# Patient Record
Sex: Female | Born: 1965 | Race: Black or African American | Hispanic: No | Marital: Single | Smoking: Current some day smoker
Health system: Southern US, Community
[De-identification: ages and names within clinical notes are randomized; demographics above are authoritative.]

## PROBLEM LIST (undated history)

## (undated) DIAGNOSIS — R5383 Other fatigue: Secondary | ICD-10-CM

## (undated) DIAGNOSIS — R519 Headache, unspecified: Secondary | ICD-10-CM

## (undated) DIAGNOSIS — F419 Anxiety disorder, unspecified: Secondary | ICD-10-CM

## (undated) DIAGNOSIS — R200 Anesthesia of skin: Secondary | ICD-10-CM

## (undated) DIAGNOSIS — R51 Headache: Secondary | ICD-10-CM

## (undated) DIAGNOSIS — R42 Dizziness and giddiness: Secondary | ICD-10-CM

## (undated) DIAGNOSIS — E785 Hyperlipidemia, unspecified: Secondary | ICD-10-CM

## (undated) DIAGNOSIS — Z720 Tobacco use: Secondary | ICD-10-CM

## (undated) DIAGNOSIS — C50919 Malignant neoplasm of unspecified site of unspecified female breast: Secondary | ICD-10-CM

## (undated) DIAGNOSIS — Z9289 Personal history of other medical treatment: Secondary | ICD-10-CM

## (undated) HISTORY — DX: Anesthesia of skin: R20.0

## (undated) HISTORY — DX: Anxiety disorder, unspecified: F41.9

## (undated) HISTORY — DX: Headache, unspecified: R51.9

## (undated) HISTORY — DX: Personal history of other medical treatment: Z92.89

## (undated) HISTORY — DX: Malignant neoplasm of unspecified site of unspecified female breast: C50.919

## (undated) HISTORY — PX: MASTECTOMY: SHX3

## (undated) HISTORY — DX: Hyperlipidemia, unspecified: E78.5

## (undated) HISTORY — DX: Tobacco use: Z72.0

## (undated) HISTORY — PX: CORNEAL TRANSPLANT: SHX108

## (undated) HISTORY — DX: Other fatigue: R53.83

## (undated) HISTORY — PX: EYE SURGERY: SHX253

## (undated) HISTORY — PX: BREAST SURGERY: SHX581

## (undated) HISTORY — DX: Headache: R51

## (undated) HISTORY — DX: Dizziness and giddiness: R42

---

## 1997-10-09 DIAGNOSIS — C50919 Malignant neoplasm of unspecified site of unspecified female breast: Secondary | ICD-10-CM

## 1997-10-09 HISTORY — DX: Malignant neoplasm of unspecified site of unspecified female breast: C50.919

## 1998-05-13 ENCOUNTER — Other Ambulatory Visit: Admission: RE | Admit: 1998-05-13 | Discharge: 1998-05-13 | Payer: Self-pay | Admitting: Obstetrics and Gynecology

## 1998-05-17 ENCOUNTER — Other Ambulatory Visit: Admission: RE | Admit: 1998-05-17 | Discharge: 1998-05-17 | Payer: Self-pay | Admitting: General Surgery

## 1998-05-31 ENCOUNTER — Ambulatory Visit (HOSPITAL_BASED_OUTPATIENT_CLINIC_OR_DEPARTMENT_OTHER): Admission: RE | Admit: 1998-05-31 | Discharge: 1998-05-31 | Payer: Self-pay | Admitting: General Surgery

## 1998-06-08 ENCOUNTER — Inpatient Hospital Stay (HOSPITAL_COMMUNITY): Admission: RE | Admit: 1998-06-08 | Discharge: 1998-06-09 | Payer: Self-pay | Admitting: General Surgery

## 1998-07-07 ENCOUNTER — Ambulatory Visit (HOSPITAL_COMMUNITY): Admission: RE | Admit: 1998-07-07 | Discharge: 1998-07-07 | Payer: Self-pay | Admitting: General Surgery

## 1998-11-30 ENCOUNTER — Other Ambulatory Visit: Admission: RE | Admit: 1998-11-30 | Discharge: 1998-11-30 | Payer: Self-pay | Admitting: Gynecology

## 1999-01-03 ENCOUNTER — Other Ambulatory Visit: Admission: RE | Admit: 1999-01-03 | Discharge: 1999-01-03 | Payer: Self-pay | Admitting: Obstetrics and Gynecology

## 1999-01-04 ENCOUNTER — Ambulatory Visit (HOSPITAL_COMMUNITY): Admission: RE | Admit: 1999-01-04 | Discharge: 1999-01-04 | Payer: Self-pay | Admitting: General Surgery

## 1999-01-13 ENCOUNTER — Ambulatory Visit (HOSPITAL_COMMUNITY): Admission: RE | Admit: 1999-01-13 | Discharge: 1999-01-13 | Payer: Self-pay | Admitting: Obstetrics and Gynecology

## 1999-09-09 ENCOUNTER — Other Ambulatory Visit: Admission: RE | Admit: 1999-09-09 | Discharge: 1999-09-09 | Payer: Self-pay | Admitting: Obstetrics and Gynecology

## 1999-12-15 ENCOUNTER — Encounter (INDEPENDENT_AMBULATORY_CARE_PROVIDER_SITE_OTHER): Payer: Self-pay | Admitting: Specialist

## 1999-12-15 ENCOUNTER — Other Ambulatory Visit: Admission: RE | Admit: 1999-12-15 | Discharge: 1999-12-15 | Payer: Self-pay | Admitting: Obstetrics and Gynecology

## 2000-04-18 ENCOUNTER — Other Ambulatory Visit: Admission: RE | Admit: 2000-04-18 | Discharge: 2000-04-18 | Payer: Self-pay | Admitting: Gynecology

## 2000-08-05 ENCOUNTER — Emergency Department (HOSPITAL_COMMUNITY): Admission: EM | Admit: 2000-08-05 | Discharge: 2000-08-05 | Payer: Self-pay | Admitting: Emergency Medicine

## 2000-09-11 ENCOUNTER — Encounter: Admission: RE | Admit: 2000-09-11 | Discharge: 2000-09-11 | Payer: Self-pay | Admitting: Oncology

## 2000-09-11 ENCOUNTER — Encounter: Payer: Self-pay | Admitting: Oncology

## 2001-03-13 ENCOUNTER — Other Ambulatory Visit: Admission: RE | Admit: 2001-03-13 | Discharge: 2001-03-13 | Payer: Self-pay | Admitting: Gynecology

## 2003-05-08 ENCOUNTER — Encounter: Payer: Self-pay | Admitting: Oncology

## 2003-05-08 ENCOUNTER — Ambulatory Visit (HOSPITAL_COMMUNITY): Admission: RE | Admit: 2003-05-08 | Discharge: 2003-05-08 | Payer: Self-pay | Admitting: Oncology

## 2003-05-13 ENCOUNTER — Encounter: Payer: Self-pay | Admitting: Oncology

## 2003-05-13 ENCOUNTER — Encounter: Admission: RE | Admit: 2003-05-13 | Discharge: 2003-05-13 | Payer: Self-pay | Admitting: Oncology

## 2003-05-19 ENCOUNTER — Encounter (INDEPENDENT_AMBULATORY_CARE_PROVIDER_SITE_OTHER): Payer: Self-pay | Admitting: General Surgery

## 2003-05-19 ENCOUNTER — Encounter (INDEPENDENT_AMBULATORY_CARE_PROVIDER_SITE_OTHER): Payer: Self-pay | Admitting: Specialist

## 2003-05-19 ENCOUNTER — Ambulatory Visit (HOSPITAL_BASED_OUTPATIENT_CLINIC_OR_DEPARTMENT_OTHER): Admission: RE | Admit: 2003-05-19 | Discharge: 2003-05-19 | Payer: Self-pay | Admitting: General Surgery

## 2003-06-04 ENCOUNTER — Ambulatory Visit (HOSPITAL_BASED_OUTPATIENT_CLINIC_OR_DEPARTMENT_OTHER): Admission: RE | Admit: 2003-06-04 | Discharge: 2003-06-05 | Payer: Self-pay | Admitting: General Surgery

## 2003-06-04 ENCOUNTER — Encounter (INDEPENDENT_AMBULATORY_CARE_PROVIDER_SITE_OTHER): Payer: Self-pay | Admitting: *Deleted

## 2003-06-04 ENCOUNTER — Encounter: Payer: Self-pay | Admitting: General Surgery

## 2003-06-18 ENCOUNTER — Ambulatory Visit: Admission: RE | Admit: 2003-06-18 | Discharge: 2003-06-18 | Payer: Self-pay | Admitting: Oncology

## 2003-06-18 ENCOUNTER — Encounter: Payer: Self-pay | Admitting: Cardiology

## 2003-10-31 ENCOUNTER — Emergency Department (HOSPITAL_COMMUNITY): Admission: EM | Admit: 2003-10-31 | Discharge: 2003-11-01 | Payer: Self-pay | Admitting: Emergency Medicine

## 2003-12-14 ENCOUNTER — Encounter: Admission: RE | Admit: 2003-12-14 | Discharge: 2003-12-14 | Payer: Self-pay | Admitting: General Surgery

## 2003-12-15 ENCOUNTER — Ambulatory Visit (HOSPITAL_BASED_OUTPATIENT_CLINIC_OR_DEPARTMENT_OTHER): Admission: RE | Admit: 2003-12-15 | Discharge: 2003-12-15 | Payer: Self-pay | Admitting: General Surgery

## 2003-12-15 ENCOUNTER — Ambulatory Visit (HOSPITAL_COMMUNITY): Admission: RE | Admit: 2003-12-15 | Discharge: 2003-12-15 | Payer: Self-pay | Admitting: General Surgery

## 2004-02-08 ENCOUNTER — Encounter: Admission: RE | Admit: 2004-02-08 | Discharge: 2004-03-23 | Payer: Self-pay | Admitting: General Surgery

## 2004-03-09 ENCOUNTER — Ambulatory Visit: Admission: RE | Admit: 2004-03-09 | Discharge: 2004-03-09 | Payer: Self-pay | Admitting: Cardiology

## 2004-03-09 ENCOUNTER — Encounter (INDEPENDENT_AMBULATORY_CARE_PROVIDER_SITE_OTHER): Payer: Self-pay | Admitting: Cardiology

## 2004-09-12 ENCOUNTER — Other Ambulatory Visit: Admission: RE | Admit: 2004-09-12 | Discharge: 2004-09-12 | Payer: Self-pay | Admitting: Obstetrics and Gynecology

## 2004-10-09 HISTORY — PX: ABDOMINAL HYSTERECTOMY: SHX81

## 2004-10-26 ENCOUNTER — Ambulatory Visit (HOSPITAL_COMMUNITY): Admission: RE | Admit: 2004-10-26 | Discharge: 2004-10-26 | Payer: Self-pay | Admitting: Obstetrics and Gynecology

## 2005-01-09 ENCOUNTER — Encounter: Admission: RE | Admit: 2005-01-09 | Discharge: 2005-04-09 | Payer: Self-pay | Admitting: General Surgery

## 2005-05-29 ENCOUNTER — Ambulatory Visit: Payer: Self-pay | Admitting: Oncology

## 2005-09-04 ENCOUNTER — Ambulatory Visit: Payer: Self-pay | Admitting: Oncology

## 2005-11-10 ENCOUNTER — Ambulatory Visit: Payer: Self-pay | Admitting: Oncology

## 2006-03-15 ENCOUNTER — Ambulatory Visit: Payer: Self-pay | Admitting: Oncology

## 2006-05-28 ENCOUNTER — Ambulatory Visit: Payer: Self-pay | Admitting: Hematology & Oncology

## 2006-07-19 ENCOUNTER — Ambulatory Visit: Payer: Self-pay | Admitting: Oncology

## 2006-09-05 ENCOUNTER — Ambulatory Visit: Payer: Self-pay | Admitting: Oncology

## 2006-10-15 ENCOUNTER — Emergency Department (HOSPITAL_COMMUNITY): Admission: EM | Admit: 2006-10-15 | Discharge: 2006-10-16 | Payer: Self-pay | Admitting: Emergency Medicine

## 2006-10-17 ENCOUNTER — Emergency Department (HOSPITAL_COMMUNITY): Admission: EM | Admit: 2006-10-17 | Discharge: 2006-10-17 | Payer: Self-pay | Admitting: Family Medicine

## 2006-11-15 ENCOUNTER — Ambulatory Visit: Payer: Self-pay | Admitting: Oncology

## 2006-11-20 LAB — CBC WITH DIFFERENTIAL/PLATELET
Eosinophils Absolute: 0.4 10*3/uL (ref 0.0–0.5)
HCT: 40 % (ref 34.8–46.6)
HGB: 14.4 g/dL (ref 11.6–15.9)
LYMPH%: 30.9 % (ref 14.0–48.0)
MONO#: 0.3 10*3/uL (ref 0.1–0.9)
NEUT#: 3.1 10*3/uL (ref 1.5–6.5)
NEUT%: 56.9 % (ref 39.6–76.8)
Platelets: 244 10*3/uL (ref 145–400)
WBC: 5.5 10*3/uL (ref 3.9–10.0)
lymph#: 1.7 10*3/uL (ref 0.9–3.3)

## 2007-02-22 ENCOUNTER — Ambulatory Visit (HOSPITAL_COMMUNITY): Admission: RE | Admit: 2007-02-22 | Discharge: 2007-02-23 | Payer: Self-pay | Admitting: Obstetrics and Gynecology

## 2007-02-22 ENCOUNTER — Encounter (INDEPENDENT_AMBULATORY_CARE_PROVIDER_SITE_OTHER): Payer: Self-pay | Admitting: Obstetrics and Gynecology

## 2007-11-08 ENCOUNTER — Ambulatory Visit: Payer: Self-pay | Admitting: Oncology

## 2007-11-12 LAB — CBC WITH DIFFERENTIAL/PLATELET
Basophils Absolute: 0.1 10*3/uL (ref 0.0–0.1)
Eosinophils Absolute: 0.6 10*3/uL — ABNORMAL HIGH (ref 0.0–0.5)
HCT: 41.5 % (ref 34.8–46.6)
LYMPH%: 35.5 % (ref 14.0–48.0)
MCV: 89.2 fL (ref 81.0–101.0)
MONO%: 5.9 % (ref 0.0–13.0)
NEUT#: 2.7 10*3/uL (ref 1.5–6.5)
NEUT%: 46.4 % (ref 39.6–76.8)
Platelets: 259 10*3/uL (ref 145–400)
RBC: 4.66 10*6/uL (ref 3.70–5.32)

## 2007-11-12 LAB — COMPREHENSIVE METABOLIC PANEL
Alkaline Phosphatase: 89 U/L (ref 39–117)
BUN: 10 mg/dL (ref 6–23)
Creatinine, Ser: 0.94 mg/dL (ref 0.40–1.20)
Glucose, Bld: 89 mg/dL (ref 70–99)
Sodium: 143 mEq/L (ref 135–145)
Total Bilirubin: 0.4 mg/dL (ref 0.3–1.2)

## 2008-01-13 ENCOUNTER — Encounter: Admission: RE | Admit: 2008-01-13 | Discharge: 2008-01-13 | Payer: Self-pay | Admitting: Occupational Medicine

## 2008-01-14 ENCOUNTER — Ambulatory Visit: Payer: Self-pay | Admitting: Oncology

## 2008-01-14 ENCOUNTER — Ambulatory Visit (HOSPITAL_COMMUNITY): Admission: RE | Admit: 2008-01-14 | Discharge: 2008-01-14 | Payer: Self-pay | Admitting: Oncology

## 2008-01-14 LAB — COMPREHENSIVE METABOLIC PANEL
Alkaline Phosphatase: 95 U/L (ref 39–117)
Glucose, Bld: 81 mg/dL (ref 70–99)
Sodium: 138 mEq/L (ref 135–145)
Total Bilirubin: 0.6 mg/dL (ref 0.3–1.2)
Total Protein: 7.1 g/dL (ref 6.0–8.3)

## 2008-10-27 ENCOUNTER — Encounter: Admission: RE | Admit: 2008-10-27 | Discharge: 2008-12-07 | Payer: Self-pay | Admitting: General Surgery

## 2008-11-06 ENCOUNTER — Ambulatory Visit: Payer: Self-pay | Admitting: Oncology

## 2008-11-10 LAB — COMPREHENSIVE METABOLIC PANEL
ALT: 18 U/L (ref 0–35)
AST: 15 U/L (ref 0–37)
Alkaline Phosphatase: 103 U/L (ref 39–117)
BUN: 11 mg/dL (ref 6–23)
Chloride: 106 mEq/L (ref 96–112)
Creatinine, Ser: 1.13 mg/dL (ref 0.40–1.20)
Total Bilirubin: 0.4 mg/dL (ref 0.3–1.2)

## 2008-11-10 LAB — CBC WITH DIFFERENTIAL/PLATELET
BASO%: 0.6 % (ref 0.0–2.0)
Basophils Absolute: 0 10*3/uL (ref 0.0–0.1)
EOS%: 8.2 % — ABNORMAL HIGH (ref 0.0–7.0)
HGB: 13.8 g/dL (ref 11.6–15.9)
LYMPH%: 33.5 % (ref 14.0–48.0)
MONO%: 8.8 % (ref 0.0–13.0)
NEUT#: 2.6 10*3/uL (ref 1.5–6.5)
NEUT%: 48.9 % (ref 39.6–76.8)
Platelets: 216 10*3/uL (ref 145–400)
RBC: 4.35 10*6/uL (ref 3.70–5.32)
WBC: 5.3 10*3/uL (ref 3.9–10.0)
lymph#: 1.8 10*3/uL (ref 0.9–3.3)

## 2008-11-16 ENCOUNTER — Ambulatory Visit: Payer: Self-pay | Admitting: Genetic Counselor

## 2009-11-24 ENCOUNTER — Ambulatory Visit: Payer: Self-pay | Admitting: Genetic Counselor

## 2010-04-20 LAB — CONVERTED CEMR LAB: Pap Smear: NORMAL

## 2010-06-16 ENCOUNTER — Emergency Department (HOSPITAL_COMMUNITY): Admission: EM | Admit: 2010-06-16 | Discharge: 2010-06-16 | Payer: Self-pay | Admitting: Family Medicine

## 2010-06-27 ENCOUNTER — Ambulatory Visit (HOSPITAL_COMMUNITY): Admission: RE | Admit: 2010-06-27 | Discharge: 2010-06-27 | Payer: Self-pay | Admitting: Orthopaedic Surgery

## 2010-08-16 ENCOUNTER — Ambulatory Visit: Payer: Self-pay | Admitting: Family Medicine

## 2010-10-11 ENCOUNTER — Ambulatory Visit
Admission: RE | Admit: 2010-10-11 | Discharge: 2010-10-11 | Payer: Self-pay | Source: Home / Self Care | Attending: Family Medicine | Admitting: Family Medicine

## 2010-10-11 DIAGNOSIS — Z853 Personal history of malignant neoplasm of breast: Secondary | ICD-10-CM | POA: Insufficient documentation

## 2010-10-11 DIAGNOSIS — F17200 Nicotine dependence, unspecified, uncomplicated: Secondary | ICD-10-CM | POA: Insufficient documentation

## 2010-10-12 ENCOUNTER — Telehealth: Payer: Self-pay | Admitting: Family Medicine

## 2010-10-12 LAB — CONVERTED CEMR LAB
AST: 15 units/L (ref 0–37)
Alkaline Phosphatase: 94 units/L (ref 39–117)
BUN: 11 mg/dL (ref 6–23)
Basophils Absolute: 0 10*3/uL (ref 0.0–0.1)
Bilirubin, Direct: 0.1 mg/dL (ref 0.0–0.3)
Calcium: 9.9 mg/dL (ref 8.4–10.5)
Cholesterol: 185 mg/dL (ref 0–200)
Creatinine, Ser: 0.9 mg/dL (ref 0.4–1.2)
Eosinophils Relative: 4.1 % (ref 0.0–5.0)
GFR calc non Af Amer: 88.49 mL/min (ref >60.00)
Glucose, Bld: 59 mg/dL — ABNORMAL LOW (ref 70–99)
HDL: 34.4 mg/dL — ABNORMAL LOW (ref >39.00)
LDL Cholesterol: 126 mg/dL — ABNORMAL HIGH (ref 0–99)
Lymphocytes Relative: 23.8 % (ref 12.0–46.0)
Lymphs Abs: 1.8 10*3/uL (ref 0.7–4.0)
Monocytes Relative: 7.1 % (ref 3.0–12.0)
Neutrophils Relative %: 64.8 % (ref 43.0–77.0)
Platelets: 255 10*3/uL (ref 150.0–400.0)
RDW: 13.3 % (ref 11.5–14.6)
TSH: 0.51 microintl units/mL (ref 0.35–5.50)
Total Bilirubin: 0.6 mg/dL (ref 0.3–1.2)
VLDL: 24.6 mg/dL (ref 0.0–40.0)
WBC: 7.5 10*3/uL (ref 4.5–10.5)

## 2010-10-31 ENCOUNTER — Other Ambulatory Visit: Payer: Self-pay | Admitting: Family Medicine

## 2010-10-31 ENCOUNTER — Encounter (INDEPENDENT_AMBULATORY_CARE_PROVIDER_SITE_OTHER): Payer: Self-pay | Admitting: *Deleted

## 2010-10-31 ENCOUNTER — Ambulatory Visit
Admission: RE | Admit: 2010-10-31 | Discharge: 2010-10-31 | Payer: Self-pay | Source: Home / Self Care | Attending: Family Medicine | Admitting: Family Medicine

## 2010-11-08 NOTE — Assessment & Plan Note (Signed)
Summary: FLU SHOT//PH  .  Other Orders: Admin 1st Vaccine (24401) Flu Vaccine 52yrs + [phone])   Orders Added: 1)  Admin 1st Vaccine [90471] 2)  Flu Vaccine 59yrs + [36644] Flu Vaccine Consent Questions     Do you have a history of severe allergic reactions to this vaccine? no    Any prior history of allergic reactions to egg and/or gelatin? no    Do you have a sensitivity to the preservative Thimersol? no    Do you have a past history of Guillan-Barre Syndrome? no    Do you currently have an acute febrile illness? no    Have you ever had a severe reaction to latex? no    Vaccine information given and explained to patient? yes    Are you currently pregnant? no    Lot Number:[id number]   Exp Date:04/08/2011   Site Given  Left Deltoid IM 2)  Flu Vaccine 53yrs + [03474]   .[email or web address]

## 2010-11-10 NOTE — Letter (Signed)
Summary: [facility]: Immunoassay Fecal Occult Blood (iFOB) Order Form  [facility] [place]  [address] [place], [place] [address]   Phone: [phone]  Fax: [phone]      [facility]: Immunoassay Fecal Occult Blood (iFOB) Order Form   October 31, 2010 MRN: [id number]   VYLA PINT [birth year]   Physicican Name:______Dr Laury Axon, DO___________________  Diagnosis Code:_____V76.51_____________________      Jeremy Johann CMA

## 2010-11-10 NOTE — Assessment & Plan Note (Signed)
Summary: new to est//lch   Vital Signs:  Patient profile:   45 year old female Menstrual status:  hysterectomy Height:      66.5 inches Weight:      123.4 pounds BMI:     19.69 Temp:     98.0 degrees F oral Pulse rate:   76 / minute Pulse rhythm:   regular BP sitting:   92 / 60  (right arm) Cuff size:   regular  Vitals Entered By: Almeta Monas CMA Duncan Dull) (October 11, 2010 10:10 AM) CC: New Est Care--- wants to discuss quitting smoking and wants cholesteroll checked     Menstrual Status hysterectomy Last PAP Result normal   History of Present Illness: Pt here to establish.  She wants to quit smoking but is not ready for chantix.   She would also like her cholesterol checked.     Preventive Screening-Counseling & Management  Alcohol-Tobacco     Alcohol drinks/day: 0     Smoking Status: current     Smoking Cessation Counseling: yes     Smoke Cessation Stage: ready     Packs/Day: 0.5     Year Started: 1988  Caffeine-Diet-Exercise     Does Patient Exercise: no      Drug Use:  no.    Problems Prior to Update: 1)  Preventive Health Care  (ICD-V70.0) 2)  Family History of Aneurysm Aortic  (ICD-V17.4) 3)  Breast Cancer, Hx of  (ICD-V10.3)  Medications Prior to Update: 1)  None  Current Medications (verified): 1)  None  Allergies (verified): 1)  ! 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 Family History of Aneurysm Aortic Family History of Stroke F 1st degree relative <60 Family History Hypertension  Social History: Reviewed history and no changes required. 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 Lungs are clear to auscultation, no crackles or wheezes. Heart:  normal rate and no murmur.   Psych:  Oriented X3, normally interactive, good eye contact, not anxious appearing, and not depressed appearing.     Impression & Recommendations:  Problem # 1:  TOBACCO USE (ICD-305.1)  samples of lozenges and patches given consider therapy for hypnosis Encouraged smoking cessation and discussed different methods for smoking cessation.   Orders: Psychology Referral (Psychology) Tobacco use cessation intermediate 3-10 minutes (57846)  Other Orders: Venipuncture ([address]) TLB-Lipid Panel (80061-LIPID) TLB-BMP (Basic Metabolic Panel-BMET) (80048-METABOL) TLB-CBC Platelet - w/Differential (85025-CBCD) TLB-Hepatic/Liver Function Pnl (80076-HEPATIC) TLB-TSH (Thyroid Stimulating Hormone) (84443-TSH) Specimen Handling (28413)  Patient Instructions: 1)  Rto CPE     Orders Added:  1)  Venipuncture [36415] 2)  TLB-Lipid Panel [80061-LIPID] 3)  TLB-BMP (Basic Metabolic Panel-BMET) [80048-METABOL] 4)  TLB-CBC Platelet - w/Differential [85025-CBCD] 5)  TLB-Hepatic/Liver Function Pnl [80076-HEPATIC] 6)  TLB-TSH (Thyroid  Stimulating Hormone) [84443-TSH] 7)  Specimen Handling [99000] 8)  Psychology Referral [Psychology] 9)  New Patient Level II [99202] 10)  Tobacco use cessation intermediate 3-10 minutes [99406]   Immunization History:  Tetanus/Td Immunization History:    Tetanus/Td:  historical (10/09/2009)   Immunization History:  Tetanus/Td Immunization History:    Tetanus/Td:  Historical (10/09/2009)   PAP Result Date:  04/20/2010 PAP Result:  normal PAP Next Due:  1 yr Mammogram Next Due:  Not Indicated

## 2010-11-10 NOTE — Progress Notes (Signed)
Summary: needs RX for smokng Cessation  Phone Note Call from Patient   Caller: Patient Call For: Loreen Freud DO Details for Reason: Smoking Cessation Summary of Call: call from patient and she stated she needs and Rx sent to the pharmacy for her smoking cessation. Stated she was told it would be free, all she needed was the RX and would like it sent to the [facility]. c/b # [id number].Marland KitchenMarland KitchenMarland KitchenMarland Kitchenplease advise  Initial call taken by: Almeta Monas CMA Duncan Dull),  October 12, 2010 12:04 PM  Follow-up for Phone Call        nicoderm CQ  21 mg patch once daily x6weeks   nicoderm CQ 14 mg patch once daily for 2 weeks then 7 mg patch once daily for 2 weeks   Follow-up by: Loreen Freud DO,  October 12, 2010 12:20 PM  Additional Follow-up for Phone Call Additional follow up Details #1::        rx's have been sent to the pharmacy. Pt made aware and I clarified the RX and advised the patient she will start with the 21mg  Nicoderm and work her way down. Note to pharmacist to hold the other Rx's until patient is ready to pick them up..... Almeta Monas CMA Duncan Dull)  October 12, 2010 1:47 PM' Additional Follow-up by: Almeta Monas CMA Duncan Dull),  October 12, 2010 1:47 PM    New/Updated Medications: NICODERM CQ 21 MG/24HR PT24 (NICOTINE) apply one patch once daily x6 weeks NICODERM CQ 14 MG/24HR PT24 (NICOTINE) apply 1 patch once a day x2weeks NICODERM CQ 7 MG/24HR PT24 (NICOTINE) apply 1 patch once daily x 2weeks Prescriptions: NICODERM CQ 7 MG/24HR PT24 (NICOTINE) apply 1 patch once daily x 2weeks  #14 x 0   Entered by:   Almeta Monas CMA (AAMA)   Authorized by:   Loreen Freud DO   Signed by:   Almeta Monas CMA (AAMA) on 10/12/2010   Method used:   Faxed to ...       [facility]* (retail)       [address]       [address] Shipping/mailing       [place], [place]  [address]       Ph: [phone]       Fax: [phone]   RxID:   [id number] NICODERM CQ 14  MG/24HR PT24 (NICOTINE) apply 1 patch once a day x2weeks  #14 x 0   Entered by:   Almeta Monas CMA (AAMA)   Authorized by:   Loreen Freud DO   Signed by:   Almeta Monas CMA (AAMA) on 10/12/2010   Method used:   Faxed to ...       [facility]* (retail)       [address]       [address] Shipping/mailing       [place], [place]  [address]       Ph: [phone]       Fax: [phone]   RxID:   [id number] NICODERM CQ 21 MG/24HR PT24 (NICOTINE) apply one patch once daily x6 weeks  #42 x 0   Entered by:   Almeta Monas CMA (AAMA)   Authorized by:   Loreen Freud DO   Signed by:   Almeta Monas CMA (AAMA) on 10/12/2010   Method used:   Faxed to ...       [facility]* (retail)  [address]       [address] Shipping/mailing       [place], [place]  [address]       Ph: [phone]       Fax: [phone]   RxID:   [id number]

## 2010-11-22 ENCOUNTER — Ambulatory Visit (INDEPENDENT_AMBULATORY_CARE_PROVIDER_SITE_OTHER): Payer: Commercial Managed Care - PPO | Admitting: Family Medicine

## 2010-11-22 ENCOUNTER — Encounter: Payer: Self-pay | Admitting: Family Medicine

## 2010-11-22 DIAGNOSIS — R109 Unspecified abdominal pain: Secondary | ICD-10-CM

## 2010-11-22 DIAGNOSIS — G43909 Migraine, unspecified, not intractable, without status migrainosus: Secondary | ICD-10-CM

## 2010-11-22 DIAGNOSIS — N390 Urinary tract infection, site not specified: Secondary | ICD-10-CM

## 2010-11-22 LAB — CONVERTED CEMR LAB
Blood in Urine, dipstick: NEGATIVE
Ketones, urine, test strip: NEGATIVE
Nitrite: NEGATIVE
Protein, U semiquant: NEGATIVE
pH: 6.5

## 2010-11-23 ENCOUNTER — Encounter: Payer: Self-pay | Admitting: Family Medicine

## 2010-11-30 NOTE — Assessment & Plan Note (Signed)
Summary: back pain/cbs   Vital Signs:  Patient profile:   45 year old female Menstrual status:  hysterectomy Weight:      125.0 pounds Temp:     98.3 degrees F oral BP sitting:   112 / 72  (right arm) Cuff size:   regular  Vitals Entered By: Almeta Monas CMA Duncan Dull) (November 22, 2010 3:39 PM) CC: x few days c/o pain to the left side of the lower back with some redness--worst when water hits it    History of Present Illness: Pt here c/o tenderness L side.  It hurts with light touch.  No urinary symptoms.    Current Medications (verified): 1)  Nicoderm Cq 21 Mg/24hr Pt24 (Nicotine) .... Apply One Patch Once Daily X6 Weeks 2)  Nicoderm Cq 14 Mg/24hr Pt24 (Nicotine) .... Apply 1 Patch Once A Day X2weeks 3)  Nicoderm Cq 7 Mg/24hr Pt24 (Nicotine) .... Apply 1 Patch Once Daily X 2weeks 4)  Macrobid 100 Mg Caps (Nitrofurantoin Monohyd Macro) .Marland Kitchen.. 1 By Mouth Two Times A Day 5)  Valtrex 1 Gm Tabs (Valacyclovir Hcl) .Marland Kitchen.. 1 By Mouth Three Times A Day 6)  Vicodin 5-500 Mg Tabs (Hydrocodone-Acetaminophen) .Marland Kitchen.. 1-2 By Mouth Every 6 Hours As Needed  Allergies (verified): 1)  ! 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 Family  History of Aneurysm Aortic Family History of Stroke F 1st degree relative <60 Family History Hypertension  Social History: Reviewed history from 10/11/2010 and no changes required. 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   Neurologic:  alert & oriented X3, gait normal, and DTRs symmetrical and normal.   Skin:  no rash Psych:  Oriented X3 and normally interactive.     Impression & Recommendations:  Problem # 1:  UTI (ICD-599.0)  Her updated medication list for this problem includes:    Macrobid 100 Mg Caps (Nitrofurantoin monohyd macro) .Marland Kitchen... 1 by mouth two times a day  Orders: Specimen Handling (47829) T-Culture, Urine (56213-08657) UA Dipstick w/o Micro (manual) (81002)  Encouraged to push clear liquids, get enough rest, and take acetaminophen as needed. To be seen in 10 days if no improvement, sooner if worse.  Problem # 2:  FLANK PAIN, LEFT (ICD-789.09)  ? shingles---rx valtrex Her updated medication list for this problem includes:    Vicodin 5-500 Mg Tabs (Hydrocodone-acetaminophen) .Marland Kitchen... 1-2 by mouth every 6 hours as needed  Discussed use of medications, application of heat or cold, and exercises.   Orders: UA Dipstick w/o Micro (manual) (84696)  Complete Medication List: 1)  Nicoderm Cq 21 Mg/24hr Pt24 (Nicotine) .... Apply one patch once daily x6 weeks 2)  Nicoderm Cq 14  Mg/24hr Pt24 (Nicotine) .... Apply 1 patch once a day x2weeks 3)  Nicoderm Cq 7 Mg/24hr Pt24 (Nicotine) .... Apply 1 patch once daily x 2weeks 4)  Macrobid 100 Mg Caps (Nitrofurantoin monohyd macro) .Marland Kitchen.. 1 by mouth two times a day 5)  Valtrex 1 Gm Tabs (Valacyclovir hcl) .Marland Kitchen.. 1 by mouth three times a day 6)  Vicodin 5-500  Mg Tabs (Hydrocodone-acetaminophen) .Marland Kitchen.. 1-2 by mouth every 6 hours as needed Prescriptions: VICODIN 5-500 MG TABS (HYDROCODONE-ACETAMINOPHEN) 1-2 by mouth every 6 hours as needed  #30 x 0   Entered and Authorized by:   Loreen Freud DO   Signed by:   Loreen Freud DO on 11/22/2010   Method used:   Print then Give to Patient   RxID:   [id number] VALTREX 1 GM TABS (VALACYCLOVIR HCL) 1 by mouth three times a day  #30 x 0   Entered and Authorized by:   Loreen Freud DO   Signed by:   Loreen Freud DO on 11/22/2010   Method used:   Electronically to        [facility]* (retail)       [address]       [address] Shipping/mailing       [place], [place]  [address]       Ph: [phone]       Fax: [phone]   RxID:   [id number] MACROBID 100 MG CAPS (NITROFURANTOIN MONOHYD MACRO) 1 by mouth two times a day  #14 x 0   Entered and Authorized by:   Loreen Freud DO   Signed by:   Loreen Freud DO on 11/22/2010   Method used:   Print then Give to Patient   RxID:   [id number]    Orders Added: 1)  Specimen Handling [99000] 2)  T-Culture, Urine [16010-93235] 3)  Est. Patient Level III [57322] 4)  UA Dipstick w/o Micro (manual) [81002]    Laboratory Results   Urine Tests   Date/Time Reported: November 22, 2010 4:10 PM   Routine Urinalysis   Color: yellow Appearance: Clear Glucose: negative   (Normal Range: Negative) Bilirubin: negative   (Normal Range: Negative) Ketone: negative   (Normal Range: Negative) Spec. Gravity: <1.005   (Normal Range: 1.003-1.035) Blood: negative   (Normal Range: Negative) pH: 6.5   (Normal Range: 5.0-8.0) Protein: negative   (Normal Range: Negative) Urobilinogen: 0.2   (Normal Range: 0-1) Nitrite: negative   (Normal Range: Negative) Leukocyte Esterace: large   (Normal Range: Negative)

## 2011-02-03 ENCOUNTER — Encounter: Payer: Self-pay | Admitting: Family Medicine

## 2011-02-07 ENCOUNTER — Encounter: Payer: Self-pay | Admitting: Family Medicine

## 2011-02-07 ENCOUNTER — Ambulatory Visit (INDEPENDENT_AMBULATORY_CARE_PROVIDER_SITE_OTHER): Payer: Commercial Managed Care - PPO | Admitting: Family Medicine

## 2011-02-07 DIAGNOSIS — G43909 Migraine, unspecified, not intractable, without status migrainosus: Secondary | ICD-10-CM

## 2011-02-07 MED ORDER — CYCLOBENZAPRINE HCL 10 MG PO TABS: 10.0000 mg | ORAL_TABLET | Freq: Three times a day (TID) | ORAL | Status: AC | PRN

## 2011-02-07 MED ORDER — CYCLOBENZAPRINE HCL 10 MG PO TABS: 10.0000 mg | ORAL_TABLET | Freq: Three times a day (TID) | ORAL | Status: DC | PRN

## 2011-02-07 NOTE — Progress Notes (Signed)
  Subjective:    [name] is a 45 y.o. female who presents for evaluation of headache. Symptoms began about 9 days ago. Generally, the headaches last about 2 hours and occur every day. The headaches do not seem to be related to any time of the day. The headaches are usually moderate and dull and are located in back of the head.  The patient rates her most severe headaches a 6 on a scale from 1 to 10. Recently, the headaches have been stable. Work attendance or other daily activities are not affected by the headaches. Precipitating factors include: none which have been determined. The headaches are usually not preceded by an aura. Associated neurologic symptoms: none. The patient denies decreased physical activity, depression, dizziness, loss of balance, muscle weakness, numbness of extremities, speech difficulties, vision problems, vomiting in the early morning and worsening school/work performance. Home treatment has included ibuprofen, hydrocodone, darkening the room, resting and sleeping with some improvement. Other history includes: nothing pertinent. Family history includes stroke and aneurysm.  The following portions of the patient's history were reviewed and updated as appropriate: allergies, current medications, past family history, past medical history, past social history, past surgical history and problem list.  Review of Systems Pertinent items are noted in HPI.    Objective:    BP 112/76  Pulse 96  Wt 124 lb 12.8 oz (56.609 kg) General appearance: alert, cooperative, appears stated age and mild distress Head: Normocephalic, without obvious abnormality, atraumatic Eyes: conjunctivae/corneas clear. PERRL, EOM's intact. Fundi benign. Ears: normal TM's and external ear canals both ears Nose: Nares normal. Septum midline. Mucosa normal. No drainage or sinus tenderness. Neck: no adenopathy, no carotid bruit, no JVD, supple, symmetrical, trachea midline and thyroid not enlarged,  symmetric, no tenderness/mass/nodules Lungs: clear to auscultation bilaterally Heart: regular rate and rhythm, S1, S2 normal, no murmur, click, rub or gallop Extremities: extremities normal, atraumatic, no cyanosis or edema Pulses: 2+ and symmetric Lymph nodes: Cervical, supraclavicular, and axillary nodes normal. Neurologic: Grossly normal    Assessment:    Classic migraine    Plan:    Lie in darkened room and apply cold packs as needed for pain. Side effect profile discussed in detail. Asked to keep headache diary. Patient reassured that neurodiagnostic workup not indicated from benign H&P. Neurodiagnostic workup: ESR to r/o temporal arteritis and MRI to r/o aneurysm. Follow up in 1 month.

## 2011-02-07 NOTE — Patient Instructions (Signed)
Headache, General, Without Cause   The specific cause of your headache may not have been found today. There are many causes and types of headache. A few common ones are:   Tension headache.   Migraine.  Infections (examples: dental and sinus infections).   Bone and/or joint problems in the neck or jaw.  Depression.   Eye problems.    These headaches are not life threatening.   Headaches can sometimes be diagnosed by a patient history and a physical exam. Sometimes, lab and imaging studies (such as x-ray and/or CT scan) are used to rule out more serious problems. In some cases, a spinal tap (lumbar puncture) may be requested. There are many times when your exam and tests may be normal on the first visit even when there is a serious problem causing your headaches. Because of that, it is very important to follow up with your doctor or local clinic for further evaluation.   HOME CARE INSTRUCTIONS   Keep follow-up appointments with your caregiver, or any specialist referral.   Only take over-the-counter or prescription medicines for pain, discomfort, or fever as directed by your caregiver.   Biofeedback, massage, or other relaxation techniques may be helpful.   Ice packs or heat applied to the head and neck can be used. Do this three to four times per day, or as needed.   Call your doctor if you have any questions or concerns.   If you smoke, you should quit.   FINDING OUT THE RESULTS OF TESTS   If a radiology test was performed, a radiologist will review your results.   You will be contacted by the emergency department or your physician if any test results require a change in your treatment plan.   Not all test results may be available during your visit. If your test results are not back during the visit, make an appointment with your caregiver to find out the results. Do not assume everything is normal if you have not heard from your caregiver or the medical facility. It is important for you to follow up on all of your  test results.   SEEK MEDICAL CARE IF:   You develop problems with medications prescribed.   You do not respond to or obtain relief from medications.   You have a change from the usual headache.   You develop nausea or vomiting.   SEEK IMMEDIATE MEDICAL CARE IF:   If your headache becomes severe.   You have an unexplained oral temperature above 100.4, or as your caregiver suggests.   You have a stiff neck.   You have loss of vision.   You have muscular weakness.   You have loss of muscular control.   You develop severe symptoms different from your first symptoms.   You start losing your balance or have trouble walking.   You feel faint or pass out.   MAKE SURE YOU:   Understand these instructions.   Will watch your condition.   Will get help right away if you are not doing well or get worse.   Document Released: 09/25/2005 Document Re-Released: 09/07/2008   [employer]® Patient Information ©2011 [employer].

## 2011-02-09 NOTE — Progress Notes (Signed)
  Subjective:    Patient ID: [name], female    DOB: 1966-08-29, 45 y.o.   MRN: [id number]  HPI    Review of Systems     Objective:   Physical Exam        Assessment & Plan:

## 2011-02-14 ENCOUNTER — Ambulatory Visit (HOSPITAL_COMMUNITY)
Admission: RE | Admit: 2011-02-14 | Discharge: 2011-02-14 | Disposition: A | Discharge status: Home / Self Care | Payer: Commercial Managed Care - PPO | Source: Ambulatory Visit | Attending: Family Medicine | Admitting: Family Medicine

## 2011-02-14 ENCOUNTER — Ambulatory Visit (HOSPITAL_COMMUNITY): Admission: RE | Admit: 2011-02-14 | Payer: Commercial Managed Care - PPO | Source: Ambulatory Visit

## 2011-02-14 ENCOUNTER — Other Ambulatory Visit: Payer: Self-pay | Admitting: Family Medicine

## 2011-02-14 DIAGNOSIS — R51 Headache: Secondary | ICD-10-CM | POA: Insufficient documentation

## 2011-02-14 DIAGNOSIS — C50919 Malignant neoplasm of unspecified site of unspecified female breast: Secondary | ICD-10-CM | POA: Insufficient documentation

## 2011-02-14 MED ORDER — GADOBENATE DIMEGLUMINE 529 MG/ML IV SOLN
10.0000 mL | Freq: Once | INTRAVENOUS | Status: AC
  Administered 2011-02-14: 10 mL via INTRAVENOUS

## 2011-02-15 ENCOUNTER — Encounter: Payer: Self-pay | Admitting: *Deleted

## 2011-02-21 NOTE — Op Note (Signed)
NAME:  [name], [name] NO.:  [id number]   MEDICAL RECORD NO.:  [id number]          PATIENT TYPE:  AMB   LOCATION:  DAY                          FACILITY:  [facility]   PHYSICIAN:  Crist Fat. Rivard, M.D. DATE OF BIRTH:  [birth year]   DATE OF PROCEDURE:  02/22/2007  DATE OF DISCHARGE:                               OPERATIVE REPORT   PREOPERATIVE DIAGNOSIS:  Dysfunctional uterine bleeding with recurrent  breast cancer.   POSTOPERATIVE DIAGNOSES:  1. Dysfunctional uterine bleeding with recurrent breast cancer.  2. Pelvic adhesions and uterine fibroids.   ANESTHESIA:  General, Dr. Leta Jungling.   PROCEDURE:  Robot-assisted hysterectomy with bilateral salpingo-  oophorectomy and lysis of adhesions.   SURGEON:  Crist Fat. Rivard, M.D.   ASSISTANTMarquis Lunch. Zenia Resides BLOOD LOSS:  Minimal.   PROCEDURE:  After being informed of the planned procedure with possible  complications including bleeding, infection, injury to bowel, bladder or  ureters, need for laparotomy, as well as the procedure length, hospital  stay, and expected recovery, informed consent is obtained.  The patient  is taken to OR #10 and given general anesthesia with endotracheal  intubation without complication.  She is placed in the lithotomy  position, prepped and draped in a sterile fashion. Sequential  compression devices,knee-high, are in place. Bothh arms are padded and  tucked. Her chest is secured to the table. She is placed on a sticky  pad.   Her pelvic exam reveals a retroverted uterus with a posterior fibroid,  mobile, about 8 weeks size.  Both adnexa are felt and normal.  A  weighted speculum is inserted.  The anterior lip of the cervix is  grasped with a tenaculum forceps, and the uterus is sounded at 9.5 cm.  The cervix is easily dilated with Hegar dilator until No. 25 which  allows easy entry of a 8-cm RUMI intrauterine manipulator.  This is  placed as well as with a 3-cm  Koh-ring and a vaginal occluder.  The RUMI  balloon is inflated with 5 mL of saline.  The Koh-ring is then sutured  to the cervix with 0 Vicryl and a tenaculum is still kept in place on  the cervix for retraction during surgery.  A Foley catheter is then  inserted in a sterile fashion.   We proceed with a supraumbilical 10-mm incision about 5 cm above the  umbilicus after infiltrating with 5 mL of Marcaine 0.25, and we bring  this incision down to the fascia.  The fascia is identified, grasped  with Kocher forceps, incised, and peritoneum is entered bluntly.  A  pursestring suture of 0 Vicryl is placed on the fascia, and a 10-mm  Hassan trocar is easily inserted in the abdominal cavity.  This trocar  is held in place with the previously placed pursestring suture.  We are  now able to insufflate a pneumoperitoneum with CO2 at a maximum pressure  of 15 mmHg.  Camera is inserted to evaluate the abdominal pelvic cavity.  We see adhesions covering the posterior aspect of the uterus.  Overall  appears to be of normal size.  Both adnexa are in a pelvic adhesion  process and unable to visualize at this time.  The appendix is  visualized and normal.  No other disease is identified.  We do see a  small adhesion between the cecum and the right pelvic wall which will  need to be sectioned for insertion of our patient assistant trocar.  Placement of trocars is then decided.  All sites are infiltrated with 3  mL of Marcaine 0.25, and we place under direct visualization two 8-mm  robotic trocars on the left and one 8-mm robotic trocar on the right as  well as the patient side 10-mm trocar after we are able to sharply free  the adhesions previously mentioned.  We can now dock the robot and  proceed with her hysterectomy.   Using a tenaculum forceps in arm #3, a PK Gyrus forceps in arm #2, and a  monopolar scissors in arm #1, we proceed with systematic dissection.  We  start with the right aspect of the  pelvis, and we are able to identify  the ureter easily and isolate the infundibulopelvic ligament on the  right and cauterize it with PK Gyrus energy.  This ligament is then  sectioned which frees the adnexa completely.  With traction on the  adnexa, we are now able to free posterior cul-de-sac and right pelvic  wall grade 1 and 2 adhesions.  We can now cauterize the round ligament  on the right with PK energy and section it which gives [place] easy entry to  the anterior and posterior sheath of the broad ligament.  The anterior  sheath is then incised with scissors which allows [place] to bluntly retract  bladder by developing a bladder flap.  At this time of the surgery,  adhesions with the lower uterine segment are noted due to previous  cesarean section, and we proceed systematically with blunt and sharp  dissection until we are able to see the vaginal fornix with pressure on  the Koh-ring being applied from down below.  The posterior sheath of the  broad ligament is then sharply dissected down, and we then proceed with  skeletonization of the uterine artery on that side.  In the dissection  process, the ureter is completely dissected with the retroperitoneum,  and we have visualization of the ureter on its whole length.  We then go  towards the left, and again we have to sharply dissect pelvic adhesions  so we can free the adnexa from the pelvic wall and the posterior cul-de-  sac as well.  The ureter is identified easily, and with it under direct  visualization, we can grasp and cauterize the infundibulopelvic ligament  on the left and section this ligament to free the adnexa entirely.  The  round ligament is then cauterized and sectioned, and the anterior sheath  of the broad ligament is incised.  The posterior sheath of the broad  ligament is then sharply dissected down, and again the retroperitoneal space is partially dissected.  We proceed with systematic dissection of  the bladder away  from the vaginal fornix as well as systematic  skeletonization of the uterine artery bundle.   We can now using PK energy cauterize the uterine artery in its ascending  branch at the vaginal corner on both sides and section these pedicles.  With pressure applied on the Koh-ring , we open the anterior vaginal  fornix with monopolar energy and continue in a circumferential  manner  until the whole uterus is removed entirely with its two adnexa.  At this  time we have a much better view of the posterior fibroid which appears  to be about 5 cm in the lower uterine segment.  In order to proceed with  the posterior cul-de-sac opening, this fibroid has to be pulled up  slightly.  Moving of the uterus for the final part of the procedure is  done using the tenaculum forceps.  The uterus is then delivered entirely  through the vagina with its two adnexa.  The vaginal occluder is  inflated and reinserted in the vagina.  Hemostasis is adequate.  Instruments are changed for two needle holders, and we proceed with  closure of the vaginal vault with figure-of-eight stitches of 0 Vicryl.  Hemostasis is adequate at the vaginal vault.  We then proceed with  profuse irrigation with warm saline of the pelvis, and we complete  hemostasis on the dissection area of the bladder using PK energy.  Both  ureters are well visualized and normal with normal peristaltism and away  from our sites of cauterization.  Hemostasis is deemed adequate.  A  sheet of Interceed is cut in three, and two parts are used to cover the  vaginal vault and the right pelvic wall dissection.  Instruments are  then removed.  The robot is undocked.  We then remove all trocars under  direct visualization after evacuation of the pneumoperitoneum.   The fascia of the supraumbilical incision is closed with the previously  placed pursestring suture of 0 Vicryl.  The patient right side 10-mm  trocar fascia is then identified, grasped with Kocher  forceps, closed  with a figure-of-eight stitch of 0 Vicryl.  All skin incisions are  closed with subcuticular suture of 3-0 Monocryl and Steri-Strips.   The vaginal occluder is removed.   Instrument and sponge count is complete x2.  Estimated blood loss is  minimal.  Procedure is very well tolerated by the patient who is taken  to recovery room in a well and stable condition.      Crist Fat Rivard, M.D.  Electronically Signed     SAR/MEDQ  D:  02/22/2007  T:  02/22/2007  Job:  [id number]

## 2011-02-21 NOTE — H&P (Signed)
NAME:  [name], [name] NO.:  [id number]   MEDICAL RECORD NO.:  [id number]          PATIENT TYPE:  EMS   LOCATION:  URG                          FACILITY:  [facility]   PHYSICIAN:  Crist Fat. Rivard, M.D. DATE OF BIRTH:  1966/06/18   DATE OF ADMISSION:  10/17/2006  DATE OF DISCHARGE:  10/17/2006                              HISTORY & PHYSICAL   HISTORY OF PRESENT ILLNESS:  [name] is a 45 year old African-  American female, para 1-0-1-1, who presents for a robot-assisted  hysterectomy with bilateral salpingo-oophorectomy, because of  dysfunctional uterine bleeding, and a history of recurrent breast  cancer.  For the past 4 years, since completing chemotherapy, patient  gives a history of intermittent menstrual flow, gives a history of an  intermittent menstrual flow lasting from 1 day to 3 months.  She may  require hourly pad changes with her bleeding (which soils her clothing),  or perhaps only changes one pad daily.  The timing of this bleeding is  also very unpredictable and often accompanied by clots.  She has been  prescribed Depo-Provera 150 mg every 12 weeks for 2 years; however, she  has not had any relief of her symptoms.  She denies any dyspareunia,  changes in bowel habits, urinary tract symptoms, vaginitis symptoms or  fever.  A pelvic ultrasound in March of 2008 showed a uterus measuring 8  cm x 6.8 cm x 5.8 cm, with a posterior fibroid measuring 3.87 x 3.18 x  3.70 cm.  There was a simple left ovarian cyst measuring 2.3 cm, and two  hyperechoic masses in the endometrium, measuring 0.59 cm and 0.60 cm  respectively.  The patient's right ovary appeared normal on the study.  An endometrial biopsy done at the same time returned benign endometrium  with exogenous hormone effect.  There were no hyperplasia identified.  Given the disruptive nature of her bleeding, limited treatment options  and history of recurrent breast cancer, patient has decided to  proceed  with definitive therapy of her symptoms in the form of hysterectomy with  bilateral salpingo-oophorectomy.   PAST MEDICAL HISTORY:  OB history:  Gravida 2, para 1-0-1-1.  Patient  had a cesarean section in 1986.  GYN history:  Menarche 45 years old.  Last menstrual period Feb 11, 2007.  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  Her last normal Pap smear was February 2008.   MEDICAL HISTORY:  Breast cancer on two occasions (1999 and 2004),  anemia.   SURGICAL HISTORY:  1999, right breast mastectomy; 2005 left breast  mastectomy.  Denies any problems with anesthesia or history of blood  transfusions.   FAMILY HISTORY:  Hypertension, diabetes mellitus.   SOCIAL HISTORY:  Patient works as [employer] as a [employer].  She is currently separated.   HABITS:  She smokes 1/4 pack of cigarettes per day.  She did not consume  alcohol nor elicit drugs.  Currently not taking any medication.   ALLERGIES:  SHE IS ALLERGIC TO PENICILLIN  AND CODEINE, BOTH OF WHICH  CAUSES HER TO ITCH.   REVIEW OF SYSTEMS:  The patient wears glasses.  She does have occasional  vaginal dryness but denies any chest pain, shortness of breath,  headache, vision changes or pelvic pain, and except as is mentioned in  history of present illness, patient's review of systems is negative.   PHYSICAL EXAMINATION:  VITAL SIGNS:  Blood pressure 110/80, weight is  123, height is 5 feet 6 inches tall.  GENERAL:  Neck is supple without masses.  There is no thyromegaly or  cervical adenopathy.  HEART:  Regular rate and rhythm.  There is no murmur.  LUNGS:  Clear.  There are no wheezes, rales or rhonchi.  BACK:  No CVA tenderness.  ABDOMEN:  Without tenderness, guarding, rebound or organomegaly.  There  are no palpable masses.  EXTREMITIES:  No clubbing, cyanosis or edema.  PELVIC:  EGBUS  is within normal limits.  Vagina is rugose.  Cervix is  nontender without lesions.  Uterus is retroverted, appears normal size,  shape and consistency without tenderness.  Adnexa:  No tenderness or  masses.  Rectovaginal confirms.   IMPRESSION:  1. Dysfunctional uterine bleeding.  2. Status post recurrent bilateral breast cancer.   DISPOSITION:  A discussion was held with patient regarding the  indication for her procedure, along with it's risks which include but  are not limited to reaction to anesthesia, damage to adjacent organs,  infection, bleeding and the possibility that an abdominal incision may  be required to complete her surgery.  Additionally, patient was further  advised, and she verbalized understanding regarding the length of her  robotic hysterectomy procedure.  Her expected recovery could take 2 to 3  weeks, and that she will experience transient facial edema  postoperatively.  Patient was given a MiraLax bowel prep to be used 24  hours prior to her scheduled surgery.  Patient has consented to proceed  with a robot-assisted hysterectomy with bilateral salpingo-oophorectomy  on Feb 22, 2007 at 7:30 a.m. at [facility].      [name].      Crist Fat Rivard, M.D.  Electronically Signed    EJP/MEDQ  D:  02/19/2007  T:  02/19/2007  Job:  [id number]

## 2011-02-24 NOTE — Op Note (Signed)
NAME:  [name], [name]                           ACCOUNT NO.:  [id number]   MEDICAL RECORD NO.:  [id number]                   PATIENT TYPE:  AMB   LOCATION:  DSC                                  FACILITY:  [facility]   PHYSICIAN:  Adolph Pollack, M.D.            DATE OF BIRTH:  1966-04-25   DATE OF PROCEDURE:  05/19/2003  DATE OF DISCHARGE:                                 OPERATIVE REPORT   PREOPERATIVE DIAGNOSIS:  Left breast masses.   POSTOPERATIVE DIAGNOSIS:  Left breast masses.   OPERATION PERFORMED:  Excision of left breast masses.   SURGEON:  Adolph Pollack, M.D.   ANESTHESIA:  General.   INDICATIONS FOR PROCEDURE:  [name] is a 45 year old female with a history  of invasive right breast cancer.  She noted some painful areas in the upper  outer quadrant of the left breast at the junction of the axillary area.  These were solid masses by ultrasound.  She now presents for excision of  these.  The procedure and the risks including but not limited to bleeding,  infection, accidental nerve injury were explained to her preoperatively.   DESCRIPTION OF PROCEDURE:  She was seen in the holding area and the left  chest was marked.  She was then brought to the operating room and placed  supine on the operating table and the masses were marked with a marking pen.  General anesthetic was administered. The left breast and axillary area were  sterilely prepped and draped.  In the upper outer quadrant, a curvilinear  incision was made directly over the masses after instilling dilute Marcaine  in the subcutaneous and subdermal and dermal tissues.  I carried this down  to the subcutaneous fat.  Subsequently, I raised some skin flaps in all  directions.  I was able to palpate the masses and they were adherent to the  chest wall.  I then excised these masses sharply and also excised some  muscle with it.  There was also normal breast tissue around the area.  I  sent this specimen fresh  to pathology.   Next, I inspected the wound.  There was a little bit of bleeding from muscle  edges that I controlled with the cautery.  I then injected more local  anesthetic into the wound bed and irrigated the wound also with it.  Once  hemostasis was adequate, I closed the wound by loosely approximating the  subcutaneous tissue with interrupted 2-0 Vicryl sutures and closed the skin  with 4-0 Monocryl subcuticular stitch.  Steri-Strips and sterile dressings  were applied.   The patient tolerated the procedure well without any apparent complications  and was taken to the recovery room in satisfactory condition.  She will be  given postoperative instructions and a prescription for Vicodin for pain and  follow up with me in about a week in the office.  Adolph Pollack, M.D.    [name]  D:  05/19/2003  T:  05/19/2003  Job:  [id number]   cc:   Genene Churn. Cyndie Chime, M.D.  [address] [facility]  [place]  [place] [address]  Fax: [phone]   Dr. Cheral Marker

## 2011-02-24 NOTE — Op Note (Signed)
NAME:  [name], [name]                           ACCOUNT NO.:  [id number]   MEDICAL RECORD NO.:  [id number]                   PATIENT TYPE:  AMB   LOCATION:  DSC                                  FACILITY:  [facility]   PHYSICIAN:  Adolph Pollack, M.D.            DATE OF BIRTH:  [birth year]   DATE OF PROCEDURE:  06/04/2003  DATE OF DISCHARGE:                                 OPERATIVE REPORT   PREOPERATIVE DIAGNOSIS:  Left breast cancer.   POSTOPERATIVE DIAGNOSIS:  Left breast cancer.   OPERATION PERFORMED:  1. Left modified radical mastectomy.  2. Insertion of single lumen Port-A-Cath to right internal jugular vein with     C-arm guidance.   SURGEON:  Adolph Pollack, M.D.   ANESTHESIA:  General.   INDICATIONS FOR PROCEDURE:  [name] is a 45 year old female with a  previous history of right breast cancer.  She had a right modified radical  mastectomy in the past.  She had an upper outer quadrant small lesion noted  and biopsy demonstrated invasive cancer.  It is very close to the axilla.  She now presents for the above procedures.   DESCRIPTION OF PROCEDURE:  She had an IV placed in her foot and was brought  to the operating room, placed supine on the operating table and a general  anesthetic was administered.  The left chest and axillary area were  sterilely prepped and draped.  An elliptical incision was made in an oblique  direction to include the old biopsy site incising the skin and dermis.  Subsequently, I used breast hooks and raised skin flaps superiorly to the  clavicle, medially to the sternum, inferiorly to the anterior rectus sheath  and laterally to the latissimus muscle. I then used the cautery to dissect  the breast free from the underlying pectoralis muscle and fascia.  When I  came to the point where the previous biopsy site was, I did excise muscle as  the margins had been reported to be positive.  I then retracted the breast  laterally and using sharp  dissection incised the clavipectoral fascia and  entered the axillary area.  The axillary vein was identified.  Using blunt  dissection, I swept the axillary fatty tissue and lymph nodes inferiorly.  I  clipped small vessels and divided them.  I then identified the long thoracic  nerve and the thoracodorsal nerves and tested both these.  They were  functional.  Using some blunt dissection and clipping small vessels, I  removed all of the lymphatics and fatty tissue in between these two nerves.  I did have to divide two intercostal brachial cutaneous nerves.  Once this  was done and the nerves were out of the plain of dissection, I then used the  cautery to excise the rest of the axillary contents and the breast and  handed the specimen off the field.  I once  tested the nerves and they were  both functional.   Next, I irrigated the wound with saline solution.  Bleeding from the chest  wall was controlled with cautery.  The axillary area was hemostatic.  I then  placed two stab incisions in the inferior flap and placed one 19 Blake drain  into to the axilla and the other one inferior to the superior flap against  the pectoralis muscle.  These were then sewn to the skin with 4-0 nylon  suture.  The skin flaps were then closed by loosely approximating the  subcutaneous tissues with interrupted 3-0 Vicryl sutures and closing the  skin with staples.  Adaptic was placed over the wound followed by a sterile  dressing and the drains were hooked up to closed suction.   Next, the drapes were removed.  Gowns and gloves were changed and we  rescrubbed.  The right chest wall and neck were sterilely prepped and draped  in preparation for the Port-A-Cath.  I cannulated first the carotid artery  and held pressure on this for 10 minutes in the right neck.  I then was able  to cannulate the right jugular vein.  A wire was passed into the right heart  under fluoroscopic guidance.  Following this, I made an  incision in the  right upper chest wall and created a pocket for the Port-A-Cath.  A tunnel  was made bluntly from the superior to the inferior incision.  Then the  catheter was passed through the tunnel.  A dilator introducer complex was  then placed into the superior vena cava under fluoroscopic guidance.  Dilator and wire were removed and the catheter was passed through the peel-  away sheath into the right heart.  The peel-away sheath was removed.  The  catheter was then pulled back until the tip was in the midsuperior vena  cava.  The catheter was cut and attached to the port.  The port was then  anchored to the chest wall with interrupted 2-0 Vicryl sutures.  The port  withdrew blood easily and flushed easily.  Final fluoroscopic view  demonstrated the catheter tip in the midsuperior vena cava.   Next, I closed the subcutaneous tissue over the port with the running 3-0  Vicryl suture.  The skin incisions were closed with 4-0 Monocryl  subcuticular.  I placed a Huber needle within the port, withdrew blood and  flushed it and then sterilely hooked up the [place] needle to an IV.  Sterile  dressing was applied.  She tolerated both procedures fairly well without any  apparent complications.  She was taken to the recovery room where a portable  chest x-ray is pending.  The plan will be to keep her for overnight  observation.                                                Adolph Pollack, M.D.    [name]  D:  06/04/2003  T:  06/05/2003  Job:  [id number]   cc:   [name]. [name], M.D.  [address] [facility]  [place]  [place] [address]  Fax: [phone]

## 2011-02-24 NOTE — Op Note (Signed)
NAME:  [name], [name]                   ACCOUNT NO.:  [id number]   MEDICAL RECORD NO.:  [id number]                   PATIENT TYPE:  AMB   LOCATION:  DSC                                  FACILITY:  [facility]   PHYSICIAN:  Adolph Pollack, M.D.            DATE OF BIRTH:  [birth year]   DATE OF PROCEDURE:  12/15/2003  DATE OF DISCHARGE:                                 OPERATIVE REPORT   PREOPERATIVE DIAGNOSIS:  Retained Port-A-Cath.   POSTOPERATIVE DIAGNOSIS:  Retained Port-A-Cath.   OPERATION PERFORMED:  Port-A-Cath removal.   SURGEON:  Adolph Pollack, M.D.   ANESTHESIA:  General LMA.   INDICATIONS FOR PROCEDURE:  [name] is a 45 year old female who recently  completed chemotherapy for left breast cancer.  She now presents for Port-A-  Cath removal.  The procedure and the risks were discussed with her  preoperatively including bleeding and infection.   DESCRIPTION OF PROCEDURE:  She was seen in the holding area and the area  marked with my initials.  The port was in the right upper chest wall.  She  was then brought to the operating room, placed supine on the operating room  and per her request a general anesthetic was administered.  The right upper  chest wall was sterilely prepped and draped.  Local anesthetic was  infiltrated in the previous incision just superior to the Port-A-Cath and  the previous incision was reincised down through the subdermis until the  catheter was identified.  The catheter was freed up of fibrous tissue and  removed.  Direct pressure was held in the right supraclavicular region for  10 minutes.  While pressure was being held using sharp dissection, the port  was excised.  The catheter was intact and connected to the ports.  Hemostasis was obtained with the electrocautery.  0.5% Marcaine was  infiltrated in the Port-A-Cath bed for local anesthetic effect.  Following  this, the skin was closed with a running 4-0 Monocryl subcuticular stitch.  Steri-Strips and sterile dressing were applied.  The patient tolerated the  procedure well without any apparent complications and was taken to the  recovery room in satisfactory condition.  Postoperative instructions will be  given to her.                                               Adolph Pollack, M.D.    Kari Baars  D:  12/15/2003  T:  12/15/2003  Job:  [id number]

## 2012-01-24 IMAGING — CR DG CERVICAL SPINE 2 OR 3 VIEWS
2 series · 2 of 2 positions shown · non-contrast
Comparison: None 12/14/2003 chest x-ray

CLINICAL DATA: Pain in neck, going into the arm.  Pain and
discomfort is on the right side.  No known injury.

CERVICAL SPINE - 2-3 VIEW

[view not recorded (1 of 2)]
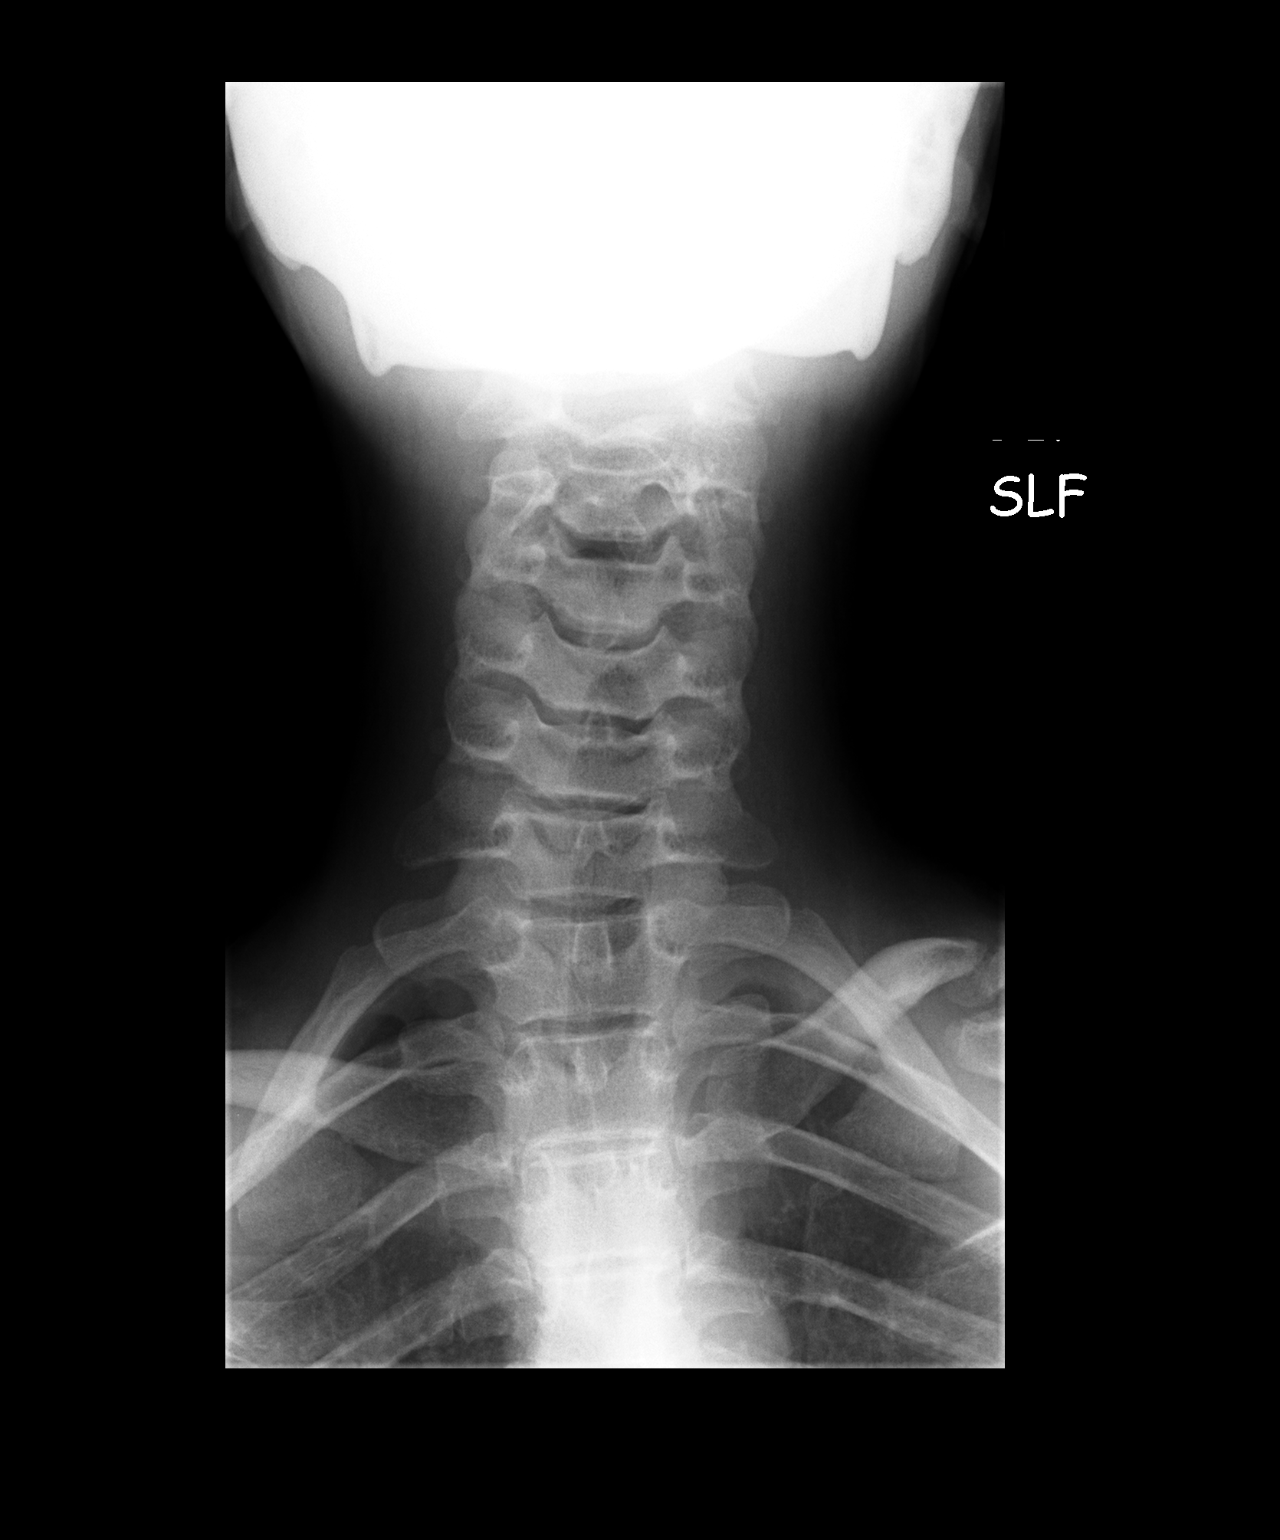

[view not recorded (2 of 2)]
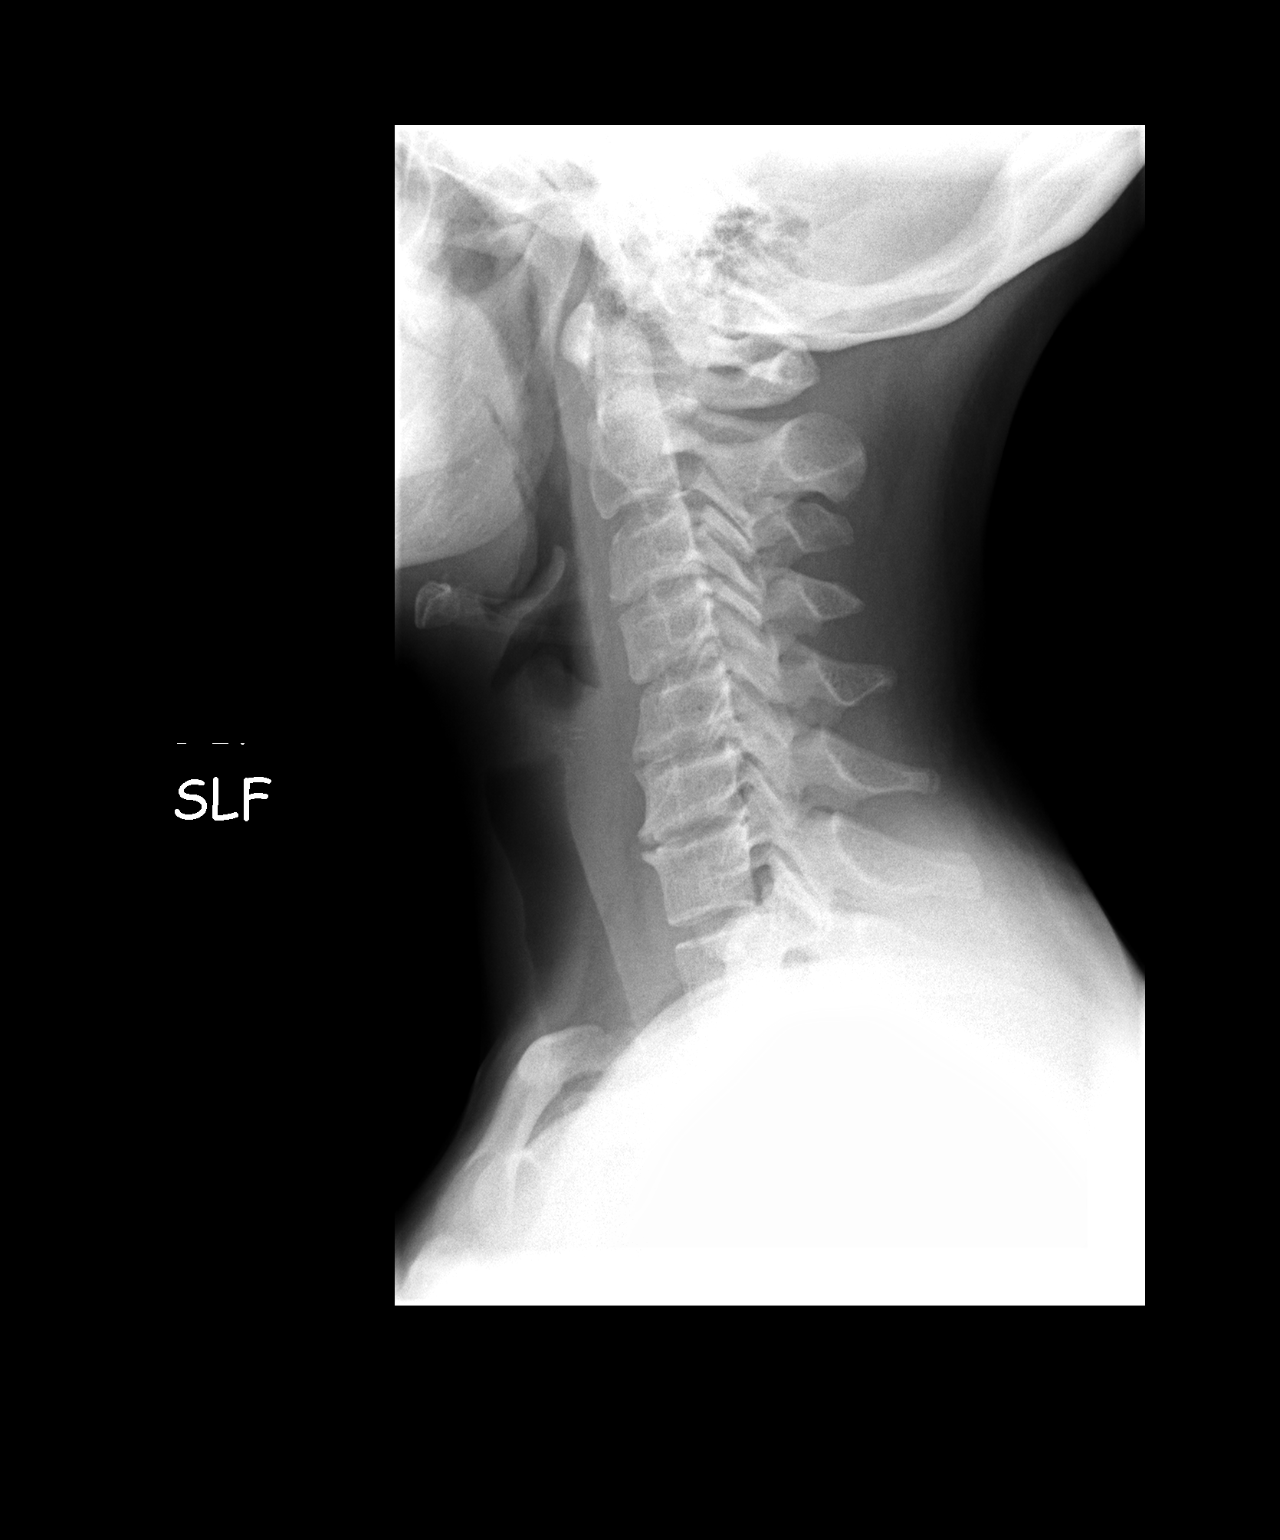

[2 of 2 positions shown; findings below may reference images not displayed]

FINDINGS: There is normal alignment of the cervical spine. There is
loss of cervical lordosis.  This may be secondary to splinting,
soft tissue injury, or positioning.  .  No evidence for acute
fracture or dislocation.  Degenerative changes are seen at C6-7.
Prevertebral soft tissues have a normal appearance.

Note is made of old left clavicle fracture.  Lung apices are
unremarkable.
IMPRESSION: 1.  Loss of cervical lordosis.
2.  Degenerative changes.
3.  Old left clavicle fracture.

## 2012-05-29 ENCOUNTER — Encounter: Payer: Commercial Managed Care - PPO | Admitting: Family Medicine

## 2012-07-03 ENCOUNTER — Encounter: Payer: Self-pay | Admitting: Family Medicine

## 2012-07-03 ENCOUNTER — Ambulatory Visit (INDEPENDENT_AMBULATORY_CARE_PROVIDER_SITE_OTHER): Payer: Commercial Managed Care - PPO | Admitting: Family Medicine

## 2012-07-03 VITALS — BP 114/72 | HR 85 | Temp 98.3°F | Ht 67.0 in | Wt 129.6 lb

## 2012-07-03 DIAGNOSIS — Z23 Encounter for immunization: Secondary | ICD-10-CM

## 2012-07-03 DIAGNOSIS — N390 Urinary tract infection, site not specified: Secondary | ICD-10-CM

## 2012-07-03 DIAGNOSIS — Z780 Asymptomatic menopausal state: Secondary | ICD-10-CM

## 2012-07-03 DIAGNOSIS — Z0000 Encounter for general adult medical examination without abnormal findings: Secondary | ICD-10-CM

## 2012-07-03 LAB — CBC WITH DIFFERENTIAL/PLATELET
Basophils Relative: 0.3 % (ref 0.0–3.0)
Eosinophils Absolute: 0.4 10*3/uL (ref 0.0–0.7)
MCHC: 32.9 g/dL (ref 30.0–36.0)
MCV: 91.9 fl (ref 78.0–100.0)
Monocytes Absolute: 0.4 10*3/uL (ref 0.1–1.0)
Neutrophils Relative %: 52.9 % (ref 43.0–77.0)
Platelets: 243 10*3/uL (ref 150.0–400.0)
RBC: 4.66 Mil/uL (ref 3.87–5.11)
RDW: 13.4 % (ref 11.5–14.6)

## 2012-07-03 LAB — POCT URINALYSIS DIPSTICK
Ketones, UA: NEGATIVE
Protein, UA: NEGATIVE
Spec Grav, UA: 1.02
pH, UA: 6.5

## 2012-07-03 LAB — LIPID PANEL
HDL: 35.3 mg/dL — ABNORMAL LOW (ref >39.00)
Triglycerides: 123 mg/dL (ref 0.0–149.0)

## 2012-07-03 LAB — BASIC METABOLIC PANEL
BUN: 12 mg/dL (ref 6–23)
CO2: 28 mEq/L (ref 19–32)
Chloride: 103 mEq/L (ref 96–112)
Creatinine, Ser: 1 mg/dL (ref 0.4–1.2)
Glucose, Bld: 71 mg/dL (ref 70–99)

## 2012-07-03 LAB — HEPATIC FUNCTION PANEL
Bilirubin, Direct: 0 mg/dL (ref 0.0–0.3)
Total Protein: 8 g/dL (ref 6.0–8.3)

## 2012-07-03 NOTE — Progress Notes (Signed)
Subjective:     [name] is a 46 y.o. female and is here for a comprehensive physical exam. The patient reports no problems.  History   Social History  . Marital Status: Single    Spouse Name: N/A    Number of Children: N/A  . Years of Education: N/A   Occupational History  . Not on file.   Social History Main Topics  . Smoking status: Current Every Day Smoker -- 0.5 packs/day for 27 years    Types: Cigarettes  . Smokeless tobacco: Never Used  . Alcohol Use: Yes  . Drug Use: No  . Sexually Active: Not Currently   Other Topics Concern  . Not on file   Social History Narrative  . No narrative on file   Health Maintenance  Topic Date Due  . Pap Smear  06/25/1984  . Influenza Vaccine  06/09/2013  . Tetanus/tdap  10/10/2019    The following portions of the patient's history were reviewed and updated as appropriate:  She  has a past medical history of Breast cancer (1999). She  does not have any pertinent problems on file. She  has past surgical history that includes Abdominal hysterectomy (2006) and Mastectomy. Her family history includes Aortic aneurysm in an unspecified family member; Hypertension in her sister and unspecified family member; and Stroke in her sister. She  reports that she has been smoking Cigarettes.  She has a 13.5 pack-year smoking history. She has never used smokeless tobacco. She reports that she drinks alcohol. She reports that she does not use illicit drugs. She currently has no medications in their medication list. No current outpatient prescriptions on file prior to visit.   She is allergic to penicillins..  Review of Systems Review of Systems  Constitutional: Negative for activity change, appetite change and fatigue.  HENT: Negative for hearing loss, congestion, tinnitus and ear discharge.  dentist q31m Eyes: Negative for visual disturbance (see optho q1y -- vision corrected to 20/20 with glasses).  Respiratory: Negative for cough, chest  tightness and shortness of breath.   Cardiovascular: Negative for chest pain, palpitations and leg swelling.  Gastrointestinal: Negative for abdominal pain, diarrhea, constipation and abdominal distention.  Genitourinary: Negative for urgency, frequency, decreased urine volume and difficulty urinating.  Musculoskeletal: Negative for back pain, arthralgias and gait problem.  Skin: Negative for color change, pallor and rash.  Neurological: Negative for dizziness, light-headedness, numbness and headaches.  Hematological: Negative for adenopathy. Does not bruise/bleed easily.  Psychiatric/Behavioral: Negative for suicidal ideas, confusion, sleep disturbance, self-injury, dysphoric mood, decreased concentration and agitation.       Objective:   BP 114/72  Pulse 85  Temp 98.3 F (36.8 C) (Oral)  Ht 5\' 7"  (1.702 m)  Wt 129 lb 9.6 oz (58.786 kg)  BMI 20.30 kg/m2  SpO2 99%  General Appearance:    Alert, cooperative, no distress, appears stated age  Head:    Normocephalic, without obvious abnormality, atraumatic  Eyes:    PERRL, conjunctiva/corneas clear, EOM's intact, fundi    benign, both eyes  Ears:    Normal TM's and external ear canals, both ears  Nose:   Nares normal, septum midline, mucosa normal, no drainage    or sinus tenderness  Throat:   Lips, mucosa, and tongue normal; teeth and gums normal  Neck:   Supple, symmetrical, trachea midline, no adenopathy;    thyroid:  no enlargement/tenderness/nodules; no carotid   bruit or JVD  Back:     Symmetric, no curvature,  ROM normal, no CVA tenderness  Lungs:     Clear to auscultation bilaterally, respirations unlabored  Chest Wall:    No tenderness or deformity   Heart:    Regular rate and rhythm, S1 and S2 normal, no murmur, rub   or gallop  Breast Exam:   b/l mastectomy  Abdomen:     Soft, non-tender, bowel sounds active all four quadrants,    no masses, no organomegaly  Genitalia:    S/p TAH  Rectal:  deferred  Extremities:    Extremities normal, atraumatic, no cyanosis or edema  Pulses:   2+ and symmetric all extremities  Skin:   Skin color, texture, turgor normal, no rashes or lesions  Lymph nodes:   Cervical, supraclavicular, and axillary nodes normal  Neurologic:   CNII-XII intact, normal strength, sensation and reflexes    throughout   Assessment:    Healthy female exam.      Plan:  ghm -- check colon, bmd Check fasting labs   See After Visit Summary for Counseling Recommendations

## 2012-07-03 NOTE — Patient Instructions (Addendum)
Preventive Care for Adults, Female A healthy lifestyle and preventive care can promote health and wellness. Preventive health guidelines for women include the following key practices.  A routine yearly physical is a good way to check with your caregiver about your health and preventive screening. It is a chance to share any concerns and updates on your health, and to receive a thorough exam.   Visit your dentist for a routine exam and preventive care every 6 months. Brush your teeth twice a day and floss once a day. Good oral hygiene prevents tooth decay and gum disease.   The frequency of eye exams is based on your age, health, family medical history, use of contact lenses, and other factors. Follow your caregiver's recommendations for frequency of eye exams.   Eat a healthy diet. Foods like vegetables, fruits, whole grains, low-fat dairy products, and lean protein foods contain the nutrients you need without too many calories. Decrease your intake of foods high in solid fats, added sugars, and salt. Eat the right amount of calories for you.Get information about a proper diet from your caregiver, if necessary.   Regular physical exercise is one of the most important things you can do for your health. Most adults should get at least 150 minutes of moderate-intensity exercise (any activity that increases your heart rate and causes you to sweat) each week. In addition, most adults need muscle-strengthening exercises on 2 or more days a week.   Maintain a healthy weight. The body mass index (BMI) is a screening tool to identify possible weight problems. It provides an estimate of body fat based on height and weight. Your caregiver can help determine your BMI, and can help you achieve or maintain a healthy weight.For adults 20 years and older:   A BMI below 18.5 is considered underweight.   A BMI of 18.5 to 24.9 is normal.   A BMI of 25 to 29.9 is considered overweight.   A BMI of 30 and above is  considered obese.   Maintain normal blood lipids and cholesterol levels by exercising and minimizing your intake of saturated fat. Eat a balanced diet with plenty of fruit and vegetables. Blood tests for lipids and cholesterol should begin at age 20 and be repeated every 5 years. If your lipid or cholesterol levels are high, you are over 50, or you are at high risk for heart disease, you may need your cholesterol levels checked more frequently.Ongoing high lipid and cholesterol levels should be treated with medicines if diet and exercise are not effective.   If you smoke, find out from your caregiver how to quit. If you do not use tobacco, do not start.   If you are pregnant, do not drink alcohol. If you are breastfeeding, be very cautious about drinking alcohol. If you are not pregnant and choose to drink alcohol, do not exceed 1 drink per day. One drink is considered to be 12 ounces (355 mL) of beer, 5 ounces (148 mL) of wine, or 1.5 ounces (44 mL) of liquor.   Avoid use of street drugs. Do not share needles with anyone. Ask for help if you need support or instructions about stopping the use of drugs.   High blood pressure causes heart disease and increases the risk of stroke. Your blood pressure should be checked at least every 1 to 2 years. Ongoing high blood pressure should be treated with medicines if weight loss and exercise are not effective.   If you are 55 to 46   years old, ask your caregiver if you should take aspirin to prevent strokes.   Diabetes screening involves taking a blood sample to check your fasting blood sugar level. This should be done once every 3 years, after age 45, if you are within normal weight and without risk factors for diabetes. Testing should be considered at a younger age or be carried out more frequently if you are overweight and have at least 1 risk factor for diabetes.   Breast cancer screening is essential preventive care for women. You should practice "breast  self-awareness." This means understanding the normal appearance and feel of your breasts and may include breast self-examination. Any changes detected, no matter how small, should be reported to a caregiver. Women in their 20s and 30s should have a clinical breast exam (CBE) by a caregiver as part of a regular health exam every 1 to 3 years. After age 40, women should have a CBE every year. Starting at age 40, women should consider having a mammography (breast X-ray test) every year. Women who have a family history of breast cancer should talk to their caregiver about genetic screening. Women at a high risk of breast cancer should talk to their caregivers about having magnetic resonance imaging (MRI) and a mammography every year.   The Pap test is a screening test for cervical cancer. A Pap test can show cell changes on the cervix that might become cervical cancer if left untreated. A Pap test is a procedure in which cells are obtained and examined from the lower end of the uterus (cervix).   Women should have a Pap test starting at age 21.   Between ages 21 and 29, Pap tests should be repeated every 2 years.   Beginning at age 30, you should have a Pap test every 3 years as long as the past 3 Pap tests have been normal.   Some women have medical problems that increase the chance of getting cervical cancer. Talk to your caregiver about these problems. It is especially important to talk to your caregiver if a new problem develops soon after your last Pap test. In these cases, your caregiver may recommend more frequent screening and Pap tests.   The above recommendations are the same for women who have or have not gotten the vaccine for human papillomavirus (HPV).   If you had a hysterectomy for a problem that was not cancer or a condition that could lead to cancer, then you no longer need Pap tests. Even if you no longer need a Pap test, a regular exam is a good idea to make sure no other problems are  starting.   If you are between ages 65 and 70, and you have had normal Pap tests going back 10 years, you no longer need Pap tests. Even if you no longer need a Pap test, a regular exam is a good idea to make sure no other problems are starting.   If you have had past treatment for cervical cancer or a condition that could lead to cancer, you need Pap tests and screening for cancer for at least 20 years after your treatment.   If Pap tests have been discontinued, risk factors (such as a new sexual partner) need to be reassessed to determine if screening should be resumed.   The HPV test is an additional test that may be used for cervical cancer screening. The HPV test looks for the virus that can cause the cell changes on the cervix.   The cells collected during the Pap test can be tested for HPV. The HPV test could be used to screen women aged 30 years and older, and should be used in women of any age who have unclear Pap test results. After the age of 30, women should have HPV testing at the same frequency as a Pap test.   Colorectal cancer can be detected and often prevented. Most routine colorectal cancer screening begins at the age of 50 and continues through age 75. However, your caregiver may recommend screening at an earlier age if you have risk factors for colon cancer. On a yearly basis, your caregiver may provide home test kits to check for hidden blood in the stool. Use of a small camera at the end of a tube, to directly examine the colon (sigmoidoscopy or colonoscopy), can detect the earliest forms of colorectal cancer. Talk to your caregiver about this at age 50, when routine screening begins. Direct examination of the colon should be repeated every 5 to 10 years through age 75, unless early forms of pre-cancerous polyps or small growths are found.   Hepatitis C blood testing is recommended for all people born from 1945 through 1965 and any individual with known risks for hepatitis C.    Practice safe sex. Use condoms and avoid high-risk sexual practices to reduce the spread of sexually transmitted infections (STIs). STIs include gonorrhea, chlamydia, syphilis, trichomonas, herpes, HPV, and human immunodeficiency virus (HIV). Herpes, HIV, and HPV are viral illnesses that have no cure. They can result in disability, cancer, and death. Sexually active women aged 25 and younger should be checked for chlamydia. Older women with new or multiple partners should also be tested for chlamydia. Testing for other STIs is recommended if you are sexually active and at increased risk.   Osteoporosis is a disease in which the bones lose minerals and strength with aging. This can result in serious bone fractures. The risk of osteoporosis can be identified using a bone density scan. Women ages 65 and over and women at risk for fractures or osteoporosis should discuss screening with their caregivers. Ask your caregiver whether you should take a calcium supplement or vitamin D to reduce the rate of osteoporosis.   Menopause can be associated with physical symptoms and risks. Hormone replacement therapy is available to decrease symptoms and risks. You should talk to your caregiver about whether hormone replacement therapy is right for you.   Use sunscreen with sun protection factor (SPF) of 30 or more. Apply sunscreen liberally and repeatedly throughout the day. You should seek shade when your shadow is shorter than you. Protect yourself by wearing long sleeves, pants, a wide-brimmed hat, and sunglasses year round, whenever you are outdoors.   Once a month, do a whole body skin exam, using a mirror to look at the skin on your back. Notify your caregiver of new moles, moles that have irregular borders, moles that are larger than a pencil eraser, or moles that have changed in shape or color.   Stay current with required immunizations.   Influenza. You need a dose every fall (or winter). The composition of  the flu vaccine changes each year, so being vaccinated once is not enough.   Pneumococcal polysaccharide. You need 1 to 2 doses if you smoke cigarettes or if you have certain chronic medical conditions. You need 1 dose at age 65 (or older) if you have never been vaccinated.   Tetanus, diphtheria, pertussis (Tdap, Td). Get 1 dose of   Tdap vaccine if you are younger than age 65, are over 65 and have contact with an infant, are a healthcare worker, are pregnant, or simply want to be protected from whooping cough. After that, you need a Td booster dose every 10 years. Consult your caregiver if you have not had at least 3 tetanus and diphtheria-containing shots sometime in your life or have a deep or dirty wound.   HPV. You need this vaccine if you are a woman age 26 or younger. The vaccine is given in 3 doses over 6 months.   Measles, mumps, rubella (MMR). You need at least 1 dose of MMR if you were born in 1957 or later. You may also need a second dose.   Meningococcal. If you are age 19 to 21 and a first-year college student living in a residence hall, or have one of several medical conditions, you need to get vaccinated against meningococcal disease. You may also need additional booster doses.   Zoster (shingles). If you are age 60 or older, you should get this vaccine.   Varicella (chickenpox). If you have never had chickenpox or you were vaccinated but received only 1 dose, talk to your caregiver to find out if you need this vaccine.   Hepatitis A. You need this vaccine if you have a specific risk factor for hepatitis A virus infection or you simply wish to be protected from this disease. The vaccine is usually given as 2 doses, 6 to 18 months apart.   Hepatitis B. You need this vaccine if you have a specific risk factor for hepatitis B virus infection or you simply wish to be protected from this disease. The vaccine is given in 3 doses, usually over 6 months.  Preventive Services /  Frequency Ages 19 to 39  Blood pressure check.** / Every 1 to 2 years.   Lipid and cholesterol check.** / Every 5 years beginning at age 20.   Clinical breast exam.** / Every 3 years for women in their 20s and 30s.   Pap test.** / Every 2 years from ages 21 through 29. Every 3 years starting at age 30 through age 65 or 70 with a history of 3 consecutive normal Pap tests.   HPV screening.** / Every 3 years from ages 30 through ages 65 to 70 with a history of 3 consecutive normal Pap tests.   Hepatitis C blood test.** / For any individual with known risks for hepatitis C.   Skin self-exam. / Monthly.   Influenza immunization.** / Every year.   Pneumococcal polysaccharide immunization.** / 1 to 2 doses if you smoke cigarettes or if you have certain chronic medical conditions.   Tetanus, diphtheria, pertussis (Tdap, Td) immunization. / A one-time dose of Tdap vaccine. After that, you need a Td booster dose every 10 years.   HPV immunization. / 3 doses over 6 months, if you are 26 and younger.   Measles, mumps, rubella (MMR) immunization. / You need at least 1 dose of MMR if you were born in 1957 or later. You may also need a second dose.   Meningococcal immunization. / 1 dose if you are age 19 to 21 and a first-year college student living in a residence hall, or have one of several medical conditions, you need to get vaccinated against meningococcal disease. You may also need additional booster doses.   Varicella immunization.** / Consult your caregiver.   Hepatitis A immunization.** / Consult your caregiver. 2 doses, 6 to 18 months   apart.   Hepatitis B immunization.** / Consult your caregiver. 3 doses usually over 6 months.  Ages 40 to 64  Blood pressure check.** / Every 1 to 2 years.   Lipid and cholesterol check.** / Every 5 years beginning at age 20.   Clinical breast exam.** / Every year after age 40.   Mammogram.** / Every year beginning at age 40 and continuing for as  long as you are in good health. Consult with your caregiver.   Pap test.** / Every 3 years starting at age 30 through age 65 or 70 with a history of 3 consecutive normal Pap tests.   HPV screening.** / Every 3 years from ages 30 through ages 65 to 70 with a history of 3 consecutive normal Pap tests.   Fecal occult blood test (FOBT) of stool. / Every year beginning at age 50 and continuing until age 75. You may not need to do this test if you get a colonoscopy every 10 years.   Flexible sigmoidoscopy or colonoscopy.** / Every 5 years for a flexible sigmoidoscopy or every 10 years for a colonoscopy beginning at age 50 and continuing until age 75.   Hepatitis C blood test.** / For all people born from 1945 through 1965 and any individual with known risks for hepatitis C.   Skin self-exam. / Monthly.   Influenza immunization.** / Every year.   Pneumococcal polysaccharide immunization.** / 1 to 2 doses if you smoke cigarettes or if you have certain chronic medical conditions.   Tetanus, diphtheria, pertussis (Tdap, Td) immunization.** / A one-time dose of Tdap vaccine. After that, you need a Td booster dose every 10 years.   Measles, mumps, rubella (MMR) immunization. / You need at least 1 dose of MMR if you were born in 1957 or later. You may also need a second dose.   Varicella immunization.** / Consult your caregiver.   Meningococcal immunization.** / Consult your caregiver.   Hepatitis A immunization.** / Consult your caregiver. 2 doses, 6 to 18 months apart.   Hepatitis B immunization.** / Consult your caregiver. 3 doses, usually over 6 months.  Ages 65 and over  Blood pressure check.** / Every 1 to 2 years.   Lipid and cholesterol check.** / Every 5 years beginning at age 20.   Clinical breast exam.** / Every year after age 40.   Mammogram.** / Every year beginning at age 40 and continuing for as long as you are in good health. Consult with your caregiver.   Pap test.** /  Every 3 years starting at age 30 through age 65 or 70 with a 3 consecutive normal Pap tests. Testing can be stopped between 65 and 70 with 3 consecutive normal Pap tests and no abnormal Pap or HPV tests in the past 10 years.   HPV screening.** / Every 3 years from ages 30 through ages 65 or 70 with a history of 3 consecutive normal Pap tests. Testing can be stopped between 65 and 70 with 3 consecutive normal Pap tests and no abnormal Pap or HPV tests in the past 10 years.   Fecal occult blood test (FOBT) of stool. / Every year beginning at age 50 and continuing until age 75. You may not need to do this test if you get a colonoscopy every 10 years.   Flexible sigmoidoscopy or colonoscopy.** / Every 5 years for a flexible sigmoidoscopy or every 10 years for a colonoscopy beginning at age 50 and continuing until age 75.   Hepatitis   C blood test.** / For all people born from 1945 through 1965 and any individual with known risks for hepatitis C.   Osteoporosis screening.** / A one-time screening for women ages 65 and over and women at risk for fractures or osteoporosis.   Skin self-exam. / Monthly.   Influenza immunization.** / Every year.   Pneumococcal polysaccharide immunization.** / 1 dose at age 65 (or older) if you have never been vaccinated.   Tetanus, diphtheria, pertussis (Tdap, Td) immunization. / A one-time dose of Tdap vaccine if you are over 65 and have contact with an infant, are a healthcare worker, or simply want to be protected from whooping cough. After that, you need a Td booster dose every 10 years.   Varicella immunization.** / Consult your caregiver.   Meningococcal immunization.** / Consult your caregiver.   Hepatitis A immunization.** / Consult your caregiver. 2 doses, 6 to 18 months apart.   Hepatitis B immunization.** / Check with your caregiver. 3 doses, usually over 6 months.  ** Family history and personal history of risk and conditions may change your caregiver's  recommendations. Document Released: 11/21/2001 Document Revised: 09/14/2011 Document Reviewed: 02/20/2011 [employer] Patient Information 2012 [employer]. 

## 2012-07-04 ENCOUNTER — Encounter: Payer: Self-pay | Admitting: Internal Medicine

## 2012-07-04 ENCOUNTER — Ambulatory Visit
Admission: RE | Admit: 2012-07-04 | Discharge: 2012-07-04 | Disposition: A | Discharge status: Home / Self Care | Payer: 59 | Source: Ambulatory Visit | Attending: Family Medicine | Admitting: Family Medicine

## 2012-07-04 DIAGNOSIS — Z780 Asymptomatic menopausal state: Secondary | ICD-10-CM

## 2012-07-05 LAB — URINE CULTURE

## 2012-07-29 ENCOUNTER — Encounter: Payer: 59 | Admitting: Internal Medicine

## 2012-10-05 ENCOUNTER — Emergency Department (HOSPITAL_COMMUNITY)
Admission: EM | Admit: 2012-10-05 | Discharge: 2012-10-05 | Disposition: A | Discharge status: Home / Self Care | Payer: 59 | Attending: Emergency Medicine | Admitting: Emergency Medicine

## 2012-10-05 ENCOUNTER — Emergency Department (HOSPITAL_COMMUNITY): Payer: 59

## 2012-10-05 ENCOUNTER — Encounter (HOSPITAL_COMMUNITY): Payer: Self-pay | Admitting: *Deleted

## 2012-10-05 ENCOUNTER — Other Ambulatory Visit (HOSPITAL_COMMUNITY): Payer: 59

## 2012-10-05 DIAGNOSIS — F17200 Nicotine dependence, unspecified, uncomplicated: Secondary | ICD-10-CM | POA: Insufficient documentation

## 2012-10-05 DIAGNOSIS — R202 Paresthesia of skin: Secondary | ICD-10-CM

## 2012-10-05 DIAGNOSIS — R209 Unspecified disturbances of skin sensation: Secondary | ICD-10-CM | POA: Insufficient documentation

## 2012-10-05 DIAGNOSIS — Z853 Personal history of malignant neoplasm of breast: Secondary | ICD-10-CM | POA: Insufficient documentation

## 2012-10-05 LAB — COMPREHENSIVE METABOLIC PANEL
ALT: 15 U/L (ref 0–35)
CO2: 27 mEq/L (ref 19–32)
Calcium: 10.3 mg/dL (ref 8.4–10.5)
Creatinine, Ser: 1 mg/dL (ref 0.50–1.10)
GFR calc Af Amer: 77 mL/min — ABNORMAL LOW (ref >90)
GFR calc non Af Amer: 66 mL/min — ABNORMAL LOW (ref >90)
Glucose, Bld: 91 mg/dL (ref 70–99)
Sodium: 138 mEq/L (ref 135–145)

## 2012-10-05 LAB — CBC WITH DIFFERENTIAL/PLATELET
HCT: 39.7 % (ref 36.0–46.0)
Hemoglobin: 12.9 g/dL (ref 12.0–15.0)
Lymphocytes Relative: 30 % (ref 12–46)
Lymphs Abs: 1.8 10*3/uL (ref 0.7–4.0)
Monocytes Absolute: 0.4 10*3/uL (ref 0.1–1.0)
Monocytes Relative: 7 % (ref 3–12)
Neutro Abs: 3.5 10*3/uL (ref 1.7–7.7)
WBC: 6 10*3/uL (ref 4.0–10.5)

## 2012-10-05 LAB — POCT I-STAT, CHEM 8
BUN: 10 mg/dL (ref 6–23)
Calcium, Ion: 1.27 mmol/L — ABNORMAL HIGH (ref 1.12–1.23)
Chloride: 108 mEq/L (ref 96–112)
Glucose, Bld: 95 mg/dL (ref 70–99)

## 2012-10-05 NOTE — ED Notes (Signed)
MD at bedside. 

## 2012-10-05 NOTE — ED Provider Notes (Signed)
History     CSN: [id number]  Arrival date & time 10/05/12  1313   First MD Initiated Contact with Patient 10/05/12 1738      Chief Complaint  Patient presents with  . Numbness    (Consider location/radiation/quality/duration/timing/severity/associated sxs/prior treatment) HPI Pt with sister who had a stroke recently states that she was leaning her hand against the right side of her face and then noticed pens and needle sensation to R lower face. No droop, speech difficulty. Sensation resolved shortly after. 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 Pt admits to anxiety. States she was waiting in triage with R arm resting on the arm of her chair and had paraesthesias to R 4th and 5th digits which resolved when moving arm off armrest. She is currently asymptomatic. Denies HA, neck pain, visual changes.  Past Medical History  Diagnosis Date  . Breast cancer 1999    2004    Past Surgical History  Procedure Date  . Abdominal hysterectomy 2006    TAH,BSO secondary bleeding and breast cancer--  . Mastectomy     bilateral---1999/2004    Family History  Problem Relation Age of Onset  . Aortic aneurysm    . Stroke Sister   . Hypertension Sister   . Hypertension      History  Substance Use Topics  . Smoking status: Current Every Day Smoker -- 0.5 packs/day for 27 years    Types: Cigarettes  . Smokeless tobacco: Never Used  . Alcohol Use: Yes    OB History    Grav Para Term Preterm Abortions TAB SAB Ect Mult Living                  Review of Systems  Constitutional: Negative for fever and chills.  HENT: Negative for facial swelling, neck pain and neck stiffness.   Eyes: Negative for visual disturbance.  Respiratory: Negative for shortness of breath.   Cardiovascular: Negative for chest pain.  Gastrointestinal: Negative for nausea, vomiting and abdominal pain.  Skin: Negative for rash and wound.    Neurological: Positive for numbness. Negative for dizziness, syncope, weakness, light-headedness and headaches.  All other systems reviewed and are negative.    Allergies  Penicillins  Home Medications  No current outpatient prescriptions on file.  BP 104/80  Pulse 81  Temp 98.8 F (37.1 C) (Oral)  Resp 18  Ht 5\' 6"  (1.676 m)  Wt 120 lb (54.432 kg)  BMI 19.37 kg/m2  SpO2 100%  Physical Exam  Nursing note and vitals reviewed. Constitutional: She is oriented to person, place, and time. She appears well-developed and well-nourished. No distress.  HENT:  Head: Normocephalic and atraumatic.  Mouth/Throat: Oropharynx is clear and moist.  Eyes: EOM are normal. Pupils are equal, round, and reactive to light.  Neck: Normal range of motion. Neck supple.       No meningismus, No midline TTP  Cardiovascular: Normal rate and regular rhythm.   Pulmonary/Chest: Effort normal and breath sounds normal. No respiratory distress. She has no wheezes. She has no rales. She exhibits no tenderness.  Abdominal: Soft. Bowel sounds are normal. She exhibits no mass. There is no tenderness. There is no rebound and no guarding.  Musculoskeletal: Normal range of motion. She exhibits no edema and no tenderness.  Neurological: She is alert and oriented to person, place, and time.       CN II-XII intact, 5/5 motor in  all ext, sensation intact, DTR equal bl.   Skin: Skin is warm and dry. No rash noted. No erythema.  Psychiatric: She has a normal mood and affect. Her behavior is normal.    ED Course  Procedures (including critical care time)  Labs Reviewed  POCT I-STAT, CHEM 8 - Abnormal; Notable for the following:    Calcium, Ion 1.27 (*)     All other components within normal limits  COMPREHENSIVE METABOLIC PANEL - Abnormal; Notable for the following:    Total Bilirubin 0.2 (*)     GFR calc non Af Amer 66 (*)     GFR calc Af Amer 77 (*)     All other components within normal limits  CBC WITH  DIFFERENTIAL   Ct Head Wo Contrast  10/05/2012  *RADIOLOGY REPORT*  Clinical Data: Face numbness.  Family history of strokes and aneurysms.  CT HEAD WITHOUT CONTRAST  Technique:  Contiguous axial images were obtained from the base of the skull through the vertex without contrast.  Comparison: Brain MRI 02/14/2011  Findings: There is no intra or extra-axial fluid collection or mass lesion.  The basilar cisterns and ventricles have a normal appearance.  There is no CT evidence for acute infarction or hemorrhage.  Bone windows show no acute findings.  IMPRESSION: Negative exam.   Original Report Authenticated By: Norva Pavlov, M.D.      1. Paresthesia       MDM  Doubt CNS cause of symptoms. Likely peripheral nerve compression and anxiety. Neg CT head. Will check electrolytes and reassure  Work up is neg. Return for concerns        Loren Racer, MD 10/05/12 2225

## 2012-10-05 NOTE — ED Notes (Signed)
Patient states she felt like her face was numb yesterday.  Patient states her face continues to feel funny on the right side.  Patient denies any numbness in her arms/legs.  Speech is clear.  No facial droop noted.  Patient denies headache

## 2012-10-05 NOTE — ED Notes (Signed)
Pt stated symptoms resolved and she is no longer having numbness and tingling in her face.

## 2012-10-05 NOTE — ED Provider Notes (Signed)
MSE was initiated and I personally evaluated the patient and placed orders (if any) at  1:36 PM on October 05, 2012.  The patient appears stable so that the remainder of the MSE may be completed by another provider. Right-sided facial numbness. It is the lower part of her face and states it feels it is the inside of her mouth. There does feel to be slightly thicker cheek on the right compared to left. She does have a strong family history of strokes aneurysms. She'll need further evaluation.  Juliet Rude. Rubin Payor, MD 10/05/12 [phone]

## 2013-04-19 ENCOUNTER — Emergency Department (HOSPITAL_COMMUNITY)
Admission: EM | Admit: 2013-04-19 | Discharge: 2013-04-19 | Disposition: A | Discharge status: Home / Self Care | Payer: 59 | Attending: Emergency Medicine | Admitting: Emergency Medicine

## 2013-04-19 ENCOUNTER — Encounter (HOSPITAL_COMMUNITY): Payer: Self-pay | Admitting: *Deleted

## 2013-04-19 DIAGNOSIS — IMO0001 Reserved for inherently not codable concepts without codable children: Secondary | ICD-10-CM | POA: Insufficient documentation

## 2013-04-19 DIAGNOSIS — F17200 Nicotine dependence, unspecified, uncomplicated: Secondary | ICD-10-CM | POA: Insufficient documentation

## 2013-04-19 DIAGNOSIS — Z880 Allergy status to penicillin: Secondary | ICD-10-CM | POA: Insufficient documentation

## 2013-04-19 DIAGNOSIS — Z853 Personal history of malignant neoplasm of breast: Secondary | ICD-10-CM | POA: Insufficient documentation

## 2013-04-19 DIAGNOSIS — I890 Lymphedema, not elsewhere classified: Secondary | ICD-10-CM | POA: Insufficient documentation

## 2013-04-19 MED ORDER — HYDROCODONE-ACETAMINOPHEN 5-325 MG PO TABS: 1.0000 | ORAL_TABLET | Freq: Four times a day (QID) | ORAL | Status: DC | PRN

## 2013-04-19 NOTE — ED Provider Notes (Signed)
History    CSN: [id number] Arrival date & time 04/19/13  1051  First MD Initiated Contact with Patient 04/19/13 1118     Chief Complaint  Patient presents with  . hx breast cancer, lymphadema, sent from pcp    (Consider location/radiation/quality/duration/timing/severity/associated sxs/prior Treatment) HPI Comments: Pt with remote hx of breast CA, cancer free x 9 years with hx of LUE lymphedema comes in with cc of upper extremity swelling and pain. She has had lymphedema for several years, over the past few days her swelling has worsened, and pain increased. She called her Cancer team, who asked that she just come to the ED. There is no n/v/f/c. No skin rash. She has had similar episodes of flareups in the past, current swelling is slightly worse than in the past. No hx of DVT, PE, and no risk factors for the same at this time.   The history is provided by the patient.   Past Medical History  Diagnosis Date  . Breast cancer 1999    2004   Past Surgical History  Procedure Laterality Date  . Abdominal hysterectomy  2006    TAH,BSO secondary bleeding and breast cancer--  . Mastectomy      bilateral---1999/2004   Family History  Problem Relation Age of Onset  . Aortic aneurysm    . Stroke Sister   . Hypertension Sister   . Hypertension     History  Substance Use Topics  . Smoking status: Current Every Day Smoker -- 0.50 packs/day for 27 years    Types: Cigarettes  . Smokeless tobacco: Never Used  . Alcohol Use: Yes   OB History   Grav Para Term Preterm Abortions TAB SAB Ect Mult Living                 Review of Systems  Constitutional: Negative for activity change.  HENT: Negative for neck pain.   Respiratory: Negative for shortness of breath.   Cardiovascular: Negative for chest pain.  Gastrointestinal: Negative for nausea, vomiting and abdominal pain.  Genitourinary: Negative for dysuria.  Musculoskeletal: Positive for myalgias.  Neurological: Negative for  headaches.    Allergies  Penicillins  Home Medications   Current Outpatient Rx  Name  Route  Sig  Dispense  Refill  . acetaminophen (TYLENOL) 500 MG tablet   Oral   Take 500 mg by mouth every 6 (six) hours as needed for pain.          BP 112/75  Pulse 95  Temp(Src) 97.6 F (36.4 C) (Oral)  Resp 16  SpO2 100% Physical Exam  Constitutional: She is oriented to person, place, and time. She appears well-developed.  HENT:  Head: Normocephalic and atraumatic.  Eyes: Conjunctivae and EOM are normal. Pupils are equal, round, and reactive to light.  Neck: Normal range of motion. Neck supple.  Cardiovascular: Normal rate, regular rhythm, normal heart sounds and intact distal pulses.   No murmur heard. Pulmonary/Chest: Effort normal. No respiratory distress. She has no wheezes.  Abdominal: Soft. Bowel sounds are normal. She exhibits no distension. There is no tenderness. There is no rebound and no guarding.  Musculoskeletal:  LUE is swollen compared to the other side. No signs of infection. ROM is intact. There is mild axillary lymphadenopathy   Neurological: She is alert and oriented to person, place, and time.  Skin: Skin is warm and dry.    ED Course  Procedures (including critical care time) Labs Reviewed - No data to display No results  found. No diagnosis found.  MDM  Pt comes in with cc of LUE swelling. Remote hx of cancer, no DVT, PE hx - and no existing risk factors. Pt has had lymphedema since the surgery several years back. No clinical concerns for DVT, in light of the hx patient is providing.  There is mild axillary lymphadenopathy.  Since she is a Cancer patient, i have requested to take simple measures for lymphedema, and asked her to see her pcp in 1 week if not better - for possible duplex US and possible consult to oncology.   Derwood Kaplan, MD 04/19/13 1221

## 2013-04-19 NOTE — ED Notes (Addendum)
Pt reports hx of breast cancer in 2004, double mastectomy.cancer free 9 years. 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 Pt reports her arm is typical always a little swollen, but has been noticeable more swollen and heavy, makes pts shoulder hurt x1 week. Pt has worn sleeve all week. Pain 5/10. Pt reports left arm drainage as well. Has not had this much swelling and draining in months.   Dr Cyndie Chime pts oncologist.  Pt called oncologist, call on md told pt to come to ed to get ultrasound.

## 2013-06-19 ENCOUNTER — Emergency Department (HOSPITAL_COMMUNITY)
Admission: EM | Admit: 2013-06-19 | Discharge: 2013-06-19 | Disposition: A | Discharge status: Home / Self Care | Payer: 59 | Attending: Emergency Medicine | Admitting: Emergency Medicine

## 2013-06-19 ENCOUNTER — Encounter (HOSPITAL_COMMUNITY): Payer: Self-pay | Admitting: Emergency Medicine

## 2013-06-19 DIAGNOSIS — Z853 Personal history of malignant neoplasm of breast: Secondary | ICD-10-CM | POA: Insufficient documentation

## 2013-06-19 DIAGNOSIS — F17200 Nicotine dependence, unspecified, uncomplicated: Secondary | ICD-10-CM | POA: Insufficient documentation

## 2013-06-19 DIAGNOSIS — R209 Unspecified disturbances of skin sensation: Secondary | ICD-10-CM | POA: Insufficient documentation

## 2013-06-19 DIAGNOSIS — R5383 Other fatigue: Secondary | ICD-10-CM

## 2013-06-19 DIAGNOSIS — R5381 Other malaise: Secondary | ICD-10-CM | POA: Insufficient documentation

## 2013-06-19 DIAGNOSIS — Z3202 Encounter for pregnancy test, result negative: Secondary | ICD-10-CM | POA: Insufficient documentation

## 2013-06-19 DIAGNOSIS — Z880 Allergy status to penicillin: Secondary | ICD-10-CM | POA: Insufficient documentation

## 2013-06-19 DIAGNOSIS — E860 Dehydration: Secondary | ICD-10-CM | POA: Insufficient documentation

## 2013-06-19 DIAGNOSIS — N179 Acute kidney failure, unspecified: Secondary | ICD-10-CM | POA: Insufficient documentation

## 2013-06-19 LAB — POCT I-STAT, CHEM 8
Calcium, Ion: 1.28 mmol/L — ABNORMAL HIGH (ref 1.12–1.23)
Chloride: 106 mEq/L (ref 96–112)
HCT: 42 % (ref 36.0–46.0)
Hemoglobin: 14.3 g/dL (ref 12.0–15.0)
TCO2: 27 mmol/L (ref 0–100)

## 2013-06-19 LAB — POCT PREGNANCY, URINE: Preg Test, Ur: NEGATIVE

## 2013-06-19 LAB — URINALYSIS, DIPSTICK ONLY
Glucose, UA: NEGATIVE mg/dL
Leukocytes, UA: NEGATIVE
Nitrite: NEGATIVE
Protein, ur: NEGATIVE mg/dL

## 2013-06-19 NOTE — ED Notes (Signed)
Patient with multiple complaints of fatigue, tingling sensation in right temple, and intermittent pain in left calf area.  Patient has a primary care MD and is due for her yearly but decided to come in for these recent symptoms.

## 2013-06-19 NOTE — ED Provider Notes (Signed)
TIME SEEN: 9:06 AM  CHIEF COMPLAINT: Fatigue  HPI: Patient is a 47 y.o. female with a history of breast cancer in remission for several years who presents emergency department with complaints of fatigue for the past week. She also states that she has several seconds of a tingling sensation in her temple and several seconds of intermittent pain in her left lateral calf. She denies any head pain or head injury. No persistent numbness, tingling or focal weakness. No chest pain or shortness of breath. No fever. No vomiting or diarrhea. No bloody stools or melena. Patient does report that she is taking care of her sister who has had a stroke and has not been getting much sleep recently and has been more stress than usual. Patient reports that because her sister and other family members have had strokes and aneurysms she was concerned about her symptoms and came to the emergency department to be evaluated.  ROS: See HPI Constitutional: no fever  Eyes: no drainage  ENT: no runny nose   Cardiovascular:  no chest pain  Resp: no SOB  GI: no vomiting GU: no dysuria Integumentary: no rash  Allergy: no hives  Musculoskeletal: no leg swelling  Neurological: no slurred speech ROS otherwise negative  PAST MEDICAL HISTORY/PAST SURGICAL HISTORY:  Past Medical History  Diagnosis Date  . Breast cancer 1999    2004    MEDICATIONS:  Prior to Admission medications   Medication Sig Start Date End Date Taking? Authorizing Provider  acetaminophen (TYLENOL) 500 MG tablet Take 500 mg by mouth every 6 (six) hours as needed for pain.    Historical Provider, MD  HYDROcodone-acetaminophen (NORCO/VICODIN) 5-325 MG per tablet Take 1 tablet by mouth every 6 (six) hours as needed for pain. 04/19/13   Derwood Kaplan, MD    ALLERGIES:  Allergies  Allergen Reactions  . Penicillins     unknown    SOCIAL HISTORY:  History  Substance Use Topics  . Smoking status: Current Every Day Smoker -- 0.50 packs/day for 27  years    Types: Cigarettes  . Smokeless tobacco: Never Used  . Alcohol Use: Yes    FAMILY HISTORY: Family History  Problem Relation Age of Onset  . Aortic aneurysm    . Stroke Sister   . Hypertension Sister   . Hypertension      EXAM: There were no vitals taken for this visit. CONSTITUTIONAL: Alert and oriented and responds appropriately to questions. Well-appearing; well-nourished, nontoxic, smiling and appropriate HEAD: Normocephalic EYES: Conjunctivae clear, PERRL ENT: normal nose; no rhinorrhea; moist mucous membranes; pharynx without lesions noted NECK: Supple, no meningismus, no LAD  CARD: RRR; S1 and S2 appreciated; no murmurs, no clicks, no rubs, no gallops RESP: Normal chest excursion without splinting or tachypnea; breath sounds clear and equal bilaterally; no wheezes, no rhonchi, no rales,  ABD/GI: Normal bowel sounds; non-distended; soft, non-tender, no rebound, no guarding BACK:  The back appears normal and is non-tender to palpation, there is no CVA tenderness EXT: Normal ROM in all joints; non-tender to palpation; no edema; normal capillary refill; no cyanosis    SKIN: Normal color for age and race; warm NEURO: Moves all extremities equally, normal gait, sensation to light touch intact diffusely, cranial nerves II through XII intact, strength 5/5 in all 4 extremities PSYCH: The patient's mood and manner are appropriate. Grooming and personal hygiene are appropriate.  MEDICAL DECISION MAKING: Patient likely with fatigue likely due to increased stress, decreased sleep. I am not concerned for any  cardiac etiology she has no risk factors for ACS and is not complaining of chest pain or difficulty breathing. Also not concerned for pulmonary embolus. No tachycardia or hypoxia. Patient has no pain or swelling in her left lower extremity currently. She reports that she has pain at the area where she crosses her legs. No signs or symptoms to suggest DVT at this time. No signs or  symptoms of cellulitis. I am not concerned for any intracranial pathology including aneurysm, head bleed or stroke. Patient has had a normal MRI of her brain and she is in 12 that showed no aneurysms. Have reassured patient and she reports feeling much better or ready. We'll check basic labs and urinalysis for possible electrolyte abnormality, anemia. Anticipate discharge home with PCP followup.  ED PROGRESS: Patient's labs do show mild elevation of her creatinine at 1.20. Possible mild dehydration. Electrolytes within normal limits. Hemoglobin normal. No signs or symptoms of infection. We'll instruct patient to increase her oral fluids and followup with her primary care physician. Given customary and usual return precautions.    Layla Maw Jackeline Gutknecht, DO 06/19/13 1048

## 2013-07-17 ENCOUNTER — Telehealth: Payer: Self-pay

## 2013-07-17 NOTE — Telephone Encounter (Signed)
Medication and allergies: done (not currently taking any Rx medications)  Pharmacy updated, uses n/a for 90 day supply Pharmacy updated, uses [facility] for local pharmacy  HM UTD: no Immunizations due: flu  A/P:  Last:  PAP:     Due Perform at visit     MMG: UTD 07/03/2013  Dexa: n/a  CCS: n/a DM: n/a HTN: n/a Lipids: n/a  To Discuss with Provider: Patient is having vaginal discharge Otherwise feels great

## 2013-07-18 ENCOUNTER — Encounter: Payer: Self-pay | Admitting: Family Medicine

## 2013-07-18 ENCOUNTER — Ambulatory Visit (INDEPENDENT_AMBULATORY_CARE_PROVIDER_SITE_OTHER): Payer: 59 | Admitting: Family Medicine

## 2013-07-18 ENCOUNTER — Other Ambulatory Visit (HOSPITAL_COMMUNITY)
Admission: RE | Admit: 2013-07-18 | Discharge: 2013-07-18 | Disposition: A | Discharge status: Home / Self Care | Payer: 59 | Source: Ambulatory Visit | Attending: Family Medicine | Admitting: Family Medicine

## 2013-07-18 VITALS — BP 128/76 | HR 94 | Temp 98.1°F | Ht 66.0 in | Wt 132.4 lb

## 2013-07-18 DIAGNOSIS — Z1211 Encounter for screening for malignant neoplasm of colon: Secondary | ICD-10-CM

## 2013-07-18 DIAGNOSIS — Z124 Encounter for screening for malignant neoplasm of cervix: Secondary | ICD-10-CM

## 2013-07-18 DIAGNOSIS — Z23 Encounter for immunization: Secondary | ICD-10-CM

## 2013-07-18 DIAGNOSIS — Z0000 Encounter for general adult medical examination without abnormal findings: Secondary | ICD-10-CM

## 2013-07-18 DIAGNOSIS — F17200 Nicotine dependence, unspecified, uncomplicated: Secondary | ICD-10-CM

## 2013-07-18 DIAGNOSIS — R82998 Other abnormal findings in urine: Secondary | ICD-10-CM

## 2013-07-18 DIAGNOSIS — Z01419 Encounter for gynecological examination (general) (routine) without abnormal findings: Secondary | ICD-10-CM | POA: Insufficient documentation

## 2013-07-18 LAB — HEMOCCULT GUIAC POC 1CARD (OFFICE)

## 2013-07-18 LAB — POCT URINALYSIS DIPSTICK
Bilirubin, UA: NEGATIVE
Blood, UA: NEGATIVE
Glucose, UA: NEGATIVE
Nitrite, UA: NEGATIVE
Urobilinogen, UA: 0.2
pH, UA: 5

## 2013-07-18 NOTE — Progress Notes (Signed)
Subjective:     [name] is a 47 y.o. female and is here for a comprehensive physical exam. The patient reports problems - stress living with sister.  History   Social History  . Marital Status: Single    Spouse Name: N/A    Number of Children: N/A  . Years of Education: N/A   Occupational History  . Not on file.   Social History Main Topics  . Smoking status: Current Every Day Smoker -- 0.50 packs/day for 27 years    Types: Cigarettes  . Smokeless tobacco: Never Used  . Alcohol Use: Yes  . Drug Use: No  . Sexual Activity: Not Currently   Other Topics Concern  . Not on file   Social History Narrative  . No narrative on file   Health Maintenance  Topic Date Due  . Pap Smear  04/20/2013  . Influenza Vaccine  05/09/2013  . Tetanus/tdap  10/10/2019    The following portions of the patient's history were reviewed and updated as appropriate:  She  has a past medical history of Breast cancer (1999). She  does not have any pertinent problems on file. She  has past surgical history that includes Abdominal hysterectomy (2006) and Mastectomy. Her family history includes Aortic aneurysm in an other family member; Hypertension in her sister and another family member; Stroke in her sister. She  reports that she has been smoking Cigarettes.  She has a 13.5 pack-year smoking history. She has never used smokeless tobacco. She reports that she drinks alcohol. She reports that she does not use illicit drugs. She has a current medication list which includes the following prescription(s): multivitamin with minerals. Current Outpatient Prescriptions on File Prior to Visit  Medication Sig Dispense Refill  . Multiple Vitamin (MULTIVITAMIN WITH MINERALS) TABS tablet Take 1 tablet by mouth 2 (two) times daily.       No current facility-administered medications on file prior to visit.   She is allergic to penicillins..  Review of Systems Review of Systems  Constitutional: Negative  for activity change, appetite change and fatigue.  HENT: Negative for hearing loss, congestion, tinnitus and ear discharge.  dentist q61m Eyes: Negative for visual disturbance (see optho q1y -- vision corrected to 20/20 with glasses).  Respiratory: Negative for cough, chest tightness and shortness of breath.   Cardiovascular: Negative for chest pain, palpitations and leg swelling.  Gastrointestinal: Negative for abdominal pain, diarrhea, constipation and abdominal distention.  Genitourinary: Negative for urgency, frequency, decreased urine volume and difficulty urinating.  Musculoskeletal: Negative for back pain, arthralgias and gait problem.  Skin: Negative for color change, pallor and rash.  Neurological: Negative for dizziness, light-headedness, numbness and headaches.  Hematological: Negative for adenopathy. Does not bruise/bleed easily.  Psychiatric/Behavioral: Negative for suicidal ideas, confusion, sleep disturbance, self-injury, dysphoric mood, decreased concentration and agitation.       Objective:    BP 128/76  Pulse 94  Temp(Src) 98.1 F (36.7 C) (Oral)  Ht 5\' 6"  (1.676 m)  Wt 132 lb 6.4 oz (60.056 kg)  BMI 21.38 kg/m2  SpO2 97% General appearance: alert, cooperative, appears stated age and no distress Head: Normocephalic, without obvious abnormality, atraumatic Eyes: conjunctivae/corneas clear. PERRL, EOM's intact. Fundi benign. Ears: normal TM's and external ear canals both ears Nose: Nares normal. Septum midline. Mucosa normal. No drainage or sinus tenderness. Throat: lips, mucosa, and tongue normal; teeth and gums normal Neck: no adenopathy, no carotid bruit, no JVD, supple, symmetrical, trachea midline and thyroid not enlarged,  symmetric, no tenderness/mass/nodules Back: symmetric, no curvature. ROM normal. No CVA tenderness. Lungs: clear to auscultation bilaterally Breasts: b//l mastectomy scar Heart: regular rate and rhythm, S1, S2 normal, no murmur, click, rub  or gallop Abdomen: soft, non-tender; bowel sounds normal; no masses,  no organomegaly Pelvic: not indicated; status post hysterectomy, negative ROS , vaginal smear and bme done.  No adenexal masses Extremities: extremities normal, atraumatic, no cyanosis or edema Pulses: 2+ and symmetric Skin: Skin color, texture, turgor normal. No rashes or lesions Lymph nodes: Cervical, supraclavicular, and axillary nodes normal. Neurologic: Alert and oriented X 3, normal strength and tone. Normal symmetric reflexes. Normal coordination and gait Psych-- no depresssion, no anxiety      Assessment:    Healthy female exam.     Plan:     ghm utd Check labs See After Visit Summary for Counseling Recommendations

## 2013-07-18 NOTE — Assessment & Plan Note (Signed)
Nicotine gum and inhaler given for pt to try

## 2013-07-18 NOTE — Patient Instructions (Signed)
Preventive Care for Adults, Female A healthy lifestyle and preventive care can promote health and wellness. Preventive health guidelines for women include the following key practices.  A routine yearly physical is a good way to check with your caregiver about your health and preventive screening. It is a chance to share any concerns and updates on your health, and to receive a thorough exam.  Visit your dentist for a routine exam and preventive care every 6 months. Brush your teeth twice a day and floss once a day. Good oral hygiene prevents tooth decay and gum disease.  The frequency of eye exams is based on your age, health, family medical history, use of contact lenses, and other factors. Follow your caregiver's recommendations for frequency of eye exams.  Eat a healthy diet. Foods like vegetables, fruits, whole grains, low-fat dairy products, and lean protein foods contain the nutrients you need without too many calories. Decrease your intake of foods high in solid fats, added sugars, and salt. Eat the right amount of calories for you.Get information about a proper diet from your caregiver, if necessary.  Regular physical exercise is one of the most important things you can do for your health. Most adults should get at least 150 minutes of moderate-intensity exercise (any activity that increases your heart rate and causes you to sweat) each week. In addition, most adults need muscle-strengthening exercises on 2 or more days a week.  Maintain a healthy weight. The body mass index (BMI) is a screening tool to identify possible weight problems. It provides an estimate of body fat based on height and weight. Your caregiver can help determine your BMI, and can help you achieve or maintain a healthy weight.For adults 20 years and older:  A BMI below 18.5 is considered underweight.  A BMI of 18.5 to 24.9 is normal.  A BMI of 25 to 29.9 is considered overweight.  A BMI of 30 and above is  considered obese.  Maintain normal blood lipids and cholesterol levels by exercising and minimizing your intake of saturated fat. Eat a balanced diet with plenty of fruit and vegetables. Blood tests for lipids and cholesterol should begin at age 20 and be repeated every 5 years. If your lipid or cholesterol levels are high, you are over 50, or you are at high risk for heart disease, you may need your cholesterol levels checked more frequently.Ongoing high lipid and cholesterol levels should be treated with medicines if diet and exercise are not effective.  If you smoke, find out from your caregiver how to quit. If you do not use tobacco, do not start.  If you are pregnant, do not drink alcohol. If you are breastfeeding, be very cautious about drinking alcohol. If you are not pregnant and choose to drink alcohol, do not exceed 1 drink per day. One drink is considered to be 12 ounces (355 mL) of beer, 5 ounces (148 mL) of wine, or 1.5 ounces (44 mL) of liquor.  Avoid use of street drugs. Do not share needles with anyone. Ask for help if you need support or instructions about stopping the use of drugs.  High blood pressure causes heart disease and increases the risk of stroke. Your blood pressure should be checked at least every 1 to 2 years. Ongoing high blood pressure should be treated with medicines if weight loss and exercise are not effective.  If you are 55 to 47 years old, ask your caregiver if you should take aspirin to prevent strokes.  Diabetes   screening involves taking a blood sample to check your fasting blood sugar level. This should be done once every 3 years, after age 45, if you are within normal weight and without risk factors for diabetes. Testing should be considered at a younger age or be carried out more frequently if you are overweight and have at least 1 risk factor for diabetes.  Breast cancer screening is essential preventive care for women. You should practice "breast  self-awareness." This means understanding the normal appearance and feel of your breasts and may include breast self-examination. Any changes detected, no matter how small, should be reported to a caregiver. Women in their 20s and 30s should have a clinical breast exam (CBE) by a caregiver as part of a regular health exam every 1 to 3 years. After age 40, women should have a CBE every year. Starting at age 40, women should consider having a mammography (breast X-ray test) every year. Women who have a family history of breast cancer should talk to their caregiver about genetic screening. Women at a high risk of breast cancer should talk to their caregivers about having magnetic resonance imaging (MRI) and a mammography every year.  The Pap test is a screening test for cervical cancer. A Pap test can show cell changes on the cervix that might become cervical cancer if left untreated. A Pap test is a procedure in which cells are obtained and examined from the lower end of the uterus (cervix).  Women should have a Pap test starting at age 21.  Between ages 21 and 29, Pap tests should be repeated every 2 years.  Beginning at age 30, you should have a Pap test every 3 years as long as the past 3 Pap tests have been normal.  Some women have medical problems that increase the chance of getting cervical cancer. Talk to your caregiver about these problems. It is especially important to talk to your caregiver if a new problem develops soon after your last Pap test. In these cases, your caregiver may recommend more frequent screening and Pap tests.  The above recommendations are the same for women who have or have not gotten the vaccine for human papillomavirus (HPV).  If you had a hysterectomy for a problem that was not cancer or a condition that could lead to cancer, then you no longer need Pap tests. Even if you no longer need a Pap test, a regular exam is a good idea to make sure no other problems are  starting.  If you are between ages 65 and 70, and you have had normal Pap tests going back 10 years, you no longer need Pap tests. Even if you no longer need a Pap test, a regular exam is a good idea to make sure no other problems are starting.  If you have had past treatment for cervical cancer or a condition that could lead to cancer, you need Pap tests and screening for cancer for at least 20 years after your treatment.  If Pap tests have been discontinued, risk factors (such as a new sexual partner) need to be reassessed to determine if screening should be resumed.  The HPV test is an additional test that may be used for cervical cancer screening. The HPV test looks for the virus that can cause the cell changes on the cervix. The cells collected during the Pap test can be tested for HPV. The HPV test could be used to screen women aged 30 years and older, and should   be used in women of any age who have unclear Pap test results. After the age of 30, women should have HPV testing at the same frequency as a Pap test.  Colorectal cancer can be detected and often prevented. Most routine colorectal cancer screening begins at the age of 50 and continues through age 75. However, your caregiver may recommend screening at an earlier age if you have risk factors for colon cancer. On a yearly basis, your caregiver may provide home test kits to check for hidden blood in the stool. Use of a small camera at the end of a tube, to directly examine the colon (sigmoidoscopy or colonoscopy), can detect the earliest forms of colorectal cancer. Talk to your caregiver about this at age 50, when routine screening begins. Direct examination of the colon should be repeated every 5 to 10 years through age 75, unless early forms of pre-cancerous polyps or small growths are found.  Hepatitis C blood testing is recommended for all people born from 1945 through 1965 and any individual with known risks for hepatitis C.  Practice  safe sex. Use condoms and avoid high-risk sexual practices to reduce the spread of sexually transmitted infections (STIs). STIs include gonorrhea, chlamydia, syphilis, trichomonas, herpes, HPV, and human immunodeficiency virus (HIV). Herpes, HIV, and HPV are viral illnesses that have no cure. They can result in disability, cancer, and death. Sexually active women aged 25 and younger should be checked for chlamydia. Older women with new or multiple partners should also be tested for chlamydia. Testing for other STIs is recommended if you are sexually active and at increased risk.  Osteoporosis is a disease in which the bones lose minerals and strength with aging. This can result in serious bone fractures. The risk of osteoporosis can be identified using a bone density scan. Women ages 65 and over and women at risk for fractures or osteoporosis should discuss screening with their caregivers. Ask your caregiver whether you should take a calcium supplement or vitamin D to reduce the rate of osteoporosis.  Menopause can be associated with physical symptoms and risks. Hormone replacement therapy is available to decrease symptoms and risks. You should talk to your caregiver about whether hormone replacement therapy is right for you.  Use sunscreen with sun protection factor (SPF) of 30 or more. Apply sunscreen liberally and repeatedly throughout the day. You should seek shade when your shadow is shorter than you. Protect yourself by wearing long sleeves, pants, a wide-brimmed hat, and sunglasses year round, whenever you are outdoors.  Once a month, do a whole body skin exam, using a mirror to look at the skin on your back. Notify your caregiver of new moles, moles that have irregular borders, moles that are larger than a pencil eraser, or moles that have changed in shape or color.  Stay current with required immunizations.  Influenza. You need a dose every fall (or winter). The composition of the flu vaccine  changes each year, so being vaccinated once is not enough.  Pneumococcal polysaccharide. You need 1 to 2 doses if you smoke cigarettes or if you have certain chronic medical conditions. You need 1 dose at age 65 (or older) if you have never been vaccinated.  Tetanus, diphtheria, pertussis (Tdap, Td). Get 1 dose of Tdap vaccine if you are younger than age 65, are over 65 and have contact with an infant, are a healthcare worker, are pregnant, or simply want to be protected from whooping cough. After that, you need a Td   booster dose every 10 years. Consult your caregiver if you have not had at least 3 tetanus and diphtheria-containing shots sometime in your life or have a deep or dirty wound.  HPV. You need this vaccine if you are a woman age 26 or younger. The vaccine is given in 3 doses over 6 months.  Measles, mumps, rubella (MMR). You need at least 1 dose of MMR if you were born in 1957 or later. You may also need a second dose.  Meningococcal. If you are age 19 to 21 and a first-year college student living in a residence hall, or have one of several medical conditions, you need to get vaccinated against meningococcal disease. You may also need additional booster doses.  Zoster (shingles). If you are age 60 or older, you should get this vaccine.  Varicella (chickenpox). If you have never had chickenpox or you were vaccinated but received only 1 dose, talk to your caregiver to find out if you need this vaccine.  Hepatitis A. You need this vaccine if you have a specific risk factor for hepatitis A virus infection or you simply wish to be protected from this disease. The vaccine is usually given as 2 doses, 6 to 18 months apart.  Hepatitis B. You need this vaccine if you have a specific risk factor for hepatitis B virus infection or you simply wish to be protected from this disease. The vaccine is given in 3 doses, usually over 6 months. Preventive Services / Frequency Ages 19 to 39  Blood  pressure check.** / Every 1 to 2 years.  Lipid and cholesterol check.** / Every 5 years beginning at age 20.  Clinical breast exam.** / Every 3 years for women in their 20s and 30s.  Pap test.** / Every 2 years from ages 21 through 29. Every 3 years starting at age 30 through age 65 or 70 with a history of 3 consecutive normal Pap tests.  HPV screening.** / Every 3 years from ages 30 through ages 65 to 70 with a history of 3 consecutive normal Pap tests.  Hepatitis C blood test.** / For any individual with known risks for hepatitis C.  Skin self-exam. / Monthly.  Influenza immunization.** / Every year.  Pneumococcal polysaccharide immunization.** / 1 to 2 doses if you smoke cigarettes or if you have certain chronic medical conditions.  Tetanus, diphtheria, pertussis (Tdap, Td) immunization. / A one-time dose of Tdap vaccine. After that, you need a Td booster dose every 10 years.  HPV immunization. / 3 doses over 6 months, if you are 26 and younger.  Measles, mumps, rubella (MMR) immunization. / You need at least 1 dose of MMR if you were born in 1957 or later. You may also need a second dose.  Meningococcal immunization. / 1 dose if you are age 19 to 21 and a first-year college student living in a residence hall, or have one of several medical conditions, you need to get vaccinated against meningococcal disease. You may also need additional booster doses.  Varicella immunization.** / Consult your caregiver.  Hepatitis A immunization.** / Consult your caregiver. 2 doses, 6 to 18 months apart.  Hepatitis B immunization.** / Consult your caregiver. 3 doses usually over 6 months. Ages 40 to 64  Blood pressure check.** / Every 1 to 2 years.  Lipid and cholesterol check.** / Every 5 years beginning at age 20.  Clinical breast exam.** / Every year after age 40.  Mammogram.** / Every year beginning at age 40   and continuing for as long as you are in good health. Consult with your  caregiver.  Pap test.** / Every 3 years starting at age 30 through age 65 or 70 with a history of 3 consecutive normal Pap tests.  HPV screening.** / Every 3 years from ages 30 through ages 65 to 70 with a history of 3 consecutive normal Pap tests.  Fecal occult blood test (FOBT) of stool. / Every year beginning at age 50 and continuing until age 75. You may not need to do this test if you get a colonoscopy every 10 years.  Flexible sigmoidoscopy or colonoscopy.** / Every 5 years for a flexible sigmoidoscopy or every 10 years for a colonoscopy beginning at age 50 and continuing until age 75.  Hepatitis C blood test.** / For all people born from 1945 through 1965 and any individual with known risks for hepatitis C.  Skin self-exam. / Monthly.  Influenza immunization.** / Every year.  Pneumococcal polysaccharide immunization.** / 1 to 2 doses if you smoke cigarettes or if you have certain chronic medical conditions.  Tetanus, diphtheria, pertussis (Tdap, Td) immunization.** / A one-time dose of Tdap vaccine. After that, you need a Td booster dose every 10 years.  Measles, mumps, rubella (MMR) immunization. / You need at least 1 dose of MMR if you were born in 1957 or later. You may also need a second dose.  Varicella immunization.** / Consult your caregiver.  Meningococcal immunization.** / Consult your caregiver.  Hepatitis A immunization.** / Consult your caregiver. 2 doses, 6 to 18 months apart.  Hepatitis B immunization.** / Consult your caregiver. 3 doses, usually over 6 months. Ages 65 and over  Blood pressure check.** / Every 1 to 2 years.  Lipid and cholesterol check.** / Every 5 years beginning at age 20.  Clinical breast exam.** / Every year after age 40.  Mammogram.** / Every year beginning at age 40 and continuing for as long as you are in good health. Consult with your caregiver.  Pap test.** / Every 3 years starting at age 30 through age 65 or 70 with a 3  consecutive normal Pap tests. Testing can be stopped between 65 and 70 with 3 consecutive normal Pap tests and no abnormal Pap or HPV tests in the past 10 years.  HPV screening.** / Every 3 years from ages 30 through ages 65 or 70 with a history of 3 consecutive normal Pap tests. Testing can be stopped between 65 and 70 with 3 consecutive normal Pap tests and no abnormal Pap or HPV tests in the past 10 years.  Fecal occult blood test (FOBT) of stool. / Every year beginning at age 50 and continuing until age 75. You may not need to do this test if you get a colonoscopy every 10 years.  Flexible sigmoidoscopy or colonoscopy.** / Every 5 years for a flexible sigmoidoscopy or every 10 years for a colonoscopy beginning at age 50 and continuing until age 75.  Hepatitis C blood test.** / For all people born from 1945 through 1965 and any individual with known risks for hepatitis C.  Osteoporosis screening.** / A one-time screening for women ages 65 and over and women at risk for fractures or osteoporosis.  Skin self-exam. / Monthly.  Influenza immunization.** / Every year.  Pneumococcal polysaccharide immunization.** / 1 dose at age 65 (or older) if you have never been vaccinated.  Tetanus, diphtheria, pertussis (Tdap, Td) immunization. / A one-time dose of Tdap vaccine if you are over   65 and have contact with an infant, are a healthcare worker, or simply want to be protected from whooping cough. After that, you need a Td booster dose every 10 years.  Varicella immunization.** / Consult your caregiver.  Meningococcal immunization.** / Consult your caregiver.  Hepatitis A immunization.** / Consult your caregiver. 2 doses, 6 to 18 months apart.  Hepatitis B immunization.** / Check with your caregiver. 3 doses, usually over 6 months. ** Family history and personal history of risk and conditions may change your caregiver's recommendations. Document Released: 11/21/2001 Document Revised: 12/18/2011  Document Reviewed: 02/20/2011 [employer] Patient Information 2014 [employer].  

## 2013-07-19 LAB — MICROALBUMIN / CREATININE URINE RATIO
Creatinine, Urine: 68.1 mg/dL
Microalb, Ur: 0.5 mg/dL (ref 0.00–1.89)

## 2013-07-20 LAB — URINE CULTURE: Organism ID, Bacteria: NO GROWTH

## 2013-07-21 ENCOUNTER — Encounter: Payer: Self-pay | Admitting: Family Medicine

## 2013-07-21 ENCOUNTER — Other Ambulatory Visit: Payer: 59

## 2013-07-21 LAB — CBC WITH DIFFERENTIAL/PLATELET
Basophils Relative: 0.4 % (ref 0.0–3.0)
Hemoglobin: 14.1 g/dL (ref 12.0–15.0)
Lymphocytes Relative: 24 % (ref 12.0–46.0)
Monocytes Relative: 5.2 % (ref 3.0–12.0)
Neutro Abs: 3.8 10*3/uL (ref 1.4–7.7)
Neutrophils Relative %: 63.7 % (ref 43.0–77.0)
RBC: 4.61 Mil/uL (ref 3.87–5.11)
WBC: 6 10*3/uL (ref 4.5–10.5)

## 2013-07-21 LAB — BASIC METABOLIC PANEL
Calcium: 9.6 mg/dL (ref 8.4–10.5)
GFR: 78.21 mL/min (ref >60.00)
Sodium: 141 mEq/L (ref 135–145)

## 2013-07-21 LAB — LIPID PANEL
HDL: 36 mg/dL — ABNORMAL LOW (ref >39.00)
LDL Cholesterol: 141 mg/dL — ABNORMAL HIGH (ref 0–99)
Total CHOL/HDL Ratio: 6

## 2013-07-21 LAB — HEPATIC FUNCTION PANEL
ALT: 10 U/L (ref 0–35)
Alkaline Phosphatase: 86 U/L (ref 39–117)
Bilirubin, Direct: 0 mg/dL (ref 0.0–0.3)
Total Bilirubin: 0.6 mg/dL (ref 0.3–1.2)
Total Protein: 7.6 g/dL (ref 6.0–8.3)

## 2013-07-24 MED ORDER — METRONIDAZOLE 500 MG PO TABS: 500.0000 mg | ORAL_TABLET | Freq: Two times a day (BID) | ORAL | Status: DC

## 2013-07-29 ENCOUNTER — Telehealth: Payer: Self-pay | Admitting: Family Medicine

## 2013-07-29 NOTE — Telephone Encounter (Signed)
Msg left to call the office     KP 

## 2013-07-29 NOTE — Telephone Encounter (Signed)
Patient states that she has a question regarding one of her medications and she says that she needs to talk to [name] about something. Patient would not go into details.

## 2013-07-30 NOTE — Telephone Encounter (Signed)
Patient called back to tell you to call her on her work phone number to reach her [phone]

## 2013-07-31 NOTE — Telephone Encounter (Signed)
Spoke with patient and she wanted to know if her urine was dark, I mae her aware it was due to the medications. She voiced understanding and a TOC apt has been scheduled     KP

## 2013-08-05 ENCOUNTER — Ambulatory Visit (INDEPENDENT_AMBULATORY_CARE_PROVIDER_SITE_OTHER): Payer: 59 | Admitting: Family Medicine

## 2013-08-05 ENCOUNTER — Encounter: Payer: Self-pay | Admitting: Family Medicine

## 2013-08-05 VITALS — BP 116/60 | HR 109 | Temp 98.4°F | Wt 132.8 lb

## 2013-08-05 DIAGNOSIS — N760 Acute vaginitis: Secondary | ICD-10-CM

## 2013-08-05 DIAGNOSIS — Z202 Contact with and (suspected) exposure to infections with a predominantly sexual mode of transmission: Secondary | ICD-10-CM

## 2013-08-05 NOTE — Patient Instructions (Signed)
Bacterial Vaginosis Bacterial vaginosis (BV) is a vaginal infection where the normal balance of bacteria in the vagina is disrupted. The normal balance is then replaced by an overgrowth of certain bacteria. There are several different kinds of bacteria that can cause BV. BV is the most common vaginal infection in women of childbearing age. CAUSES   The cause of BV is not fully understood. BV develops when there is an increase or imbalance of harmful bacteria.  Some activities or behaviors can upset the normal balance of bacteria in the vagina and put women at increased risk including:  Having a new sex partner or multiple sex partners.  Douching.  Using an intrauterine device (IUD) for contraception.  It is not clear what role sexual activity plays in the development of BV. However, women that have never had sexual intercourse are rarely infected with BV. Women do not get BV from toilet seats, bedding, swimming pools or from touching objects around them.  SYMPTOMS   Grey vaginal discharge.  A fish-like odor with discharge, especially after sexual intercourse.  Itching or burning of the vagina and vulva.  Burning or pain with urination.  Some women have no signs or symptoms at all. DIAGNOSIS  Your caregiver must examine the vagina for signs of BV. Your caregiver will perform lab tests and look at the sample of vaginal fluid through a microscope. They will look for bacteria and abnormal cells (clue cells), a pH test higher than 4.5, and a positive amine test all associated with BV.  RISKS AND COMPLICATIONS   Pelvic inflammatory disease (PID).  Infections following gynecology surgery.  Developing HIV.  Developing herpes virus. TREATMENT  Sometimes BV will clear up without treatment. However, all women with symptoms of BV should be treated to avoid complications, especially if gynecology surgery is planned. Female partners generally do not need to be treated. However, BV may spread  between female sex partners so treatment is helpful in preventing a recurrence of BV.   BV may be treated with antibiotics. The antibiotics come in either pill or vaginal cream forms. Either can be used with nonpregnant or pregnant women, but the recommended dosages differ. These antibiotics are not harmful to the baby.  BV can recur after treatment. If this happens, a second round of antibiotics will often be prescribed.  Treatment is important for pregnant women. If not treated, BV can cause a premature delivery, especially for a pregnant woman who had a premature birth in the past. All pregnant women who have symptoms of BV should be checked and treated.  For chronic reoccurrence of BV, treatment with a type of prescribed gel vaginally twice a week is helpful. HOME CARE INSTRUCTIONS   Finish all medication as directed by your caregiver.  Do not have sex until treatment is completed.  Tell your sexual partner that you have a vaginal infection. They should see their caregiver and be treated if they have problems, such as a mild rash or itching.  Practice safe sex. Use condoms. Only have 1 sex partner. PREVENTION  Basic prevention steps can help reduce the risk of upsetting the natural balance of bacteria in the vagina and developing BV:  Do not have sexual intercourse (be abstinent).  Do not douche.  Use all of the medicine prescribed for treatment of BV, even if the signs and symptoms go away.  Tell your sex partner if you have BV. That way, they can be treated, if needed, to prevent reoccurrence. SEEK MEDICAL CARE IF:     Your symptoms are not improving after 3 days of treatment.  You have increased discharge, pain, or fever. MAKE SURE YOU:   Understand these instructions.  Will watch your condition.  Will get help right away if you are not doing well or get worse. FOR MORE INFORMATION  Division of STD Prevention (DSTDP), Centers for Disease Control and Prevention:  www.cdc.gov/std [employer] Association (ASHA): www.ashastd.org  Document Released: 09/25/2005 Document Revised: 12/18/2011 Document Reviewed: 03/18/2009 [employer] Patient Information 2014 [employer].  

## 2013-08-05 NOTE — Assessment & Plan Note (Signed)
Retested today

## 2013-08-05 NOTE — Progress Notes (Signed)
  Subjective:    Patient ID: [name], female    DOB: 1965-10-31, 47 y.o.   MRN: [id number]  HPI Pt here recheck for trichomonas.  Pt has no symptoms--no complaints   Review of Systems    as above Objective:   Physical Exam BP 116/60  Pulse 109  Temp(Src) 98.4 F (36.9 C) (Oral)  Wt 132 lb 12.8 oz (60.238 kg)  BMI 21.44 kg/m2  SpO2 98% General appearance: alert, cooperative, appears stated age and no distress Abdomen: soft, non-tender; bowel sounds normal; no masses,  no organomegaly Pelvic: cervix normal in appearance, external genitalia normal, no cervical motion tenderness, positive findings: vaginal discharge:  scant, white and odorless and thin Extremities: extremities normal, atraumatic, no cyanosis or edema        Assessment & Plan:

## 2013-08-06 LAB — WET PREP BY MOLECULAR PROBE: Trichomonas vaginosis: NEGATIVE

## 2013-10-07 ENCOUNTER — Telehealth: Payer: Self-pay | Admitting: *Deleted

## 2013-10-07 MED ORDER — TRUFORM ARM SLEEVE S 15-20MMHG MISC: Status: DC

## 2013-10-07 MED ORDER — NONFORMULARY OR COMPOUNDED ITEM: Status: DC

## 2013-10-07 NOTE — Telephone Encounter (Signed)
Rx faxed.  [facility] to [facility] [phone] [address] [facility] [address] (Sleeve) [phone]

## 2013-10-07 NOTE — Telephone Encounter (Signed)
Patient called and stated that she no longer has to see her oncology doctor and would like to know if she could have a prescription for her compression sleeves and prosthetic bras. Please advise. SW

## 2013-10-07 NOTE — Telephone Encounter (Signed)
Rx's printed and given to [facility] to fax since she has the fax numbers.       KP

## 2013-10-07 NOTE — Telephone Encounter (Signed)
Ok to give rx for them

## 2013-10-20 ENCOUNTER — Other Ambulatory Visit: Payer: 59

## 2013-12-01 ENCOUNTER — Observation Stay (HOSPITAL_COMMUNITY)
Admission: EM | Admit: 2013-12-01 | Discharge: 2013-12-02 | Disposition: A | Discharge status: Home / Self Care | Payer: 59 | Attending: Internal Medicine | Admitting: Internal Medicine

## 2013-12-01 ENCOUNTER — Inpatient Hospital Stay (HOSPITAL_COMMUNITY): Payer: 59

## 2013-12-01 ENCOUNTER — Encounter (HOSPITAL_COMMUNITY): Payer: Self-pay | Admitting: Emergency Medicine

## 2013-12-01 ENCOUNTER — Emergency Department (HOSPITAL_COMMUNITY): Payer: 59

## 2013-12-01 DIAGNOSIS — Z9010 Acquired absence of unspecified breast and nipple: Secondary | ICD-10-CM | POA: Insufficient documentation

## 2013-12-01 DIAGNOSIS — F411 Generalized anxiety disorder: Secondary | ICD-10-CM

## 2013-12-01 DIAGNOSIS — Z853 Personal history of malignant neoplasm of breast: Secondary | ICD-10-CM

## 2013-12-01 DIAGNOSIS — G459 Transient cerebral ischemic attack, unspecified: Secondary | ICD-10-CM

## 2013-12-01 DIAGNOSIS — R209 Unspecified disturbances of skin sensation: Secondary | ICD-10-CM

## 2013-12-01 DIAGNOSIS — Z202 Contact with and (suspected) exposure to infections with a predominantly sexual mode of transmission: Secondary | ICD-10-CM

## 2013-12-01 DIAGNOSIS — N390 Urinary tract infection, site not specified: Secondary | ICD-10-CM

## 2013-12-01 DIAGNOSIS — Z9221 Personal history of antineoplastic chemotherapy: Secondary | ICD-10-CM | POA: Insufficient documentation

## 2013-12-01 DIAGNOSIS — R29810 Facial weakness: Principal | ICD-10-CM

## 2013-12-01 DIAGNOSIS — R109 Unspecified abdominal pain: Secondary | ICD-10-CM

## 2013-12-01 DIAGNOSIS — F17200 Nicotine dependence, unspecified, uncomplicated: Secondary | ICD-10-CM | POA: Diagnosis present

## 2013-12-01 DIAGNOSIS — E785 Hyperlipidemia, unspecified: Secondary | ICD-10-CM | POA: Insufficient documentation

## 2013-12-01 LAB — CBC WITH DIFFERENTIAL/PLATELET
Basophils Absolute: 0 K/uL (ref 0.0–0.1)
Basophils Relative: 0 % (ref 0–1)
Eosinophils Absolute: 0.4 K/uL (ref 0.0–0.7)
Eosinophils Relative: 5 % (ref 0–5)
HCT: 41.9 % (ref 36.0–46.0)
Hemoglobin: 14.4 g/dL (ref 12.0–15.0)
Lymphocytes Relative: 32 % (ref 12–46)
Lymphs Abs: 2.5 K/uL (ref 0.7–4.0)
MCH: 31 pg (ref 26.0–34.0)
MCHC: 34.4 g/dL (ref 30.0–36.0)
MCV: 90.3 fL (ref 78.0–100.0)
Monocytes Absolute: 0.6 K/uL (ref 0.1–1.0)
Monocytes Relative: 8 % (ref 3–12)
Neutro Abs: 4.3 K/uL (ref 1.7–7.7)
Neutrophils Relative %: 55 % (ref 43–77)
Platelets: 273 K/uL (ref 150–400)
RBC: 4.64 MIL/uL (ref 3.87–5.11)
RDW: 12.9 % (ref 11.5–15.5)
WBC: 7.8 K/uL (ref 4.0–10.5)

## 2013-12-01 LAB — I-STAT CHEM 8, ED
BUN: 10 mg/dL (ref 6–23)
Calcium, Ion: 1.15 mmol/L (ref 1.12–1.23)
Chloride: 103 meq/L (ref 96–112)
Creatinine, Ser: 1 mg/dL (ref 0.50–1.10)
Glucose, Bld: 110 mg/dL — ABNORMAL HIGH (ref 70–99)
HCT: 45 % (ref 36.0–46.0)
Hemoglobin: 15.3 g/dL — ABNORMAL HIGH (ref 12.0–15.0)
Potassium: 3.7 meq/L (ref 3.7–5.3)
Sodium: 141 meq/L (ref 137–147)
TCO2: 25 mmol/L (ref 0–100)

## 2013-12-01 LAB — CBG MONITORING, ED: Glucose-Capillary: 103 mg/dL — ABNORMAL HIGH (ref 70–99)

## 2013-12-01 MED ORDER — ACETAMINOPHEN 325 MG PO TABS: 650.0000 mg | ORAL_TABLET | ORAL | Status: DC | PRN

## 2013-12-01 MED ORDER — ENOXAPARIN SODIUM 40 MG/0.4ML ~~LOC~~ SOLN
40.0000 mg | SUBCUTANEOUS | Status: DC
  Filled 2013-12-01 (×2): qty 0.4

## 2013-12-01 MED ORDER — ONDANSETRON HCL 4 MG/2ML IJ SOLN: 4.0000 mg | Freq: Four times a day (QID) | INTRAMUSCULAR | Status: DC | PRN

## 2013-12-01 MED ORDER — ASPIRIN 325 MG PO TABS
325.0000 mg | ORAL_TABLET | Freq: Every day | ORAL | Status: DC
  Administered 2013-12-02: 325 mg via ORAL
  Filled 2013-12-01: qty 1

## 2013-12-01 MED ORDER — ASPIRIN 300 MG RE SUPP
300.0000 mg | Freq: Every day | RECTAL | Status: DC
  Filled 2013-12-01: qty 1

## 2013-12-01 MED ORDER — ACETAMINOPHEN 650 MG RE SUPP: 650.0000 mg | RECTAL | Status: DC | PRN

## 2013-12-01 NOTE — ED Notes (Signed)
CT informed that pt is ready.

## 2013-12-01 NOTE — ED Notes (Signed)
Code stroke not called at this time by EDP or Neuro PA. Awaiting neurology MD.

## 2013-12-01 NOTE — ED Notes (Signed)
CT called regarding delay states that they do not have a room at this time.

## 2013-12-01 NOTE — ED Notes (Signed)
Pt reports while at work today co-worker noticed that her right side of her face was drooping. Pt c/o tingling to right side back of head and heaviness to right side of face. Speech clear, equal grips.

## 2013-12-01 NOTE — ED Notes (Signed)
Neuro PA states that he wants Neurologist to see pt prior to CT

## 2013-12-01 NOTE — ED Notes (Signed)
Patient transported to X-ray 

## 2013-12-01 NOTE — Consult Note (Signed)
Referring Physician: Roderic Palau    Chief Complaint: Right  Facial droop  HPI:                                                                                                                                         [name] is an 48 y.o. female who has no past medical history other than breast cancer and smoking 1/2 pack per day. She states she had posterior HA yesterday but this cleared by his AM.  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  Her coworker noted she had a right facial droop and patient also noted something was not right. She was brought to [facility].  She continued to have a right facial droop and some decreased sensation in her right lower face.  No other symptoms.  Patient is very nervous at this time --her sister three years ago was brought to hospital with HA and found to have a stroke.   Date last known well: Date: 12/01/2013 Time last known well: Time: 10:00 tPA Given: No: minimal symptoms, somewhatunclear time of onset.   Past Medical History  Diagnosis Date  . Breast cancer 1999    2004    Past Surgical History  Procedure Laterality Date  . Abdominal hysterectomy  2006    TAH,BSO secondary bleeding and breast cancer--  . Mastectomy      bilateral---1999/2004    Family History  Problem Relation Age of Onset  . Aortic aneurysm    . Stroke Sister   . Hypertension Sister   . Hypertension     Social History:  reports that she has been smoking Cigarettes.  She has a 13.5 pack-year smoking history. She has never used smokeless tobacco. She reports that she drinks alcohol. She reports that she does not use illicit drugs.  Allergies:  Allergies  Allergen Reactions  . Penicillins Other (See Comments)    Childhood reaction    Medications:                                                                                                                           No current facility-administered medications for this  encounter.   Current Outpatient Prescriptions  Medication Sig Dispense Refill  . Elastic Bandages & Supports (TRUFORM ARM SLEEVE S 15-20MMHG) MISC Use 1 device on  both arms as needed  2 each  5  . NONFORMULARY OR COMPOUNDED ITEM Double Mastectomy Prosthetic Bra  1 each  5     ROS:                                                                                                                                       History obtained from the patient  General ROS: negative for - chills, fatigue, fever, night sweats, weight gain or weight loss Psychological ROS: negative for - behavioral disorder, hallucinations, memory difficulties, mood swings or suicidal ideation Ophthalmic ROS: negative for - blurry vision, double vision, eye pain or loss of vision ENT ROS: negative for - epistaxis, nasal discharge, oral lesions, sore throat, tinnitus or vertigo Allergy and Immunology ROS: negative for - hives or itchy/watery eyes Hematological and Lymphatic ROS: negative for - bleeding problems, bruising or swollen lymph nodes Endocrine ROS: negative for - galactorrhea, hair pattern changes, polydipsia/polyuria or temperature intolerance Respiratory ROS: negative for - cough, hemoptysis, shortness of breath or wheezing Cardiovascular ROS: negative for - chest pain, dyspnea on exertion, edema or irregular heartbeat Gastrointestinal ROS: negative for - abdominal pain, diarrhea, hematemesis, nausea/vomiting or stool incontinence Genito-Urinary ROS: negative for - dysuria, hematuria, incontinence or urinary frequency/urgency Musculoskeletal ROS: negative for - joint swelling or muscular weakness Neurological ROS: as noted in HPI Dermatological ROS: negative for rash and skin lesion changes  Neurologic Examination:                                                                                                      Blood pressure 130/91, pulse 127, resp. rate 18, height 5\' 6"  (1.676 m), weight 58.06 kg (128 lb),  SpO2 96.00%.  Mental Status: Alert, oriented, thought content appropriate.  Speech fluent without evidence of aphasia.  Able to follow 3 step commands without difficulty. Cranial Nerves: II: Discs flat bilaterally; Visual fields grossly normal, pupils equal, round, reactive to light and accommodation III,IV, VI: ptosis not present, extra-ocular motions intact bilaterally V,VII: smile asymmetric on the right, facial light touch sensation decreased on right lower face VIII: hearing normal bilaterally IX,X: gag reflex present XI: bilateral shoulder shrug XII: midline tongue extension without atrophy or fasciculations  Motor: Right : Upper extremity   5/5    Left:     Upper extremity   5/5  Lower extremity   5/5     Lower extremity   5/5 Tone and bulk:normal tone throughout; no atrophy noted Sensory: Pinprick  and light touch intact throughout, bilaterally Deep Tendon Reflexes:  Right: Upper Extremity   Left: Upper extremity   biceps (C-5 to C-6) 2/4   biceps (C-5 to C-6) 2/4 tricep (C7) 2/4    triceps (C7) 2/4 Brachioradialis (C6) 2/4  Brachioradialis (C6) 2/4  Lower Extremity Lower Extremity  quadriceps (L-2 to L-4) 2/4   quadriceps (L-2 to L-4) 2/4 Achilles (S1) 2/4   Achilles (S1) 2/4  Plantars: Right: downgoing   Left: downgoing Cerebellar: normal finger-to-nose,  normal heel-to-shin test Gait: normal CV: pulses palpable throughout    Lab Results: Basic Metabolic Panel: No results found for this basename: NA, K, CL, CO2, GLUCOSE, BUN, CREATININE, CALCIUM, MG, PHOS,  in the last 168 hours  Liver Function Tests: No results found for this basename: AST, ALT, ALKPHOS, BILITOT, PROT, ALBUMIN,  in the last 168 hours No results found for this basename: LIPASE, AMYLASE,  in the last 168 hours No results found for this basename: AMMONIA,  in the last 168 hours  CBC: No results found for this basename: WBC, NEUTROABS, HGB, HCT, MCV, PLT,  in the last 168 hours  Cardiac  Enzymes: No results found for this basename: CKTOTAL, CKMB, CKMBINDEX, TROPONINI,  in the last 168 hours  Lipid Panel: No results found for this basename: CHOL, TRIG, HDL, CHOLHDL, VLDL, LDLCALC,  in the last 168 hours  CBG: No results found for this basename: GLUCAP,  in the last 168 hours  Microbiology: Results for orders placed in visit on 08/05/13  WET PREP BY MOLECULAR PROBE     Status: None   Collection Time    08/05/13 12:18 PM      Result Value Ref Range Status   Candida species NEG  Negative Final   Trichomonas vaginosis NEG  Negative Final   Gardnerella vaginalis NEG  Negative Final    Coagulation Studies: No results found for this basename: LABPROT, INR,  in the last 72 hours  Imaging: No results found.     Assessment and plan discussed with with attending physician and they are in agreement.    Etta Quill PA-C [facility] [phone]  12/01/2013, 11:20 AM  I have seen and evaluated the patient. I have reviewed the above note and made appropriate changes.    Assessment: 48 y.o. female presenting to ED with new onset right facial droop and decreased sensation which has been present for at least 2 hours at this time. Given that she was not aware of it until someone else saw it, the exact time of onset is unknown. With history of smoking and familial history of stroke cannot rule out TIA/CVA.   Stroke Risk Factors - smoking  Recommend: TIA work-up   1. HgbA1c, fasting lipid panel 2. MRI, MRA  of the brain without contrast 3. PT consult, OT consult, Speech consult 4. Echocardiogram 5. Carotid dopplers 6. Prophylactic therapy-Antiplatelet med: Aspirin - dose 325 daily 7. Risk  Factor modification 8. Smoking cessation.   Roland Rack, MD [facility] [phone]  If 7pm- 7am, please page neurology on call at [phone].

## 2013-12-01 NOTE — ED Provider Notes (Signed)
CSN: [id number]     Arrival date & time 12/01/13  1041 History   First MD Initiated Contact with Patient 12/01/13 1059     Chief Complaint  Patient presents with  . Facial Droop     (Consider location/radiation/quality/duration/timing/severity/associated sxs/prior Treatment) Patient is a 48 y.o. female presenting with weakness. The history is provided by the patient (pt complains of facial drooping to day.  improving now). No language interpreter was used.  Weakness This is a new problem. The current episode started 1 to 2 hours ago. The problem occurs constantly. The problem has not changed since onset.Pertinent negatives include no chest pain, no abdominal pain and no headaches. Nothing aggravates the symptoms. Nothing relieves the symptoms. She has tried nothing for the symptoms.    Past Medical History  Diagnosis Date  . Breast cancer 1999    2004   Past Surgical History  Procedure Laterality Date  . Abdominal hysterectomy  2006    TAH,BSO secondary bleeding and breast cancer--  . Mastectomy      bilateral---1999/2004   Family History  Problem Relation Age of Onset  . Aortic aneurysm    . Stroke Sister   . Hypertension Sister   . Hypertension     History  Substance Use Topics  . Smoking status: Current Every Day Smoker -- 0.50 packs/day for 27 years    Types: Cigarettes  . Smokeless tobacco: Never Used  . Alcohol Use: Yes   OB History   Grav Para Term Preterm Abortions TAB SAB Ect Mult Living                 Review of Systems  Constitutional: Negative for appetite change and fatigue.  HENT: Negative for congestion, ear discharge and sinus pressure.        Facial weakness  Eyes: Negative for discharge.  Respiratory: Negative for cough.   Cardiovascular: Negative for chest pain.  Gastrointestinal: Negative for abdominal pain and diarrhea.  Genitourinary: Negative for frequency and hematuria.  Musculoskeletal: Negative for back pain.  Skin: Negative for rash.   Neurological: Positive for weakness. Negative for seizures and headaches.  Psychiatric/Behavioral: Negative for hallucinations.      Allergies  Penicillins  Home Medications   Current Outpatient Rx  Name  Route  Sig  Dispense  Refill  . Elastic Bandages & Supports (TRUFORM ARM SLEEVE S 15-20MMHG) MISC      Use 1 device on both arms as needed   2 each   5   . NONFORMULARY OR COMPOUNDED ITEM      Double Mastectomy Prosthetic Bra   1 each   5    BP 110/86  Pulse 98  Temp(Src) 98.3 F (36.8 C)  Resp 14  Ht 5\' 6"  (1.676 m)  Wt 128 lb (58.06 kg)  BMI 20.67 kg/m2  SpO2 100% Physical Exam  Constitutional: She is oriented to person, place, and time. She appears well-developed.  HENT:  Head: Normocephalic.  Mild right facial weakness  Eyes: Conjunctivae and EOM are normal. No scleral icterus.  Neck: Neck supple. No thyromegaly present.  Cardiovascular: Normal rate and regular rhythm.  Exam reveals no gallop and no friction rub.   No murmur heard. Pulmonary/Chest: No stridor. She has no wheezes. She has no rales. She exhibits no tenderness.  Abdominal: She exhibits no distension. There is no tenderness. There is no rebound.  Musculoskeletal: Normal range of motion. She exhibits no edema.  Lymphadenopathy:    She has no cervical adenopathy.  Neurological: She is oriented to person, place, and time. She exhibits normal muscle tone. Coordination normal.  Skin: No rash noted. No erythema.  Psychiatric: She has a normal mood and affect. Her behavior is normal.    ED Course  Procedures (including critical care time) Labs Review Labs Reviewed  CBG MONITORING, ED - Abnormal; Notable for the following:    Glucose-Capillary 103 (*)    All other components within normal limits  I-STAT CHEM 8, ED - Abnormal; Notable for the following:    Glucose, Bld 110 (*)    Hemoglobin 15.3 (*)    All other components within normal limits  CBC WITH DIFFERENTIAL   Imaging Review Ct  Head Wo Contrast  12/01/2013   CLINICAL DATA:  48 year old female with right facial droop. Tingling in the neck and headache. Initial encounter.  EXAM: CT HEAD WITHOUT CONTRAST  TECHNIQUE: Contiguous axial images were obtained from the base of the skull through the vertex without intravenous contrast.  COMPARISON:  10/05/2012.  FINDINGS: Bubbly opacity in the left maxillary sinus and polypoid opacity in the visible nasal cavity. Other Visualized paranasal sinuses and mastoids are clear. No acute osseous abnormality identified.  Visualized orbits and scalp soft tissues are within normal limits.  Cerebral volume is normal. No midline shift, ventriculomegaly, mass effect, evidence of mass lesion, intracranial hemorrhage or evidence of cortically based acute infarction. Gray-white matter differentiation is within normal limits throughout the brain. No suspicious intracranial vascular hyperdensity.  IMPRESSION: Stable and normal noncontrast CT appearance of the brain.   Electronically Signed   By: Lars Pinks M.D.   On: 12/01/2013 13:13    EKG Interpretation    Date/Time:  Monday December 01 2013 11:10:15 EST Ventricular Rate:  115 PR Interval:  170 QRS Duration: 70 QT Interval:  349 QTC Calculation: 483 R Axis:   62 Text Interpretation:  Sinus tachycardia Confirmed by Calvyn Kurtzman  MD, Novella Abraha ([id number]) on 12/01/2013 1:16:52 PM            MDM   Final diagnoses:  TIA (transient ischemic attack)       Maudry Diego, MD 12/04/13 1308

## 2013-12-01 NOTE — H&P (Signed)
[facility] History and Physical  [name] ZOX:[id number] DOB: 1965/12/16 DOA: 12/01/2013   PCP: Garnet Koyanagi, DO   Chief Complaint: Right facial droop  HPI:  48 year old female with a history of bilateral breast cancer status post bilateral mastectomy. She had her right mastectomy 1999, left mastectomy 2004. She is status post chemotherapy. Her tamoxifen was stopped after she had a hysterectomy. The patient presented with right facial droop that was noted around 10 AM on the day of admission. The patient had been complaining of a posterior headache yesterday, but this has improved. She was at work this morning lasting with a coworker when her coworker noted some facial droop. The patient went to s of anemia and verified at the right facial droop. In addition she also noticed decreased sensation in the right face. The patient denied any headache, visual disturbance, focal extremity weakness, dizziness, syncope. There is no chest discomfort, shortness of breath, nausea, vomiting, diarrhea, abdominal pain. The patient has been in her usual state of health. The patient still states that her right face feels "funny". However, it appears that her right facial droop has improved.  Upon arrival in the ED, the patient was seen by the neurologist. He recommended admission for full stroke workup. CT of the brain was negative for any acute abnormalities. The patient is hemodynamically stable. BMP and CBC are unremarkable. EKG shows sinus rhythm without any ST changes. Assessment/Plan: Right facial droop -concerned about stroke, TIA -MRI/MRA brain -Carotid Doppler -Echocardiogram -Hemoglobin A1c, lipid panel -PT/OT/ST -telemetry -ASA 325mg  daily Bilateral breast cancer  -Status post bilateral mastectomy with chemotherapy  -In remission  Tobacco abuse -Patient does not want  nicotine patch -Tobacco cessation discussed -Approximately 15-pack-year       Past Medical History  Diagnosis  Date  . Breast cancer 1999    2004   Past Surgical History  Procedure Laterality Date  . Abdominal hysterectomy  2006    TAH,BSO secondary bleeding and breast cancer--  . Mastectomy      bilateral---1999/2004   Social History:  reports that she has been smoking Cigarettes.  She has a 13.5 pack-year smoking history. She has never used smokeless tobacco. She reports that she drinks alcohol. She reports that she does not use illicit drugs.   Family History  Problem Relation Age of Onset  . Aortic aneurysm    . Stroke Sister   . Hypertension Sister   . Hypertension       Allergies  Allergen Reactions  . Penicillins Other (See Comments)    Childhood reaction      Prior to Admission medications   Medication Sig Start Date End Date Taking? Authorizing Provider  Elastic Bandages & Supports (TRUFORM ARM SLEEVE S 15-20MMHG) MISC Use 1 device on both arms as needed 10/07/13  Yes Rosalita Chessman, DO  NONFORMULARY OR COMPOUNDED ITEM Double Mastectomy Prosthetic Bra 10/07/13  Yes Rosalita Chessman, DO    Review of Systems:  Constitutional:  No weight loss, night sweats, Fevers, chills, fatigue.  Head&Eyes: No headache.  No vision loss.  No eye pain or scotoma ENT:  No Difficulty swallowing,Tooth/dental problems,Sore throat,  No ear ache, post nasal drip,  Cardio-vascular:  No chest pain, Orthopnea, PND, swelling in lower extremities,  dizziness, palpitations  GI:  No  abdominal pain, nausea, vomiting, diarrhea, loss of appetite, hematochezia, melena, heartburn, indigestion, Resp:  No shortness of breath with exertion or at rest. No cough. No coughing up of blood .No wheezing.No chest wall  deformity  Skin:  no rash or lesions.  GU:  no dysuria, change in color of urine, no urgency or frequency. No flank pain.  Musculoskeletal:  No joint pain or swelling. No decreased range of motion. No back pain.  Psych:  No change in mood or affect. No depression Neurologic: No headache,   no focal weakness, no vision loss. No syncope  Physical Exam: Filed Vitals:   12/01/13 1145 12/01/13 1215 12/01/13 1315 12/01/13 1322  BP: 119/86 114/83 110/86   Pulse: 107 102 98   Temp:    98.3 F (36.8 C)  Resp: 15 18 14    Height:      Weight:      SpO2: 100% 100% 100%    General:  A&O x 3, NAD, nontoxic, pleasant/cooperative Head/Eye: No conjunctival hemorrhage, no icterus, [place]/AT, No nystagmus ENT:  No icterus,  No thrush, good dentition, no pharyngeal exudate Neck:  No masses, no lymphadenpathy, no bruits CV:  RRR, no rub, no gallop, no S3 Lung:  CTAB, good air movement, no wheeze, no rhonchi Abdomen: soft/NT, +BS, nondistended, no peritoneal signs Ext: No cyanosis, No rashes, No petechiae, No lymphangitis, No edema Neuro: CNII-XII intact, strength 4/5 in bilateral upper and lower extremities, no dysmetria  Labs on Admission:  Basic Metabolic Panel:  Recent Labs Lab 12/01/13 1159  NA 141  K 3.7  CL 103  GLUCOSE 110*  BUN 10  CREATININE 1.00   Liver Function Tests: No results found for this basename: AST, ALT, ALKPHOS, BILITOT, PROT, ALBUMIN,  in the last 168 hours No results found for this basename: LIPASE, AMYLASE,  in the last 168 hours No results found for this basename: AMMONIA,  in the last 168 hours CBC:  Recent Labs Lab 12/01/13 1125 12/01/13 1159  WBC 7.8  --   NEUTROABS 4.3  --   HGB 14.4 15.3*  HCT 41.9 45.0  MCV 90.3  --   PLT 273  --    Cardiac Enzymes: No results found for this basename: CKTOTAL, CKMB, CKMBINDEX, TROPONINI,  in the last 168 hours BNP: No components found with this basename: POCBNP,  CBG:  Recent Labs Lab 12/01/13 1125  GLUCAP 103*    Radiological Exams on Admission: Ct Head Wo Contrast  12/01/2013   CLINICAL DATA:  48 year old female with right facial droop. Tingling in the neck and headache. Initial encounter.  EXAM: CT HEAD WITHOUT CONTRAST  TECHNIQUE: Contiguous axial images were obtained from the base of the  skull through the vertex without intravenous contrast.  COMPARISON:  10/05/2012.  FINDINGS: Bubbly opacity in the left maxillary sinus and polypoid opacity in the visible nasal cavity. Other Visualized paranasal sinuses and mastoids are clear. No acute osseous abnormality identified.  Visualized orbits and scalp soft tissues are within normal limits.  Cerebral volume is normal. No midline shift, ventriculomegaly, mass effect, evidence of mass lesion, intracranial hemorrhage or evidence of cortically based acute infarction. Gray-white matter differentiation is within normal limits throughout the brain. No suspicious intracranial vascular hyperdensity.  IMPRESSION: Stable and normal noncontrast CT appearance of the brain.   Electronically Signed   By: Lars Pinks M.D.   On: 12/01/2013 13:13    EKG: Independently reviewed. Sinus rhythm, no ST-T wave changes    Time spent:55 minutes Code Status:   FULL Family Communication:   Family at bedside   Ailyne Pawley, DO  [facility] Pager [phone]  If 7PM-7AM, please contact night-coverage [email or web address] Password [facility] 12/01/2013, 2:02 PM

## 2013-12-01 NOTE — ED Notes (Signed)
MD at bedside. 

## 2013-12-01 NOTE — ED Notes (Addendum)
Pt states that her rt side of her face is tingling and in the back of her neck as well. Pt states that my face feels heavy. Pt reports headache yesterday. Symptoms started at 1000 today

## 2013-12-01 NOTE — ED Notes (Signed)
Neuro PA at bedside.  

## 2013-12-01 NOTE — ED Notes (Signed)
CT states that they will return due to Neurology at bedside.

## 2013-12-02 DIAGNOSIS — F411 Generalized anxiety disorder: Secondary | ICD-10-CM

## 2013-12-02 DIAGNOSIS — G459 Transient cerebral ischemic attack, unspecified: Secondary | ICD-10-CM

## 2013-12-02 DIAGNOSIS — R29810 Facial weakness: Secondary | ICD-10-CM

## 2013-12-02 DIAGNOSIS — F17200 Nicotine dependence, unspecified, uncomplicated: Secondary | ICD-10-CM

## 2013-12-02 DIAGNOSIS — I6789 Other cerebrovascular disease: Secondary | ICD-10-CM

## 2013-12-02 LAB — LIPID PANEL
CHOLESTEROL: 183 mg/dL (ref 0–200)
HDL: 32 mg/dL — AB (ref >39)
LDL Cholesterol: 121 mg/dL — ABNORMAL HIGH (ref 0–99)
Total CHOL/HDL Ratio: 5.7 RATIO
Triglycerides: 148 mg/dL (ref <150)
VLDL: 30 mg/dL (ref 0–40)

## 2013-12-02 LAB — HEMOGLOBIN A1C
Hgb A1c MFr Bld: 5.6 % (ref <5.7)
Mean Plasma Glucose: 114 mg/dL (ref <117)

## 2013-12-02 MED ORDER — ATORVASTATIN CALCIUM 10 MG PO TABS
10.0000 mg | ORAL_TABLET | Freq: Every day | ORAL | Status: DC
  Filled 2013-12-02: qty 1

## 2013-12-02 MED ORDER — NICOTINE POLACRILEX 2 MG MT GUM
2.0000 mg | CHEWING_GUM | OROMUCOSAL | Status: DC | PRN
  Filled 2013-12-02: qty 1

## 2013-12-02 MED ORDER — ASPIRIN 81 MG PO TABS: 81.0000 mg | ORAL_TABLET | Freq: Every day | ORAL | Status: DC

## 2013-12-02 MED ORDER — ATORVASTATIN CALCIUM 10 MG PO TABS: 10.0000 mg | ORAL_TABLET | Freq: Every day | ORAL | Status: DC

## 2013-12-02 MED ORDER — ASPIRIN 325 MG PO TABS: 325.0000 mg | ORAL_TABLET | Freq: Every day | ORAL | Status: DC

## 2013-12-02 MED ORDER — NICOTINE POLACRILEX 2 MG MT GUM: 2.0000 mg | CHEWING_GUM | OROMUCOSAL | Status: DC | PRN

## 2013-12-02 NOTE — Progress Notes (Signed)
*  PRELIMINARY RESULTS* Vascular Ultrasound Carotid Duplex (Doppler) has been completed.  Preliminary findings: Bilateral:  1-39% ICA stenosis.  Vertebral artery flow is antegrade.      Landry Mellow, RDMS, RVT  12/02/2013, 9:24 AM

## 2013-12-02 NOTE — Evaluation (Signed)
Physical Therapy Evaluation Patient Details Name: [name] MRN: [id number] DOB: 09/16/1966 Today's Date: 12/02/2013 Time: 7628-3151 PT Time Calculation (min): 9 min  PT Assessment / Plan / Recommendation History of Present Illness  pt presents with R facial droop.    Clinical Impression  Pt up independent in room folding blankets and moving furniture.  No further PT needs at this time.  Will let OT know that pt is independent.      PT Assessment  Patent does not need any further PT services    Follow Up Recommendations  No PT follow up    Does the patient have the potential to tolerate intense rehabilitation      Barriers to Discharge        Equipment Recommendations  None recommended by PT    Recommendations for Other Services     Frequency      Precautions / Restrictions Precautions Precautions: None Restrictions Weight Bearing Restrictions: No   Pertinent Vitals/Pain Denied pain.        Mobility  Transfers Overall transfer level: Independent Equipment used: None Ambulation/Gait Ambulation/Gait assistance: Independent Assistive device: None Gait Pattern/deviations: WFL(Within Functional Limits) General Gait Details: pt up independent in room folding blankets and putting hide a bed back together to couch.   Modified Rankin (Stroke Patients Only) Pre-Morbid Rankin Score: No symptoms Modified Rankin: No symptoms    Exercises     PT Diagnosis:    PT Problem List:   PT Treatment Interventions:       PT Goals(Current goals can be found in the care plan section) Acute Rehab PT Goals PT Goal Formulation: No goals set, d/c therapy  Visit Information  Last PT Received On: 12/02/13 Assistance Needed: +1 History of Present Illness: pt presents with R facial droop.         [place] expects to be discharged to:: Private residence Prior Function Level of Independence: Independent Communication Communication: No  difficulties    Cognition  Cognition Arousal/Alertness: Awake/alert Behavior During Therapy: WFL for tasks assessed/performed Overall Cognitive Status: Within Functional Limits for tasks assessed    Extremity/Trunk Assessment Upper Extremity Assessment Upper Extremity Assessment: Overall WFL for tasks assessed Lower Extremity Assessment Lower Extremity Assessment: Overall WFL for tasks assessed Cervical / Trunk Assessment Cervical / Trunk Assessment: Normal   Balance    End of Session PT - End of Session Activity Tolerance: Patient tolerated treatment well Patient left:  (up in room) Nurse Communication: Mobility status  GP     Catarina Hartshorn, [place] 12/02/2013, 9:58 AM

## 2013-12-02 NOTE — Progress Notes (Signed)
   CARE MANAGEMENT NOTE 12/02/2013  Patient:  [name], [name]   Account Number:  [id number]  Date Initiated:  12/02/2013  Documentation initiated by:  Olga Coaster  Subjective/Objective Assessment:   ADMITTED FOR STROKE WORKUP     Action/Plan:   CM FOLLOWING FOR DCP   Anticipated DC Date:  12/06/2013   Anticipated DC Plan:  [place]  CM consult          Status of service:  In process, will continue to follow Medicare Important Message given?  NA - LOS <3 / Initial given by admissions (If response is "NO", the following Medicare IM given date fields will be blank)  Per UR Regulation:  Reviewed for med. necessity/level of care/duration of stay  Comments:  2/25/2015Mindi Slicker RN,BSN,MHA [phone]

## 2013-12-02 NOTE — Progress Notes (Signed)
Pt discharge education and instructions completed with pt and family at bedside. All denies any questions and voices understanding. Pt provided print out information on smoking cessation. Pt IV and telemetry including all lines removed from pt. Pt ambulated off unit with family and belongings at side.

## 2013-12-02 NOTE — Discharge Summary (Signed)
Physician Discharge Summary  [name] DSK:[id number] DOB: [birth year] DOA: 12/01/2013  PCP: Loreen Freud, DO  Admit date: 12/01/2013 Discharge date: 12/02/2013  Time spent: 35 minutes  Recommendations for Outpatient Follow-up:  1. Stop smoking 2. FLP, LFTs in 6 weeks  Discharge Diagnoses:  Active Problems:   TOBACCO USE   BREAST CANCER, HX OF   Facial droop   Sensory disturbance   Discharge Condition: improved  Diet recommendation: cardiac  Filed Weights   12/01/13 1054  Weight: 58.06 kg (128 lb)    History of present illness:  48 year old female with a history of bilateral breast cancer status post bilateral mastectomy. She had her right mastectomy 1999, left mastectomy 2004. She is status post chemotherapy. Her tamoxifen was stopped after she had a hysterectomy. The patient presented with right facial droop that was noted around 10 AM on the day of admission. The patient had been complaining of a posterior headache yesterday, but this has improved. She was at work this morning lasting with a coworker when her coworker noted some facial droop. The patient went to s of anemia and verified at the right facial droop. In addition she also noticed decreased sensation in the right face. The patient denied any headache, visual disturbance, focal extremity weakness, dizziness, syncope. There is no chest discomfort, shortness of breath, nausea, vomiting, diarrhea, abdominal pain. The patient has been in her usual state of health. The patient still states that her right face feels "funny". However, it appears that her right facial droop has improved.  Upon arrival in the ED, the patient was seen by the neurologist. He recommended admission for full stroke workup. CT of the brain was negative for any acute abnormalities. The patient is hemodynamically stable. BMP and CBC are unremarkable. EKG shows sinus rhythm without any ST changes   Hospital Course:  Most likely anxiety attack and not  TIA/CVA -MRI ok -echo Carotids ok -ASA 81 mg Treat HLD Stop smoking  Procedures: Echo: Study Conclusions  - Left ventricle: The cavity size was normal. Systolic function was normal. The estimated ejection fraction was in the range of 55% to 60%. Wall motion was normal; there were no regional wall motion abnormalities. Left ventricular diastolic function parameters were normal. - Atrial septum: A patent foramen ovale cannot be excluded. There was an atrial septal aneurysm. Carotids: Bilateral: 1-39% ICA stenosis. Vertebral artery flow is antegrade.    Consultations:  neuro  Discharge Exam: Filed Vitals:   12/02/13 0958  BP: 106/70  Pulse: 88  Temp: 98 F (36.7 C)  Resp: 20    General: A+Ox3, NAd Cardiovascular: rrr Respiratory: clear  Discharge Instructions  Discharge Orders   Future Orders Complete By Expires   Diet general  As directed    Discharge instructions  As directed    Comments:     Stop smoking FLP and LFTs 6 weeks due to new statin       Medication List         aspirin 325 MG tablet  Take 1 tablet (325 mg total) by mouth daily.     atorvastatin 10 MG tablet  Commonly known as:  LIPITOR  Take 1 tablet (10 mg total) by mouth daily at 6 PM.     nicotine polacrilex 2 MG gum  Commonly known as:  NICORETTE  Take 1 each (2 mg total) by mouth as needed for smoking cessation.     NONFORMULARY OR COMPOUNDED ITEM  Double Mastectomy Prosthetic Bra  TRUFORM ARM SLEEVE S 15-20MMHG Misc  Use 1 device on both arms as needed       Allergies  Allergen Reactions  . Penicillins Other (See Comments)    Childhood reaction       Follow-up Information   Follow up with Garnet Koyanagi, DO In 1 week.   Specialty:  Family Medicine   Contact information:   [phone] [place] [address] [phone]        The results of significant diagnostics from this hospitalization (including imaging, microbiology, ancillary and laboratory)  are listed below for reference.    Significant Diagnostic Studies: Dg Chest 2 View  12/01/2013   CLINICAL DATA:  History of breast cancer. Right facial numbness and droop.  EXAM: CHEST  2 VIEW  COMPARISON:  PA and lateral chest 12/14/2003.  FINDINGS: The patient is status post bilateral mastectomy and axillary dissection. The lungs are clear. Heart size is normal. Remote left clavicle fracture is noted.  IMPRESSION: No acute disease.  Stable compared to prior exam.   Electronically Signed   By: Inge Rise M.D.   On: 12/01/2013 15:18   Ct Head Wo Contrast  12/01/2013   CLINICAL DATA:  48 year old female with right facial droop. Tingling in the neck and headache. Initial encounter.  EXAM: CT HEAD WITHOUT CONTRAST  TECHNIQUE: Contiguous axial images were obtained from the base of the skull through the vertex without intravenous contrast.  COMPARISON:  10/05/2012.  FINDINGS: Bubbly opacity in the left maxillary sinus and polypoid opacity in the visible nasal cavity. Other Visualized paranasal sinuses and mastoids are clear. No acute osseous abnormality identified.  Visualized orbits and scalp soft tissues are within normal limits.  Cerebral volume is normal. No midline shift, ventriculomegaly, mass effect, evidence of mass lesion, intracranial hemorrhage or evidence of cortically based acute infarction. Gray-white matter differentiation is within normal limits throughout the brain. No suspicious intracranial vascular hyperdensity.  IMPRESSION: Stable and normal noncontrast CT appearance of the brain.   Electronically Signed   By: Lars Pinks M.D.   On: 12/01/2013 13:13   Mr Brain Wo Contrast  12/01/2013   CLINICAL DATA:  Transient facial weakness, now at baseline. History of breast cancer.  EXAM: MRI HEAD WITHOUT CONTRAST  MRA HEAD WITHOUT CONTRAST  TECHNIQUE: Multiplanar, multiecho pulse sequences of the brain and surrounding structures were obtained without intravenous contrast. Angiographic images of  the head were obtained using MRA technique without contrast.  COMPARISON:  CT head earlier in the day.  FINDINGS: MRI HEAD FINDINGS  No evidence for acute infarction, hemorrhage, mass lesion, hydrocephalus, or extra-axial fluid. No atrophy or white matter disease. Flow voids are maintained throughout the carotid, basilar, and vertebral arteries. There are no areas of chronic hemorrhage. Pituitary, pineal, and cerebellar tonsils unremarkable. No upper cervical lesions.  Negative orbits, and osseous structures. Chronic bilateral maxillary sinus disease. Chronic bilateral ethmoid sinus disease. 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 Trace left mastoid effusion.  Good general agreement prior CT.  MRA HEAD FINDINGS  Internal carotid arteries widely patent. Basilar artery widely patent with left greater than right vertebrals contributing. Fetal origin left PCA. 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 No intracranial stenosis or dissection. No visible intracranial aneurysm.  IMPRESSION: No acute or focal intracranial abnormality. Nasopharyngeal Thornwaldt cyst of moderate size, incidental finding.  No intracranial flow reducing lesion is evident.  Electronically Signed   By: Rolla Flatten M.D.   On: 12/01/2013 16:27   Mr Jodene Nam Head/brain Wo Cm  12/01/2013   CLINICAL DATA:  Transient facial weakness, now at baseline. History of breast cancer.  EXAM: MRI HEAD WITHOUT CONTRAST  MRA HEAD WITHOUT CONTRAST  TECHNIQUE: Multiplanar, multiecho pulse sequences of the brain and surrounding structures were obtained without intravenous contrast. Angiographic images of the head were obtained using MRA technique without contrast.  COMPARISON:  CT head earlier in the day.  FINDINGS: MRI HEAD FINDINGS  No evidence for acute infarction, hemorrhage, mass lesion, hydrocephalus, or extra-axial fluid. No atrophy  or white matter disease. Flow voids are maintained throughout the carotid, basilar, and vertebral arteries. There are no areas of chronic hemorrhage. Pituitary, pineal, and cerebellar tonsils unremarkable. No upper cervical lesions.  Negative orbits, and osseous structures. Chronic bilateral maxillary sinus disease. Chronic bilateral ethmoid sinus disease. 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 Trace left mastoid effusion.  Good general agreement prior CT.  MRA HEAD FINDINGS  Internal carotid arteries widely patent. Basilar artery widely patent with left greater than right vertebrals contributing. Fetal origin left PCA. 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 No intracranial stenosis or dissection. No visible intracranial aneurysm.  IMPRESSION: No acute or focal intracranial abnormality. Nasopharyngeal Thornwaldt cyst of moderate size, incidental finding.  No intracranial flow reducing lesion is evident.   Electronically Signed   By: Rolla Flatten M.D.   On: 12/01/2013 16:27    Microbiology: No results found for this or any previous visit (from the past 240 hour(s)).   Labs: Basic Metabolic Panel:  Recent Labs Lab 12/01/13 1159  NA 141  K 3.7  CL 103  GLUCOSE 110*  BUN 10  CREATININE 1.00   Liver Function Tests: No results found for this basename: AST, ALT, ALKPHOS, BILITOT, PROT, ALBUMIN,  in the last 168 hours No results found for this basename: LIPASE, AMYLASE,  in the last 168 hours No results found for this basename: AMMONIA,  in the last 168 hours CBC:  Recent Labs Lab 12/01/13 1125 12/01/13 1159  WBC 7.8  --   NEUTROABS 4.3  --   HGB 14.4 15.3*  HCT 41.9 45.0  MCV 90.3  --   PLT 273  --    Cardiac Enzymes: No results found for this basename: CKTOTAL, CKMB, CKMBINDEX, TROPONINI,  in the last 168 hours BNP: BNP (last 3 results) No results  found for this basename: PROBNP,  in the last 8760 hours CBG:  Recent Labs Lab 12/01/13 1125  GLUCAP 103*       Signed:  Santhosh Gulino  [facility] 12/02/2013, 11:33 AM

## 2013-12-02 NOTE — Progress Notes (Signed)
  Echocardiogram 2D Echocardiogram has been performed.  [place], [place] 12/02/2013, 11:01 AM

## 2013-12-02 NOTE — Progress Notes (Signed)
Stroke Team Progress Note  HISTORY [name] is an 48 y.o. female who has no past medical history other than breast cancer and smoking 1/2 pack per day. She states she had posterior HA yesterday but this cleared by his AM. 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 Her coworker noted she had a right facial droop and patient also noted something was not right. She was brought to [facility]. She continued to have a right facial droop and some decreased sensation in her right lower face. No other symptoms. Patient is very nervous at this time --her sister three years ago was brought to hospital with HA and found to have a stroke.  Patient was not administerd TPA secondary to minimum symptoms, unclear time of onset. She was admitted for further evaluation and treatment.  SUBJECTIVE No family is at the bedside.  Overall she feels her condition is completely resolved. She reports headache with increased stress recently while studying for a test.   OBJECTIVE Most recent Vital Signs: Filed Vitals:   12/02/13 0256 12/02/13 0400 12/02/13 0700 12/02/13 0958  BP: 96/63 105/73 95/61 106/70  Pulse: 70 70 71 88  Temp: 97.7 F (36.5 C) 97.6 F (36.4 C) 97.6 F (36.4 C) 98 F (36.7 C)  TempSrc: Oral Oral Oral Oral  Resp: 18 19 20 20   Height:      Weight:      SpO2: 97% 100% 99% 98%   CBG (last 3)   Recent Labs  12/01/13 1125  GLUCAP 103*    IV Fluid Intake:     MEDICATIONS  . aspirin  300 mg Rectal Daily   Or  . aspirin  325 mg Oral Daily  . atorvastatin  10 mg Oral q1800  . enoxaparin (LOVENOX) injection  40 mg Subcutaneous Q24H   PRN:  acetaminophen, acetaminophen, ondansetron (ZOFRAN) IV  CLINICALLY SIGNIFICANT STUDIES Basic Metabolic Panel:  Recent Labs Lab 12/01/13 1159  NA 141  K 3.7  CL 103  GLUCOSE 110*  BUN 10  CREATININE 1.00   Liver Function Tests: No results found for this basename: AST, ALT, ALKPHOS, BILITOT, PROT,  ALBUMIN,  in the last 168 hours CBC:  Recent Labs Lab 12/01/13 1125 12/01/13 1159  WBC 7.8  --   NEUTROABS 4.3  --   HGB 14.4 15.3*  HCT 41.9 45.0  MCV 90.3  --   PLT 273  --    Coagulation: No results found for this basename: LABPROT, INR,  in the last 168 hours Cardiac Enzymes: No results found for this basename: CKTOTAL, CKMB, CKMBINDEX, TROPONINI,  in the last 168 hours Urinalysis: No results found for this basename: COLORURINE, APPERANCEUR, LABSPEC, PHURINE, GLUCOSEU, HGBUR, BILIRUBINUR, KETONESUR, PROTEINUR, UROBILINOGEN, NITRITE, LEUKOCYTESUR,  in the last 168 hours Lipid Panel    Component Value Date/Time   CHOL 183 12/02/2013 0348   TRIG 148 12/02/2013 0348   HDL 32* 12/02/2013 0348   CHOLHDL 5.7 12/02/2013 0348   VLDL 30 12/02/2013 0348   LDLCALC 121* 12/02/2013 0348   HgbA1C  Lab Results  Component Value Date   HGBA1C 5.8 07/21/2013    Urine Drug Screen:   No results found for this basename: labopia, cocainscrnur, labbenz, amphetmu, thcu, labbarb    Alcohol Level: No results found for this basename: ETH,  in the last 168 hours  Dg Chest 2 View  12/01/2013   CLINICAL DATA:  History of breast cancer. Right facial numbness  and droop.  EXAM: CHEST  2 VIEW  COMPARISON:  PA and lateral chest 12/14/2003.  FINDINGS: The patient is status post bilateral mastectomy and axillary dissection. The lungs are clear. Heart size is normal. Remote left clavicle fracture is noted.  IMPRESSION: No acute disease.  Stable compared to prior exam.   Electronically Signed   By: Inge Rise M.D.   On: 12/01/2013 15:18   Ct Head Wo Contrast  12/01/2013   CLINICAL DATA:  48 year old female with right facial droop. Tingling in the neck and headache. Initial encounter.  EXAM: CT HEAD WITHOUT CONTRAST  TECHNIQUE: Contiguous axial images were obtained from the base of the skull through the vertex without intravenous contrast.  COMPARISON:  10/05/2012.  FINDINGS: Bubbly opacity in the left maxillary  sinus and polypoid opacity in the visible nasal cavity. Other Visualized paranasal sinuses and mastoids are clear. No acute osseous abnormality identified.  Visualized orbits and scalp soft tissues are within normal limits.  Cerebral volume is normal. No midline shift, ventriculomegaly, mass effect, evidence of mass lesion, intracranial hemorrhage or evidence of cortically based acute infarction. Gray-white matter differentiation is within normal limits throughout the brain. No suspicious intracranial vascular hyperdensity.  IMPRESSION: Stable and normal noncontrast CT appearance of the brain.   Electronically Signed   By: Lars Pinks M.D.   On: 12/01/2013 13:13   Mr Brain Wo Contrast  12/01/2013   CLINICAL DATA:  Transient facial weakness, now at baseline. History of breast cancer.  EXAM: MRI HEAD WITHOUT CONTRAST  MRA HEAD WITHOUT CONTRAST  TECHNIQUE: Multiplanar, multiecho pulse sequences of the brain and surrounding structures were obtained without intravenous contrast. Angiographic images of the head were obtained using MRA technique without contrast.  COMPARISON:  CT head earlier in the day.  FINDINGS: MRI HEAD FINDINGS  No evidence for acute infarction, hemorrhage, mass lesion, hydrocephalus, or extra-axial fluid. No atrophy or white matter disease. Flow voids are maintained throughout the carotid, basilar, and vertebral arteries. There are no areas of chronic hemorrhage. Pituitary, pineal, and cerebellar tonsils unremarkable. No upper cervical lesions.  Negative orbits, and osseous structures. Chronic bilateral maxillary sinus disease. Chronic bilateral ethmoid sinus disease. 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 Trace left mastoid effusion.  Good general agreement prior CT.  MRA HEAD FINDINGS  Internal carotid arteries widely patent. Basilar artery widely patent with left greater than right vertebrals contributing. Fetal origin left PCA. Hypoplastic  A1 ACA on the left, of doubtful significance, given the robust right anterior cerebral artery proximally. No intracranial stenosis or dissection. No visible intracranial aneurysm.  IMPRESSION: No acute or focal intracranial abnormality. Nasopharyngeal Thornwaldt cyst of moderate size, incidental finding.  No intracranial flow reducing lesion is evident.   Electronically Signed   By: Rolla Flatten M.D.   On: 12/01/2013 16:27   Mr Jodene Nam Head/brain Wo Cm  12/01/2013   CLINICAL DATA:  Transient facial weakness, now at baseline. History of breast cancer.  EXAM: MRI HEAD WITHOUT CONTRAST  MRA HEAD WITHOUT CONTRAST  TECHNIQUE: Multiplanar, multiecho pulse sequences of the brain and surrounding structures were obtained without intravenous contrast. Angiographic images of the head were obtained using MRA technique without contrast.  COMPARISON:  CT head earlier in the day.  FINDINGS: MRI HEAD FINDINGS  No evidence for acute infarction, hemorrhage, mass lesion, hydrocephalus, or extra-axial fluid. No atrophy or white matter disease. Flow voids are maintained throughout the carotid, basilar, and vertebral arteries. There are  no areas of chronic hemorrhage. Pituitary, pineal, and cerebellar tonsils unremarkable. No upper cervical lesions.  Negative orbits, and osseous structures. Chronic bilateral maxillary sinus disease. Chronic bilateral ethmoid sinus disease. 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 Trace left mastoid effusion.  Good general agreement prior CT.  MRA HEAD FINDINGS  Internal carotid arteries widely patent. Basilar artery widely patent with left greater than right vertebrals contributing. Fetal origin left PCA. 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 No intracranial stenosis or dissection. No visible intracranial aneurysm.  IMPRESSION: No acute or focal intracranial abnormality.  Nasopharyngeal Thornwaldt cyst of moderate size, incidental finding.  No intracranial flow reducing lesion is evident.   Electronically Signed   By: Rolla Flatten M.D.   On: 12/01/2013 16:27   2D Echocardiogram  Completed, results pending  Carotid Doppler  No evidence of hemodynamically significant internal carotid artery stenosis. Vertebral artery flow is antegrade.   Therapy Recommendations NO therapy needs  Physical Exam   Frail young african [place] lady not in distress.Awake alert. Afebrile. Head is nontraumatic. Neck is supple without bruit. Hearing is normal. Cardiac exam no murmur or gallop. Lungs are clear to auscultation. Distal pulses are well felt. Neurological Exam ;  Awake  Alert oriented x 3. Normal speech and language.eye movements full without nystagmus.fundi were not visualized. Vision acuity and fields appear normal. Hearing is normal. Palatal movements are normal. Face symmetric. Tongue midline. Normal strength, tone, reflexes and coordination. Normal sensation. Gait deferred. ASSESSMENT Ms. ELLIANA BAL is a 48 y.o. female presenting with right facial droop.  Imaging confirms no acute stroke. No signs of MS on MRI. No aneurysms or significant stenosis.  Patient is not felt to have a stroke diagnosis. She is, however, given her risk factors, at risk for stroke. On no antithrombotic prior to admission. Now on aspirin 325 mg orally every day for secondary stroke prevention. Patient with no resultant neuro deficits. Work up underway.  Hyperlipidemia, LDL 121, on no statin PTA, now on lipitor 10 mg daily, goal LDL < 100 Cigarette smoker 1/2 ppd.   Hx breast cancer 2004  Family history stroke (sister)  Hospital day # 1  TREATMENT/PLAN  Continue aspirin 81 mg orally every day for stroke prevention.  Stop smoking.  No further stroke workup indicated. Ongoing risk factor control by Primary Care Physician Stroke Service will sign off. Please call should any needs  arise. No neuro follow up required  Burnetta Sabin, MSN, RN, ANVP-BC, ANP-BC, GNP-BC [facility] Pager: [phone] 12/02/2013 10:11 AM  I have personally obtained a history, examined the patient, evaluated imaging results, and formulated the assessment and plan of care. I agree with the above. Antony Contras, MD

## 2013-12-04 NOTE — Progress Notes (Addendum)
Physical Therapy Note  Late entry for missed G-Codes.     12/30/2013 0905  PT Visit Information  Last PT Received On 30-Dec-2013  PT G-Codes **NOT FOR INPATIENT CLASS**  Functional Assessment Tool Used Clincial Judgement  Functional Limitation Mobility: Walking and moving around  Mobility: Walking and Moving Around Current Status [phone]) [facility]  Mobility: Walking and Moving Around Goal Status [phone]) [facility]  Mobility: Walking and Moving Around Discharge Status [phone]) [place], [place]

## 2014-04-22 ENCOUNTER — Ambulatory Visit (INDEPENDENT_AMBULATORY_CARE_PROVIDER_SITE_OTHER): Payer: 59 | Admitting: Family Medicine

## 2014-04-22 ENCOUNTER — Encounter: Payer: Self-pay | Admitting: Family Medicine

## 2014-04-22 VITALS — BP 114/82 | HR 101 | Temp 98.3°F | Wt 136.0 lb

## 2014-04-22 DIAGNOSIS — E785 Hyperlipidemia, unspecified: Secondary | ICD-10-CM

## 2014-04-22 DIAGNOSIS — G4700 Insomnia, unspecified: Secondary | ICD-10-CM

## 2014-04-22 MED ORDER — ATORVASTATIN CALCIUM 10 MG PO TABS: 10.0000 mg | ORAL_TABLET | Freq: Every day | ORAL | Status: DC

## 2014-04-22 MED ORDER — NONFORMULARY OR COMPOUNDED ITEM: Status: DC

## 2014-04-22 MED ORDER — ALPRAZOLAM 0.25 MG PO TABS: 0.2500 mg | ORAL_TABLET | Freq: Two times a day (BID) | ORAL | Status: DC | PRN

## 2014-04-22 NOTE — Patient Instructions (Signed)
Cholesterol  Cholesterol is a white, waxy, fat-like protein needed by your body in small amounts. The liver makes all the cholesterol you need. Cholesterol is carried from the liver by the blood through the blood vessels. Deposits of cholesterol (plaque) may build up on blood vessel walls. These make the arteries narrower and stiffer. Cholesterol plaques increase the risk for heart attack and stroke.   You cannot feel your cholesterol level even if it is very high. The only way to know it is high is with a blood test. Once you know your cholesterol levels, you should keep a record of the test results. Work with your health care provider to keep your levels in the desired range.   WHAT DO THE RESULTS MEAN?  · Total cholesterol is a rough measure of all the cholesterol in your blood.    · LDL is the so-called bad cholesterol. This is the type that deposits cholesterol in the walls of the arteries. You want this level to be low.    · HDL is the good cholesterol because it cleans the arteries and carries the LDL away. You want this level to be high.  · Triglycerides are fat that the body can either burn for energy or store. High levels are closely linked to heart disease.    WHAT ARE THE DESIRED LEVELS OF CHOLESTEROL?  · Total cholesterol below 200.    · LDL below 100 for people at risk, below 70 for those at very high risk.    · HDL above 50 is good, above 60 is best.    · Triglycerides below 150.    HOW CAN I LOWER MY CHOLESTEROL?  · Diet. Follow your diet programs as directed by your health care provider.    ¨ Choose fish or white meat chicken and turkey, roasted or baked. Limit fatty cuts of red meat, fried foods, and processed meats, such as sausage and lunch meats.    ¨ Eat lots of fresh fruits and vegetables.  ¨ Choose whole grains, beans, pasta, potatoes, and cereals.    ¨ Use only small amounts of olive, corn, or canola oils.    ¨ Avoid butter, mayonnaise, shortening, or palm kernel oils.  ¨ Avoid foods with  trans fats.    ¨ Drink skim or nonfat milk and eat low-fat or nonfat yogurt and cheeses. Avoid whole milk, cream, ice cream, egg yolks, and full-fat cheeses.    ¨ Healthy desserts include angel food cake, ginger snaps, animal crackers, hard candy, popsicles, and low-fat or nonfat frozen yogurt. Avoid pastries, cakes, pies, and cookies.    · Exercise. Follow your exercise programs as directed by your health care provider.    ¨ A regular program helps decrease LDL and raise HDL.    ¨ A regular program helps with weight control.    ¨ Do things that increase your activity level like gardening, walking, or taking the stairs. Ask your health care provider about how you can be more active in your daily life.    · Medicine. Take medicine as directed by your health care provider.    ¨ Medicine may be prescribed by your health care provider to help lower cholesterol and decrease the risk for heart disease.    ¨ If you have several risk factors, you may need medicine even if your levels are normal.  Document Released: 06/20/2001 Document Revised: 09/30/2013 Document Reviewed: 07/09/2013  [employer]® Patient Information ©2015 [employer]. This information is not intended to replace advice given to you by your health care provider. Make sure you discuss any questions you have with your health   care provider.

## 2014-04-22 NOTE — Progress Notes (Signed)
Subjective:    [name] is a 48 y.o. female here for follow up of dyslipidemia. The patient does not use medications that may worsen dyslipidemias (corticosteroids, progestins, anabolic steroids, diuretics, beta-blockers, amiodarone, cyclosporine, olanzapine). The patient exercises daily. The patient is not known to have coexisting coronary artery disease.   Cardiac Risk Factors Age > 45-female, > 55-female:  NO  Smoking:   NO  Sig. family hx of CHD*:  YES  +1  Hypertension:   NO  Diabetes:   NO  HDL < 35:   NO  HDL > 55:   NO  Total: 1   *- Sig. family h/o CHD per NCEP = MI or sudden death at <55yo in  father or other 41st-degree female relative, or <65yo in mother or  other 1st-degree female relative  The following portions of the patient's history were reviewed and updated as appropriate: allergies, current medications, past family history, past medical history, past social history, past surgical history and problem list.  Review of Systems Pertinent items are noted in HPI.    Objective:    BP 114/82  Pulse 101  Temp(Src) 98.3 F (36.8 C) (Oral)  Wt 136 lb (61.689 kg)  SpO2 96% General appearance: alert, cooperative, appears stated age and no distress Head: Normocephalic, without obvious abnormality, atraumatic Throat: lips, mucosa, and tongue normal; teeth and gums normal Neck: no adenopathy, no carotid bruit, no JVD, supple, symmetrical, trachea midline and thyroid not enlarged, symmetric, no tenderness/mass/nodules Lungs: clear to auscultation bilaterally Heart: regular rate and rhythm, S1, S2 normal, no murmur, click, rub or gallop Extremities: extremities normal, atraumatic, no cyanosis or edema  Lab Review Lab Results  Component Value Date   CHOL 183 12/02/2013   CHOL 198 07/21/2013   CHOL 204* 07/03/2012   HDL 32* 12/02/2013   HDL 36.00* 07/21/2013   HDL 35.30* 07/03/2012   LDLDIRECT 141.3 07/03/2012      Assessment:    Dyslipidemia as detailed above with  1 CHD risk factors using NCEP scheme above.  Target levels for LDL are: < 100 mg/dl (CHD or "CHD risk equivalent" is present)  Explained to the patient the respective contributions of genetics, diet, and exercise to lipid levels and the use of medication in severe cases which do not respond to lifestyle alteration. The patient's interest and motivation in making lifestyle changes seems excellent.    Plan:    The following changes are planned for the next 6 months, at which time the patient will return for repeat fasting lipids:  1. Dietary changes: Increase soluble fiber Plant sterols 2grams per day (e.g. Benecol): yes Reduce saturated fat, "trans" monounsaturated fatty acids, and cholesterol 2. Exercise changes:  con't exercise 3. Other treatment: None 4. Lipid-lowering medications: con't lipitor  (Recommended by NCEP after 3-6 mos of dietary therapy & lifestyle modification,  except if CHD is present or LDL well above 190.) 5. Hormone replacement therapy (patient is a postmenopausal  woman): no 6. Screening for secondary causes of dyslipidemias: None indicated 7. Lipid screening for relatives: na 8. Follow up: 6 months.  Note: The majority of the visit was spent in counseling on the pathophysiology and treatment of dyslipidemias. The total face-to-face time was in excess of 25 minutes.   1. Other and unspecified hyperlipidemia  - Basic metabolic panel; Future - Hepatic function panel; Future - Lipid panel; Future - atorvastatin (LIPITOR) 10 MG tablet; Take 1 tablet (10 mg total) by mouth daily at 6 PM.  Dispense: 30  tablet; Refill: 5  2. Insomnia And stress--- use xanax prn --rto prn - ALPRAZolam (XANAX) 0.25 MG tablet; Take 1 tablet (0.25 mg total) by mouth 2 (two) times daily as needed for anxiety.  Dispense: 30 tablet; Refill: 0

## 2014-04-22 NOTE — Progress Notes (Signed)
Pre visit review using our clinic review tool, if applicable. No additional management support is needed unless otherwise documented below in the visit note. 

## 2014-04-23 ENCOUNTER — Telehealth: Payer: Self-pay | Admitting: Family Medicine

## 2014-04-23 NOTE — Telephone Encounter (Signed)
Relevant patient education assigned to patient using Emmi. ° °

## 2014-05-05 ENCOUNTER — Other Ambulatory Visit (INDEPENDENT_AMBULATORY_CARE_PROVIDER_SITE_OTHER): Payer: 59

## 2014-05-05 DIAGNOSIS — E785 Hyperlipidemia, unspecified: Secondary | ICD-10-CM

## 2014-05-05 LAB — LIPID PANEL
CHOL/HDL RATIO: 4
CHOLESTEROL: 150 mg/dL (ref 0–200)
HDL: 36.5 mg/dL — AB (ref >39.00)
LDL Cholesterol: 90 mg/dL (ref 0–99)
NonHDL: 113.5
Triglycerides: 118 mg/dL (ref 0.0–149.0)
VLDL: 23.6 mg/dL (ref 0.0–40.0)

## 2014-05-05 LAB — HEPATIC FUNCTION PANEL
ALT: 33 U/L (ref 0–35)
AST: 30 U/L (ref 0–37)
Albumin: 4.2 g/dL (ref 3.5–5.2)
Alkaline Phosphatase: 97 U/L (ref 39–117)
BILIRUBIN DIRECT: 0.1 mg/dL (ref 0.0–0.3)
BILIRUBIN TOTAL: 0.7 mg/dL (ref 0.2–1.2)
Total Protein: 7.9 g/dL (ref 6.0–8.3)

## 2014-05-05 LAB — BASIC METABOLIC PANEL
BUN: 11 mg/dL (ref 6–23)
CALCIUM: 9.8 mg/dL (ref 8.4–10.5)
CHLORIDE: 105 meq/L (ref 96–112)
CO2: 30 mEq/L (ref 19–32)
CREATININE: 1.1 mg/dL (ref 0.4–1.2)
GFR: 71.98 mL/min (ref >60.00)
Glucose, Bld: 79 mg/dL (ref 70–99)
Potassium: 4.1 mEq/L (ref 3.5–5.1)
Sodium: 140 mEq/L (ref 135–145)

## 2014-05-06 ENCOUNTER — Ambulatory Visit: Payer: Self-pay | Admitting: Podiatry

## 2014-05-12 ENCOUNTER — Ambulatory Visit (INDEPENDENT_AMBULATORY_CARE_PROVIDER_SITE_OTHER): Payer: 59 | Admitting: Physician Assistant

## 2014-05-12 ENCOUNTER — Telehealth: Payer: Self-pay | Admitting: Family Medicine

## 2014-05-12 ENCOUNTER — Encounter: Payer: Self-pay | Admitting: Physician Assistant

## 2014-05-12 VITALS — BP 110/78 | HR 119 | Temp 98.5°F | Resp 18 | Wt 136.0 lb

## 2014-05-12 DIAGNOSIS — R5383 Other fatigue: Principal | ICD-10-CM

## 2014-05-12 DIAGNOSIS — R5381 Other malaise: Secondary | ICD-10-CM

## 2014-05-12 DIAGNOSIS — F411 Generalized anxiety disorder: Secondary | ICD-10-CM

## 2014-05-12 NOTE — Progress Notes (Signed)
Pre visit review using our clinic review tool, if applicable. No additional management support is needed unless otherwise documented below in the visit note. 

## 2014-05-12 NOTE — Telephone Encounter (Signed)
Encounter closed due to pt having an appt with provider.

## 2014-05-12 NOTE — Telephone Encounter (Signed)
Patient Information:  Caller Name: Zemirah  Phone: [phone]  Patient: [name], [name]  Gender: Female  DOB: 1966-02-02  Age: 48 Years  PCP: Rosalita Chessman.  Pregnant: No  Office Follow Up:  Does the office need to follow up with this patient?: No  Instructions For The Office: N/A  RN Note:  Called from work.  Feels "tired."  Slept for 4-5 hours last night with multiple interruptions due to worrying about sister who had CVA 4 yrs ago.  Feels like cannot concentrate. Eating but feels nauseated.  Tingling sensations present at times.  Mother had aneurism.  Advised to see MD today for moderate weakness interferring with work. No appointments remain at [facility] office.  Works in [place]; appointment scheduled for [phone] 05/12/14 with M. Berline Lopes PA at [place] office.    Symptoms  Reason For Call & Symptoms: Weakness, dizziness and fatigue when sitting,  Reports anxiety but never used Xanax.  Reviewed Health History In EMR: Yes  Reviewed Medications In EMR: Yes  Reviewed Allergies In EMR: Yes  Reviewed Surgeries / Procedures: Yes  Date of Onset of Symptoms: 05/11/2014  Treatments Tried: Drank water  Treatments Tried Worked: No OB / GYN:  LMP: Unknown  Guideline(s) Used:  Weakness (Generalized) and Fatigue  Disposition Per Guideline:   See Today in Office  Reason For Disposition Reached:   Moderate weakness (i.e., interferes with work, school, normal activities) and persists > 3 days  Advice Given:  Reassurance  Not drinking enough fluids and being a little dehydrated is a common cause of mild weakness.  During hot weather you sweat more and can become dehydrated more easily.  Fluids  : Drink several glasses of fruit juice, other clear fluids, or water. This will improve hydration and blood glucose.  Rest  : Lie down with feet elevated for 1 hour. This will improve blood flow and increase blood flow to the brain.  Call Back If:   Still feeling weak after 2 hours of rest and  fluids  Passes out (faints)  You become worse.  Patient Will Follow Care Advice:  YES  Appointment Scheduled:  05/12/2014 15:45:00 Appointment Scheduled Provider:  Other

## 2014-05-12 NOTE — Patient Instructions (Addendum)
Try your Xanax as directed to help relieve your panic symptoms.  I strongly encourage you to contact the behavioral health center in the pamphlet that I provided for you to have a meeting to discuss your anxiety.  We will call with your lab results when available.  If emergency symptoms discussed during visit developed, seek medical attention immediately.  Followup with your PCP in about 1 month to reassess anxiety, or for worsening or persistent symptoms despite treatment.    Panic Attacks Panic attacks are sudden, short feelings of great fear or discomfort. You may have them for no reason when you are relaxed, when you are uneasy (anxious), or when you are sleeping.  HOME CARE  Take all your medicines as told.  Check with your doctor before starting new medicines.  Keep all doctor visits. GET HELP IF:  You are not able to take your medicines as told.  Your symptoms do not get better.  Your symptoms get worse. GET HELP RIGHT AWAY IF:  Your attacks seem different than your normal attacks.  You have thoughts about hurting yourself or others.  You take panic attack medicine and you have a side effect. MAKE SURE YOU:  Understand these instructions.  Will watch your condition.  Will get help right away if you are not doing well or get worse. Document Released: 10/28/2010 Document Revised: 07/16/2013 Document Reviewed: 05/09/2013 [facility] Patient Information 2015 [place], [place]. This information is not intended to replace advice given to you by your health care provider. Make sure you discuss any questions you have with your health care provider.

## 2014-05-12 NOTE — Progress Notes (Addendum)
Subjective:    Patient ID: [name], female    DOB: 03-31-1966, 48 y.o.   MRN: [id number]  Dizziness This is a new problem. The current episode started yesterday. The problem occurs constantly. The problem has been waxing and waning. Associated symptoms include fatigue, nausea, numbness (states whole body feels numb a little right now) and weakness. Pertinent negatives include no abdominal pain, anorexia, arthralgias, change in bowel habit, chest pain, chills, congestion, coughing, diaphoresis, fever, headaches, joint swelling, myalgias, neck pain, rash, sore throat, swollen glands, urinary symptoms, vertigo, visual change or vomiting. Nothing aggravates the symptoms. Treatments tried: increased fluids. The treatment provided no relief.   States she feels as though much of her symptoms are related to stress. States her sister had a CVA in 2011 and she recently started driving again AMA. She feels that this is worrying her a great deal. Her PCP recently Rx'd her Xanax for her anxiety and insomnia related to anxiety, however she has not taken this as she is afraid it will cause her to sleep too much. Pt states that at work today she was thinking about her sister and immediately began feeling fatigued, weak, tingling, and lightheaded, and this worsened the more she thought about her sister and her symptoms.   Review of Systems  Constitutional: Positive for fatigue. Negative for fever, chills and diaphoresis.  HENT: Negative for congestion and sore throat.   Respiratory: Negative for cough and shortness of breath.   Cardiovascular: Negative for chest pain.  Gastrointestinal: Positive for nausea. Negative for vomiting, abdominal pain, diarrhea, constipation, blood in stool, anorexia and change in bowel habit.  Musculoskeletal: Negative for arthralgias, joint swelling, myalgias and neck pain.  Skin: Negative for rash.  Neurological: Positive for dizziness, weakness, light-headedness and numbness  (states whole body feels numb a little right now). Negative for vertigo, syncope, facial asymmetry (not currently, but is worried she might have this due to sisters history of stroke.), speech difficulty and headaches.       States she feels tingling "like my feet are asleep" but all throughout whole body.  Psychiatric/Behavioral: Positive for sleep disturbance and agitation. Negative for suicidal ideas, behavioral problems and self-injury. The patient is nervous/anxious.   All other systems reviewed and are negative.    Past Medical History  Diagnosis Date  . Breast cancer 1999    2004    History   Social History  . Marital Status: Single    Spouse Name: N/A    Number of Children: N/A  . Years of Education: N/A   Occupational History  . Not on file.   Social History Main Topics  . Smoking status: Current Every Day Smoker -- 0.50 packs/day for 27 years    Types: Cigarettes  . Smokeless tobacco: Never Used  . Alcohol Use: Yes  . Drug Use: No  . Sexual Activity: Not Currently   Other Topics Concern  . Not on file   Social History Narrative  . No narrative on file    Past Surgical History  Procedure Laterality Date  . Abdominal hysterectomy  2006    TAH,BSO secondary bleeding and breast cancer--  . Mastectomy      bilateral---1999/2004    Family History  Problem Relation Age of Onset  . Aortic aneurysm    . Stroke Sister   . Hypertension Sister   . Hypertension      Allergies  Allergen Reactions  . Penicillins Other (See Comments)    Childhood reaction  Current Outpatient Prescriptions on File Prior to Visit  Medication Sig Dispense Refill  . aspirin 81 MG tablet Take 1 tablet (81 mg total) by mouth daily.  30 tablet    . atorvastatin (LIPITOR) 10 MG tablet Take 1 tablet (10 mg total) by mouth daily at 6 PM.  30 tablet  5  . Elastic Bandages & Supports (TRUFORM ARM SLEEVE S 15-20MMHG) MISC Use 1 device on both arms as needed  2 each  5  . nicotine  polacrilex (NICORETTE) 2 MG gum Take 1 each (2 mg total) by mouth as needed for smoking cessation.  100 tablet  0  . NONFORMULARY OR COMPOUNDED ITEM Double Mastectomy Prosthetic  1 each  5  . ALPRAZolam (XANAX) 0.25 MG tablet Take 1 tablet (0.25 mg total) by mouth 2 (two) times daily as needed for anxiety.  30 tablet  0   No current facility-administered medications on file prior to visit.    EXAM: BP 110/78  Pulse 119  Temp(Src) 98.5 F (36.9 C) (Oral)  Resp 18  Wt 136 lb (61.689 kg)  SpO2 97%     Objective:   Physical Exam  Nursing note and vitals reviewed. Constitutional: She is oriented to person, place, and time. She appears well-developed and well-nourished. No distress.  HENT:  Head: Normocephalic and atraumatic.  Eyes: Conjunctivae and EOM are normal. Pupils are equal, round, and reactive to light.  Neck: Normal range of motion. Neck supple. No thyromegaly present.  Cardiovascular: Regular rhythm and intact distal pulses.   Tachycardic.  Pulmonary/Chest: Effort normal and breath sounds normal. No stridor. No respiratory distress. She exhibits no tenderness.  Lymphadenopathy:    She has no cervical adenopathy.  Neurological: She is alert and oriented to person, place, and time. She has normal reflexes. She displays normal reflexes. No cranial nerve deficit. She exhibits normal muscle tone. Coordination normal.  Skin: Skin is warm and dry. No rash noted. She is not diaphoretic. No erythema. No pallor.  Psychiatric: Her behavior is normal. Judgment and thought content normal.  Pt seems anxious/nervous, asking for repeated neurological exam, grip strength, etc. Worried about stroke due to sisters history.   Orthostatic vitals recorded and were negative for orthostatic hypotension.   Lab Results  Component Value Date   WBC 7.8 12/01/2013   HGB 15.3* 12/01/2013   HCT 45.0 12/01/2013   PLT 273 12/01/2013   GLUCOSE 79 05/05/2014   CHOL 150 05/05/2014   TRIG 118.0 05/05/2014    HDL 36.50* 05/05/2014   LDLDIRECT 141.3 07/03/2012   LDLCALC 90 05/05/2014   ALT 33 05/05/2014   AST 30 05/05/2014   NA 140 05/05/2014   K 4.1 05/05/2014   CL 105 05/05/2014   CREATININE 1.1 05/05/2014   BUN 11 05/05/2014   CO2 30 05/05/2014   TSH 0.74 07/21/2013   HGBA1C 5.6 12/02/2013   MICROALBUR 0.50 07/18/2013   EKG: normal EKG, normal sinus rhythm, unchanged from previous tracings.      Assessment & Plan:  Halo was seen today for fatigue.  Diagnoses and associated orders for this visit:  Other malaise and fatigue Comments: Orthostatic BPs normal. Will obtain TSH, CBC, B12 - CBC with Differential - TSH - Vitamin B12 - EKG 12-Lead  Anxiety state, unspecified Comments: Advised to try her Xanax when she gets home. Schedule appointment with behavioral health to discuss stress. - EKG 12-Lead    Stress episode at work today sounded consistent with a panic attack. Pt stated she felt  better at the end of the visit than at work. Advised pt to try taking the xanax this evening to see if it relieves her symptoms.  Pt also advised to schedule appointment with behavioral health to discuss her stress, pt seemed amenable to this.  Pt was tachycardic, 119 at beginning of visit, 108 during exam, and 86 during EKG at the end of visit. EKG was within normal limits.  Return precautions provided, and patient handout on panic attacks.  Plan to follow up in about 1 month with PCP to reassess anxiety, or for worsening or persistent symptoms despite treatment.  Patient Instructions  Try your Xanax as directed to help relieve your panic symptoms.  I strongly encourage you to contact the behavioral health center in the pamphlet that I provided for you to have a meeting to discuss your anxiety.  We will call with your lab results when available.  If emergency symptoms discussed during visit developed, seek medical attention immediately.  Followup with your PCP in about 1 month to reassess  anxiety, or for worsening or persistent symptoms despite treatment.

## 2014-05-13 LAB — CBC WITH DIFFERENTIAL/PLATELET
BASOS ABS: 0.1 10*3/uL (ref 0.0–0.1)
BASOS PCT: 1 % (ref 0.0–3.0)
Eosinophils Absolute: 0.2 10*3/uL (ref 0.0–0.7)
Eosinophils Relative: 3.7 % (ref 0.0–5.0)
HCT: 40.9 % (ref 36.0–46.0)
Hemoglobin: 14 g/dL (ref 12.0–15.0)
Lymphocytes Relative: 24.1 % (ref 12.0–46.0)
Lymphs Abs: 1.6 10*3/uL (ref 0.7–4.0)
MCHC: 34.1 g/dL (ref 30.0–36.0)
MCV: 89.5 fl (ref 78.0–100.0)
Monocytes Absolute: 0.4 10*3/uL (ref 0.1–1.0)
Monocytes Relative: 5.4 % (ref 3.0–12.0)
NEUTROS PCT: 65.8 % (ref 43.0–77.0)
Neutro Abs: 4.3 10*3/uL (ref 1.4–7.7)
Platelets: 248 10*3/uL (ref 150.0–400.0)
RBC: 4.57 Mil/uL (ref 3.87–5.11)
RDW: 13.1 % (ref 11.5–15.5)
WBC: 6.5 10*3/uL (ref 4.0–10.5)

## 2014-05-13 LAB — TSH: TSH: 0.13 u[IU]/mL — AB (ref 0.35–4.50)

## 2014-05-13 LAB — VITAMIN B12: Vitamin B-12: 258 pg/mL (ref 211–911)

## 2014-05-13 NOTE — Addendum Note (Signed)
Addended by: Elmer Picker on: 05/13/2014 10:53 AM   Modules accepted: Orders

## 2014-05-14 ENCOUNTER — Ambulatory Visit: Payer: 59

## 2014-05-14 DIAGNOSIS — R5381 Other malaise: Secondary | ICD-10-CM

## 2014-05-14 DIAGNOSIS — R5383 Other fatigue: Principal | ICD-10-CM

## 2014-05-14 LAB — T3, FREE: T3, Free: 3.1 pg/mL (ref 2.3–4.2)

## 2014-05-14 LAB — T4, FREE: FREE T4: 1.14 ng/dL (ref 0.60–1.60)

## 2014-05-18 ENCOUNTER — Telehealth: Payer: Self-pay | Admitting: Family Medicine

## 2014-05-18 NOTE — Telephone Encounter (Signed)
Pt states she was calling you for her results. She can be reached at work till 5 at [phone]

## 2014-05-18 NOTE — Telephone Encounter (Signed)
Called and spoke with pt about labs results and pt is aware.  See result note.

## 2014-06-12 ENCOUNTER — Other Ambulatory Visit: Payer: Self-pay | Admitting: Family Medicine

## 2014-06-29 ENCOUNTER — Encounter: Payer: Self-pay | Admitting: Podiatry

## 2014-06-29 ENCOUNTER — Ambulatory Visit (INDEPENDENT_AMBULATORY_CARE_PROVIDER_SITE_OTHER): Payer: 59 | Admitting: Podiatry

## 2014-06-29 VITALS — BP 126/80 | HR 97 | Resp 13 | Ht 66.0 in | Wt 136.0 lb

## 2014-06-29 DIAGNOSIS — B351 Tinea unguium: Secondary | ICD-10-CM

## 2014-06-29 NOTE — Progress Notes (Signed)
   Subjective:    Patient ID: [name], female    DOB: 01/24/1966, 48 y.o.   MRN: [id number]  HPI Comments: N corn L right 4th interdigital space D over 1 month O C soft, white skin A constant wear of shoes and socks T home surgery, H2O2, and padding   Patient also has concern about her toenails that are thick, discolored and hard to trim. She said no treatment for these nails, however, has an interest in evaluation and treatment.  Review of Systems  All other systems reviewed and are negative.      Objective:   Physical Exam Orientated x3 black female  Vascular: DP and PT pulses 2/4 bilaterally  Neurological: Sensation to 10 g monofilament wire intact 5/5 bilaterally Ankle reflex equal and reactive bilaterally  Dermatological: The toenails are brittle, discolored (brown, black) hypertrophied 6-10 Macerated tissue fourth right with space  Musculoskeletal: Adductovarus fifth toes bilaterally       Assessment & Plan:   Assessment: Satisfactory neurovascular status Tinea or yeast infection fourth right webspace Onychomycoses x10  Plan: Dispensed Castellani paint to apply the fourth right webspace twice a day Nail fragments obtained today for PAS and fungal culture  Notify patient upon results of lab

## 2014-06-29 NOTE — Patient Instructions (Signed)
Apply paint to the fourth right webspace twice a day until the macerated/white tissue is gone

## 2014-06-30 ENCOUNTER — Encounter: Payer: Self-pay | Admitting: Podiatry

## 2014-12-18 ENCOUNTER — Other Ambulatory Visit: Payer: Self-pay | Admitting: Family Medicine

## 2014-12-18 NOTE — Telephone Encounter (Signed)
Last filled: 06/12/14 Amt: 90, 1 Last OV:  04/22/14 Med filled x 1 month.

## 2014-12-20 ENCOUNTER — Ambulatory Visit (HOSPITAL_COMMUNITY)
Admission: RE | Admit: 2014-12-20 | Discharge: 2014-12-20 | Disposition: A | Discharge status: Home / Self Care | Payer: 59 | Source: Ambulatory Visit | Attending: Family Medicine | Admitting: Family Medicine

## 2014-12-20 ENCOUNTER — Ambulatory Visit (INDEPENDENT_AMBULATORY_CARE_PROVIDER_SITE_OTHER): Payer: 59 | Admitting: Family Medicine

## 2014-12-20 ENCOUNTER — Encounter (HOSPITAL_COMMUNITY): Payer: Self-pay

## 2014-12-20 ENCOUNTER — Ambulatory Visit (INDEPENDENT_AMBULATORY_CARE_PROVIDER_SITE_OTHER): Payer: 59

## 2014-12-20 VITALS — BP 102/80 | HR 94 | Temp 97.5°F | Resp 18 | Ht 68.25 in | Wt 131.4 lb

## 2014-12-20 DIAGNOSIS — Z823 Family history of stroke: Secondary | ICD-10-CM

## 2014-12-20 DIAGNOSIS — R1084 Generalized abdominal pain: Secondary | ICD-10-CM

## 2014-12-20 DIAGNOSIS — R29898 Other symptoms and signs involving the musculoskeletal system: Secondary | ICD-10-CM | POA: Insufficient documentation

## 2014-12-20 DIAGNOSIS — R110 Nausea: Secondary | ICD-10-CM

## 2014-12-20 DIAGNOSIS — F411 Generalized anxiety disorder: Secondary | ICD-10-CM

## 2014-12-20 DIAGNOSIS — R5383 Other fatigue: Secondary | ICD-10-CM

## 2014-12-20 DIAGNOSIS — R202 Paresthesia of skin: Secondary | ICD-10-CM

## 2014-12-20 DIAGNOSIS — Z720 Tobacco use: Secondary | ICD-10-CM | POA: Diagnosis not present

## 2014-12-20 DIAGNOSIS — R7989 Other specified abnormal findings of blood chemistry: Secondary | ICD-10-CM | POA: Diagnosis not present

## 2014-12-20 DIAGNOSIS — R42 Dizziness and giddiness: Secondary | ICD-10-CM

## 2014-12-20 LAB — POCT URINALYSIS DIPSTICK
BILIRUBIN UA: NEGATIVE
Glucose, UA: NEGATIVE
Ketones, UA: NEGATIVE
Leukocytes, UA: NEGATIVE
Nitrite, UA: NEGATIVE
Protein, UA: NEGATIVE
Spec Grav, UA: 1.015
Urobilinogen, UA: 0.2
pH, UA: 5.5

## 2014-12-20 LAB — POCT UA - MICROSCOPIC ONLY
BACTERIA, U MICROSCOPIC: NEGATIVE
CASTS, UR, LPF, POC: NEGATIVE
Crystals, Ur, HPF, POC: NEGATIVE
MUCUS UA: NEGATIVE
Yeast, UA: NEGATIVE

## 2014-12-20 LAB — COMPREHENSIVE METABOLIC PANEL
ALT: 9 U/L (ref 0–35)
AST: 13 U/L (ref 0–37)
Albumin: 4.1 g/dL (ref 3.5–5.2)
Alkaline Phosphatase: 102 U/L (ref 39–117)
BUN: 11 mg/dL (ref 6–23)
CALCIUM: 9.8 mg/dL (ref 8.4–10.5)
CHLORIDE: 106 meq/L (ref 96–112)
CO2: 26 mEq/L (ref 19–32)
CREATININE: 1.03 mg/dL (ref 0.50–1.10)
Glucose, Bld: 85 mg/dL (ref 70–99)
Potassium: 5 mEq/L (ref 3.5–5.3)
Sodium: 141 mEq/L (ref 135–145)
Total Bilirubin: 0.5 mg/dL (ref 0.2–1.2)
Total Protein: 7 g/dL (ref 6.0–8.3)

## 2014-12-20 LAB — POCT CBC
GRANULOCYTE PERCENT: 75.5 % (ref 37–80)
HCT, POC: 43.9 % (ref 37.7–47.9)
Hemoglobin: 13.8 g/dL (ref 12.2–16.2)
Lymph, poc: 1.4 (ref 0.6–3.4)
MCH, POC: 28.7 pg (ref 27–31.2)
MCHC: 31.5 g/dL — AB (ref 31.8–35.4)
MCV: 91.2 fL (ref 80–97)
MID (cbc): 0.3 (ref 0–0.9)
MPV: 8.4 fL (ref 0–99.8)
POC Granulocyte: 6 (ref 2–6.9)
POC LYMPH PERCENT: 18.2 %L (ref 10–50)
POC MID %: 3.3 % (ref 0–12)
Platelet Count, POC: 240 10*3/uL (ref 142–424)
RBC: 4.81 M/uL (ref 4.04–5.48)
RDW, POC: 13.6 %
WBC: 7.6 10*3/uL (ref 4.6–10.2)

## 2014-12-20 LAB — GLUCOSE, POCT (MANUAL RESULT ENTRY): POC Glucose: 97 mg/dl (ref 70–99)

## 2014-12-20 NOTE — Progress Notes (Addendum)
Subjective:  This chart was scribed for Reginia Forts, MD by Randa Evens, ED Scribe. This Patient was seen in room 11 and the patients care was started at 12:13 PM   Patient ID: [name], female    DOB: 09-26-1966, 49 y.o.   MRN: [id number]  12/20/2014  Fatigue; Nausea; and Abdominal Pain   HPI HPI Comments: [name] is a 49 y.o. female with PMHx of breast cancer and PSHx abdominal hysterectomy, bilateral mastectomy and cornea transplants who presents to the [facility] complaining of intermittent left sided abdominal pain onset 3 days ago after eating arbys. Pt state she has associated nausea, flatus and dizziness. Pt states she feels intermittently fatigue as well. Pt states that her "left side feels funny" and then it resolves and she has plenty of energy. Pt states that her face feels as if it is swollen. Pt states that she feels the same tightness in her left leg. Pt states she also feels as if her left leg is heavy. Pt states that her dizziness is worse when standing. Pt states that her entire body feels like it is tingling but states she feels nervous. Pt states that she has had some hear palpitations as well. Pt states she does have stressors in her life. Pt states that the most stressful thing in her life is worrying about her sister who had a stroke about 3 years prior. Pt denies fever, chills, sweats, vomiting,  visual changes, difficulty speaking, trouble swallowing, face numbness, weakness, CP, SOB, diarrhea, constipation, dysuria or urinary frequency.  Denies headache.    Mother passed of aneurysm at age of 67. Father was murdered at age of 3. 3 sisters: 70 yr old sister Hx fibroids; 4 yr old Sister Hx of stroke; youngest sister Hx cholesterol.   Pt is single and lives with her sister. Works for [employer] in [employer]. In school for environmental science.  Currently chewing gum to try and quit smoking. Pt states she has been an everyday  smoker for the past 17 years. Denies any recent alcohol use. Denies drug use. Exercises daily by walking 2 miles.     Review of Systems  Constitutional: Positive for fatigue. Negative for fever, chills, diaphoresis, activity change and appetite change.  HENT: Negative for congestion, postnasal drip, rhinorrhea, sore throat and trouble swallowing.   Eyes: Negative for visual disturbance.  Respiratory: Negative for cough, shortness of breath, wheezing and stridor.   Cardiovascular: Positive for palpitations. Negative for chest pain.  Gastrointestinal: Positive for nausea. Negative for diarrhea and constipation.  Endocrine: Negative for cold intolerance, heat intolerance, polydipsia, polyphagia and polyuria.  Genitourinary: Negative for dysuria, urgency, frequency and hematuria.  Musculoskeletal: Positive for myalgias. Negative for back pain, arthralgias, neck pain and neck stiffness.  Skin: Negative for color change, pallor, rash and wound.  Neurological: Positive for dizziness, weakness and light-headedness. Negative for tremors, seizures, syncope, facial asymmetry, speech difficulty, numbness and headaches.  Psychiatric/Behavioral: Negative for dysphoric mood. The patient is nervous/anxious.     Past Medical History  Diagnosis Date  . Breast cancer 1999    2004  . Hyperlipidemia    Past Surgical History  Procedure Laterality Date  . Abdominal hysterectomy  2006    TAH,BSO secondary bleeding and breast cancer--  . Mastectomy      bilateral---1999/2004   Allergies  Allergen Reactions  . Penicillins Other (See Comments)    Childhood reaction   Current Outpatient Prescriptions  Medication Sig  Dispense Refill  . aspirin 81 MG tablet Take 1 tablet (81 mg total) by mouth daily. 30 tablet   . atorvastatin (LIPITOR) 10 MG tablet Table 1 tablet by mouth daily at 6pm.  PLEASE SCHEDULE OFFICE VISIT ASAP. 30 tablet 0  . nicotine polacrilex (NICORETTE) 2 MG gum Take 1 each (2 mg total) by  mouth as needed for smoking cessation. 100 tablet 0  . ALPRAZolam (XANAX) 0.25 MG tablet Take 1 tablet (0.25 mg total) by mouth 2 (two) times daily as needed for anxiety. (Patient not taking: Reported on 12/20/2014) 30 tablet 0   No current facility-administered medications for this visit.       Objective:    BP 102/80 mmHg  Pulse 94  Temp(Src) 97.5 F (36.4 C) (Oral)  Resp 18  Ht 5' 8.25" (1.734 m)  Wt 131 lb 6.4 oz (59.603 kg)  BMI 19.82 kg/m2  SpO2 97%   Physical Exam  Constitutional: She is oriented to person, place, and time. She appears well-developed and well-nourished. No distress.  HENT:  Head: Normocephalic and atraumatic.  Right Ear: External ear normal.  Left Ear: External ear normal.  Nose: Nose normal.  Mouth/Throat: Oropharynx is clear and moist.  Eyes: Conjunctivae and EOM are normal. Pupils are equal, round, and reactive to light.  Neck: Normal range of motion. Neck supple. No tracheal deviation present. No thyromegaly present.  Cardiovascular: Normal rate, regular rhythm, normal heart sounds and intact distal pulses.  Exam reveals no gallop and no friction rub.   No murmur heard. Pulmonary/Chest: Effort normal and breath sounds normal. No respiratory distress. She has no wheezes. She has no rales.  Abdominal: Soft. Bowel sounds are normal. She exhibits no distension and no mass. There is tenderness in the epigastric area, left upper quadrant and left lower quadrant. There is no rebound and no guarding.  Musculoskeletal: Normal range of motion.       Lumbar back: Normal. She exhibits normal range of motion, no tenderness, no bony tenderness, no pain and no spasm.  Lumbar spine:  Non-tender midline; non-tender paraspinal regions B.  Straight leg raises negative B; toe and heel walking intact; marching intact; motor 5/5 BLE.  Full ROM lumbar spine without limitation. Cervical spine: non-tender midline; non-tender paraspinal regions B; full ROM cervical spine without  limitation.  Motor 5/5 BUE.  Grip 5/5.   Lymphadenopathy:    She has no cervical adenopathy.  Full ROM of lumbar spine.   Neurological: She is alert and oriented to person, place, and time. She has normal strength and normal reflexes. No cranial nerve deficit or sensory deficit. She exhibits normal muscle tone. She displays a negative Romberg sign. Coordination normal.  Sensation intact.   Skin: Skin is warm and dry. No rash noted. She is not diaphoretic.  Psychiatric: She has a normal mood and affect. Her behavior is normal. Judgment and thought content normal.  Nursing note and vitals reviewed.    Results for orders placed or performed in visit on 12/20/14  POCT CBC  Result Value Ref Range   WBC 7.6 4.6 - 10.2 K/uL   Lymph, poc 1.4 0.6 - 3.4   POC LYMPH PERCENT 18.2 10 - 50 %L   MID (cbc) 0.3 0 - 0.9   POC MID % 3.3 0 - 12 %M   POC Granulocyte 6.0 2 - 6.9   Granulocyte percent 75.5 37 - 80 %G   RBC 4.81 4.04 - 5.48 M/uL   Hemoglobin 13.8 12.2 - 16.2  g/dL   HCT, POC 43.9 37.7 - 47.9 %   MCV 91.2 80 - 97 fL   MCH, POC 28.7 27 - 31.2 pg   MCHC 31.5 (A) 31.8 - 35.4 g/dL   RDW, POC 13.6 %   Platelet Count, POC 240 142 - 424 K/uL   MPV 8.4 0 - 99.8 fL  POCT glucose (manual entry)  Result Value Ref Range   POC Glucose 97 70 - 99 mg/dl  POCT urinalysis dipstick  Result Value Ref Range   Color, UA yellow    Clarity, UA clear    Glucose, UA neg    Bilirubin, UA neg    Ketones, UA neg    Spec Grav, UA 1.015    Blood, UA trace-itnact    pH, UA 5.5    Protein, UA neg    Urobilinogen, UA 0.2    Nitrite, UA neg    Leukocytes, UA Negative   POCT UA - Microscopic Only  Result Value Ref Range   WBC, Ur, HPF, POC 0-1    RBC, urine, microscopic 1-2    Bacteria, U Microscopic neg    Mucus, UA neg    Epithelial cells, urine per micros 1-4    Crystals, Ur, HPF, POC neg    Casts, Ur, LPF, POC neg    Yeast, UA neg    [facility] reading (PRIMARY) by  Dr. Tamala Julian.  KUB:  +stool burden in  rectal vault; NAD      Assessment & Plan:   1. Generalized abdominal pain   2. Nausea without vomiting   3. Leg heaviness   4. Anxiety state   5. Other fatigue   6. Tobacco abuse   7. Abnormal TSH   8. Family history of CVA   58. Paresthesias   10. Dizziness and giddiness     1.  Abdominal pain: New. Benign abdominal exam.  Associated with nausea.  Treat with stool softener daily.  BRAT diet. 2.  Nausea without vomiting:  New.  Benign exam; obtain labs.  BRAT diet, hydration. 3.  Leg heaviness: associated with L sided paresthesias; most consistent with anxiety state however sister with CVA and patient an active smoker with hyperlipidemia.  Obtain CT head to rule out CVA or acute intracranial process. 4. Anxiety state: chronic and uncontrolled; highly recommend counseling and consider medication. 5.  Fatigue and malaise: New. Consistent with acute illness and anxiety.  Recommend rest, fluids. 6.  Tobacco abuse: encourage cessation; increased risk of CVA. 7.  Abnormal TSH: New at PCP office in 07/2014; repeat labs today with current symptoms. 8.  Family history of CVA: in sister on OCPs. 9.  Paresthesias: New.  Associated with L leg heaviness.  Obtain CT head. 10.  Dizziness: New.  Normal neurological exam.  Obtain CT head; associated with paresthesias, leg heaviness.     No orders of the defined types were placed in this encounter.    No Follow-up on file.    I personally performed the services described in this documentation, which was scribed in my presence. The recorded information has been reviewed and considered.  Bennett Vanscyoc Elayne Guerin, M.D. [facility] [address] [place], [place]  [address] [phone] phone [phone] fax

## 2014-12-20 NOTE — Patient Instructions (Signed)
Go to [facility] and register at the Emergency Department for [place]. Do not register as ED patient.

## 2014-12-21 LAB — TSH: TSH: 0.491 u[IU]/mL (ref 0.350–4.500)

## 2015-02-11 ENCOUNTER — Other Ambulatory Visit: Payer: Self-pay | Admitting: Family Medicine

## 2015-02-11 DIAGNOSIS — E785 Hyperlipidemia, unspecified: Secondary | ICD-10-CM

## 2015-02-22 ENCOUNTER — Other Ambulatory Visit (INDEPENDENT_AMBULATORY_CARE_PROVIDER_SITE_OTHER): Payer: 59

## 2015-02-22 ENCOUNTER — Encounter: Payer: Self-pay | Admitting: Family Medicine

## 2015-02-22 DIAGNOSIS — E785 Hyperlipidemia, unspecified: Secondary | ICD-10-CM | POA: Diagnosis not present

## 2015-02-22 LAB — LIPID PANEL
CHOLESTEROL: 144 mg/dL (ref 0–200)
HDL: 34.4 mg/dL — ABNORMAL LOW (ref >39.00)
LDL Cholesterol: 85 mg/dL (ref 0–99)
NonHDL: 109.6
TRIGLYCERIDES: 122 mg/dL (ref 0.0–149.0)
Total CHOL/HDL Ratio: 4
VLDL: 24.4 mg/dL (ref 0.0–40.0)

## 2015-02-22 LAB — HEPATIC FUNCTION PANEL
ALT: 22 U/L (ref 0–35)
AST: 18 U/L (ref 0–37)
Albumin: 3.7 g/dL (ref 3.5–5.2)
Alkaline Phosphatase: 99 U/L (ref 39–117)
BILIRUBIN TOTAL: 0.4 mg/dL (ref 0.2–1.2)
Bilirubin, Direct: 0.1 mg/dL (ref 0.0–0.3)
Total Protein: 6.8 g/dL (ref 6.0–8.3)

## 2015-03-22 ENCOUNTER — Encounter: Payer: Self-pay | Admitting: Family Medicine

## 2015-03-22 ENCOUNTER — Ambulatory Visit (INDEPENDENT_AMBULATORY_CARE_PROVIDER_SITE_OTHER): Payer: 59 | Admitting: Family Medicine

## 2015-03-22 VITALS — BP 116/78 | HR 89 | Temp 97.8°F | Resp 18 | Ht 66.0 in | Wt 134.0 lb

## 2015-03-22 DIAGNOSIS — G4700 Insomnia, unspecified: Secondary | ICD-10-CM

## 2015-03-22 DIAGNOSIS — E785 Hyperlipidemia, unspecified: Secondary | ICD-10-CM

## 2015-03-22 DIAGNOSIS — R002 Palpitations: Secondary | ICD-10-CM | POA: Diagnosis not present

## 2015-03-22 DIAGNOSIS — F411 Generalized anxiety disorder: Secondary | ICD-10-CM

## 2015-03-22 DIAGNOSIS — F17210 Nicotine dependence, cigarettes, uncomplicated: Secondary | ICD-10-CM | POA: Diagnosis not present

## 2015-03-22 DIAGNOSIS — Z0000 Encounter for general adult medical examination without abnormal findings: Secondary | ICD-10-CM

## 2015-03-22 MED ORDER — ESCITALOPRAM OXALATE 10 MG PO TABS: 10.0000 mg | ORAL_TABLET | Freq: Every day | ORAL | Status: DC

## 2015-03-22 MED ORDER — VARENICLINE TARTRATE 0.5 MG X 11 & 1 MG X 42 PO MISC: ORAL | Status: DC

## 2015-03-22 MED ORDER — ALPRAZOLAM 0.25 MG PO TABS: 0.2500 mg | ORAL_TABLET | Freq: Two times a day (BID) | ORAL | Status: DC | PRN

## 2015-03-22 NOTE — Patient Instructions (Signed)
Preventive Care for Adults A healthy lifestyle and preventive care can promote health and wellness. Preventive health guidelines for women include the following key practices.  A routine yearly physical is a good way to check with your health care provider about your health and preventive screening. It is a chance to share any concerns and updates on your health and to receive a thorough exam.  Visit your dentist for a routine exam and preventive care every 6 months. Brush your teeth twice a day and floss once a day. Good oral hygiene prevents tooth decay and gum disease.  The frequency of eye exams is based on your age, health, family medical history, use of contact lenses, and other factors. Follow your health care provider's recommendations for frequency of eye exams.  Eat a healthy diet. Foods like vegetables, fruits, whole grains, low-fat dairy products, and lean protein foods contain the nutrients you need without too many calories. Decrease your intake of foods high in solid fats, added sugars, and salt. Eat the right amount of calories for you.Get information about a proper diet from your health care provider, if necessary.  Regular physical exercise is one of the most important things you can do for your health. Most adults should get at least 150 minutes of moderate-intensity exercise (any activity that increases your heart rate and causes you to sweat) each week. In addition, most adults need muscle-strengthening exercises on 2 or more days a week.  Maintain a healthy weight. The body mass index (BMI) is a screening tool to identify possible weight problems. It provides an estimate of body fat based on height and weight. Your health care provider can find your BMI and can help you achieve or maintain a healthy weight.For adults 20 years and older:  A BMI below 18.5 is considered underweight.  A BMI of 18.5 to 24.9 is normal.  A BMI of 25 to 29.9 is considered overweight.  A BMI of  30 and above is considered obese.  Maintain normal blood lipids and cholesterol levels by exercising and minimizing your intake of saturated fat. Eat a balanced diet with plenty of fruit and vegetables. Blood tests for lipids and cholesterol should begin at age 76 and be repeated every 5 years. If your lipid or cholesterol levels are high, you are over 50, or you are at high risk for heart disease, you may need your cholesterol levels checked more frequently.Ongoing high lipid and cholesterol levels should be treated with medicines if diet and exercise are not working.  If you smoke, find out from your health care provider how to quit. If you do not use tobacco, do not start.  Lung cancer screening is recommended for adults aged 22-80 years who are at high risk for developing lung cancer because of a history of smoking. A yearly low-dose CT scan of the lungs is recommended for people who have at least a 30-pack-year history of smoking and are a current smoker or have quit within the past 15 years. A pack year of smoking is smoking an average of 1 pack of cigarettes a day for 1 year (for example: 1 pack a day for 30 years or 2 packs a day for 15 years). Yearly screening should continue until the smoker has stopped smoking for at least 15 years. Yearly screening should be stopped for people who develop a health problem that would prevent them from having lung cancer treatment.  If you are pregnant, do not drink alcohol. If you are breastfeeding,  be very cautious about drinking alcohol. If you are not pregnant and choose to drink alcohol, do not have more than 1 drink per day. One drink is considered to be 12 ounces (355 mL) of beer, 5 ounces (148 mL) of wine, or 1.5 ounces (44 mL) of liquor.  Avoid use of street drugs. Do not share needles with anyone. Ask for help if you need support or instructions about stopping the use of drugs.  High blood pressure causes heart disease and increases the risk of  stroke. Your blood pressure should be checked at least every 1 to 2 years. Ongoing high blood pressure should be treated with medicines if weight loss and exercise do not work.  If you are 3-86 years old, ask your health care provider if you should take aspirin to prevent strokes.  Diabetes screening involves taking a blood sample to check your fasting blood sugar level. This should be done once every 3 years, after age 67, if you are within normal weight and without risk factors for diabetes. Testing should be considered at a younger age or be carried out more frequently if you are overweight and have at least 1 risk factor for diabetes.  Breast cancer screening is essential preventive care for women. You should practice "breast self-awareness." This means understanding the normal appearance and feel of your breasts and may include breast self-examination. Any changes detected, no matter how small, should be reported to a health care provider. Women in their 8s and 30s should have a clinical breast exam (CBE) by a health care provider as part of a regular health exam every 1 to 3 years. After age 70, women should have a CBE every year. Starting at age 25, women should consider having a mammogram (breast X-ray test) every year. Women who have a family history of breast cancer should talk to their health care provider about genetic screening. Women at a high risk of breast cancer should talk to their health care providers about having an MRI and a mammogram every year.  Breast cancer gene (BRCA)-related cancer risk assessment is recommended for women who have family members with BRCA-related cancers. BRCA-related cancers include breast, ovarian, tubal, and peritoneal cancers. Having family members with these cancers may be associated with an increased risk for harmful changes (mutations) in the breast cancer genes BRCA1 and BRCA2. Results of the assessment will determine the need for genetic counseling and  BRCA1 and BRCA2 testing.  Routine pelvic exams to screen for cancer are no longer recommended for nonpregnant women who are considered low risk for cancer of the pelvic organs (ovaries, uterus, and vagina) and who do not have symptoms. Ask your health care provider if a screening pelvic exam is right for you.  If you have had past treatment for cervical cancer or a condition that could lead to cancer, you need Pap tests and screening for cancer for at least 20 years after your treatment. If Pap tests have been discontinued, your risk factors (such as having a new sexual partner) need to be reassessed to determine if screening should be resumed. Some women have medical problems that increase the chance of getting cervical cancer. In these cases, your health care provider may recommend more frequent screening and Pap tests.  The HPV test is an additional test that may be used for cervical cancer screening. The HPV test looks for the virus that can cause the cell changes on the cervix. The cells collected during the Pap test can be  tested for HPV. The HPV test could be used to screen women aged 30 years and older, and should be used in women of any age who have unclear Pap test results. After the age of 30, women should have HPV testing at the same frequency as a Pap test.  Colorectal cancer can be detected and often prevented. Most routine colorectal cancer screening begins at the age of 50 years and continues through age 75 years. However, your health care provider may recommend screening at an earlier age if you have risk factors for colon cancer. On a yearly basis, your health care provider may provide home test kits to check for hidden blood in the stool. Use of a small camera at the end of a tube, to directly examine the colon (sigmoidoscopy or colonoscopy), can detect the earliest forms of colorectal cancer. Talk to your health care provider about this at age 50, when routine screening begins. Direct  exam of the colon should be repeated every 5-10 years through age 75 years, unless early forms of pre-cancerous polyps or small growths are found.  People who are at an increased risk for hepatitis B should be screened for this virus. You are considered at high risk for hepatitis B if:  You were born in a country where hepatitis B occurs often. Talk with your health care provider about which countries are considered high risk.  Your parents were born in a high-risk country and you have not received a shot to protect against hepatitis B (hepatitis B vaccine).  You have HIV or AIDS.  You use needles to inject street drugs.  You live with, or have sex with, someone who has hepatitis B.  You get hemodialysis treatment.  You take certain medicines for conditions like cancer, organ transplantation, and autoimmune conditions.  Hepatitis C blood testing is recommended for all people born from 1945 through 1965 and any individual with known risks for hepatitis C.  Practice safe sex. Use condoms and avoid high-risk sexual practices to reduce the spread of sexually transmitted infections (STIs). STIs include gonorrhea, chlamydia, syphilis, trichomonas, herpes, HPV, and human immunodeficiency virus (HIV). Herpes, HIV, and HPV are viral illnesses that have no cure. They can result in disability, cancer, and death.  You should be screened for sexually transmitted illnesses (STIs) including gonorrhea and chlamydia if:  You are sexually active and are younger than 24 years.  You are older than 24 years and your health care provider tells you that you are at risk for this type of infection.  Your sexual activity has changed since you were last screened and you are at an increased risk for chlamydia or gonorrhea. Ask your health care provider if you are at risk.  If you are at risk of being infected with HIV, it is recommended that you take a prescription medicine daily to prevent HIV infection. This is  called preexposure prophylaxis (PrEP). You are considered at risk if:  You are a heterosexual woman, are sexually active, and are at increased risk for HIV infection.  You take drugs by injection.  You are sexually active with a partner who has HIV.  Talk with your health care provider about whether you are at high risk of being infected with HIV. If you choose to begin PrEP, you should first be tested for HIV. You should then be tested every 3 months for as long as you are taking PrEP.  Osteoporosis is a disease in which the bones lose minerals and strength   with aging. This can result in serious bone fractures or breaks. The risk of osteoporosis can be identified using a bone density scan. Women ages 65 years and over and women at risk for fractures or osteoporosis should discuss screening with their health care providers. Ask your health care provider whether you should take a calcium supplement or vitamin D to reduce the rate of osteoporosis.  Menopause can be associated with physical symptoms and risks. Hormone replacement therapy is available to decrease symptoms and risks. You should talk to your health care provider about whether hormone replacement therapy is right for you.  Use sunscreen. Apply sunscreen liberally and repeatedly throughout the day. You should seek shade when your shadow is shorter than you. Protect yourself by wearing long sleeves, pants, a wide-brimmed hat, and sunglasses year round, whenever you are outdoors.  Once a month, do a whole body skin exam, using a mirror to look at the skin on your back. Tell your health care provider of new moles, moles that have irregular borders, moles that are larger than a pencil eraser, or moles that have changed in shape or color.  Stay current with required vaccines (immunizations).  Influenza vaccine. All adults should be immunized every year.  Tetanus, diphtheria, and acellular pertussis (Td, Tdap) vaccine. Pregnant women should  receive 1 dose of Tdap vaccine during each pregnancy. The dose should be obtained regardless of the length of time since the last dose. Immunization is preferred during the 27th-36th week of gestation. An adult who has not previously received Tdap or who does not know her vaccine status should receive 1 dose of Tdap. This initial dose should be followed by tetanus and diphtheria toxoids (Td) booster doses every 10 years. Adults with an unknown or incomplete history of completing a 3-dose immunization series with Td-containing vaccines should begin or complete a primary immunization series including a Tdap dose. Adults should receive a Td booster every 10 years.  Varicella vaccine. An adult without evidence of immunity to varicella should receive 2 doses or a second dose if she has previously received 1 dose. Pregnant females who do not have evidence of immunity should receive the first dose after pregnancy. This first dose should be obtained before leaving the health care facility. The second dose should be obtained 4-8 weeks after the first dose.  Human papillomavirus (HPV) vaccine. Females aged 13-26 years who have not received the vaccine previously should obtain the 3-dose series. The vaccine is not recommended for use in pregnant females. However, pregnancy testing is not needed before receiving a dose. If a female is found to be pregnant after receiving a dose, no treatment is needed. In that case, the remaining doses should be delayed until after the pregnancy. Immunization is recommended for any person with an immunocompromised condition through the age of 26 years if she did not get any or all doses earlier. During the 3-dose series, the second dose should be obtained 4-8 weeks after the first dose. The third dose should be obtained 24 weeks after the first dose and 16 weeks after the second dose.  Zoster vaccine. One dose is recommended for adults aged 60 years or older unless certain conditions are  present.  Measles, mumps, and rubella (MMR) vaccine. Adults born before 1957 generally are considered immune to measles and mumps. Adults born in 1957 or later should have 1 or more doses of MMR vaccine unless there is a contraindication to the vaccine or there is laboratory evidence of immunity to   each of the three diseases. A routine second dose of MMR vaccine should be obtained at least 28 days after the first dose for students attending postsecondary schools, health care workers, or international travelers. People who received inactivated measles vaccine or an unknown type of measles vaccine during 1963-1967 should receive 2 doses of MMR vaccine. People who received inactivated mumps vaccine or an unknown type of mumps vaccine before 1979 and are at high risk for mumps infection should consider immunization with 2 doses of MMR vaccine. For females of childbearing age, rubella immunity should be determined. If there is no evidence of immunity, females who are not pregnant should be vaccinated. If there is no evidence of immunity, females who are pregnant should delay immunization until after pregnancy. Unvaccinated health care workers born before 1957 who lack laboratory evidence of measles, mumps, or rubella immunity or laboratory confirmation of disease should consider measles and mumps immunization with 2 doses of MMR vaccine or rubella immunization with 1 dose of MMR vaccine.  Pneumococcal 13-valent conjugate (PCV13) vaccine. When indicated, a person who is uncertain of her immunization history and has no record of immunization should receive the PCV13 vaccine. An adult aged 19 years or older who has certain medical conditions and has not been previously immunized should receive 1 dose of PCV13 vaccine. This PCV13 should be followed with a dose of pneumococcal polysaccharide (PPSV23) vaccine. The PPSV23 vaccine dose should be obtained at least 8 weeks after the dose of PCV13 vaccine. An adult aged 19  years or older who has certain medical conditions and previously received 1 or more doses of PPSV23 vaccine should receive 1 dose of PCV13. The PCV13 vaccine dose should be obtained 1 or more years after the last PPSV23 vaccine dose.  Pneumococcal polysaccharide (PPSV23) vaccine. When PCV13 is also indicated, PCV13 should be obtained first. All adults aged 65 years and older should be immunized. An adult younger than age 65 years who has certain medical conditions should be immunized. Any person who resides in a nursing home or long-term care facility should be immunized. An adult smoker should be immunized. People with an immunocompromised condition and certain other conditions should receive both PCV13 and PPSV23 vaccines. People with human immunodeficiency virus (HIV) infection should be immunized as soon as possible after diagnosis. Immunization during chemotherapy or radiation therapy should be avoided. Routine use of PPSV23 vaccine is not recommended for American Indians, [place] Natives, or people younger than 65 years unless there are medical conditions that require PPSV23 vaccine. When indicated, people who have unknown immunization and have no record of immunization should receive PPSV23 vaccine. One-time revaccination 5 years after the first dose of PPSV23 is recommended for people aged 19-64 years who have chronic kidney failure, nephrotic syndrome, asplenia, or immunocompromised conditions. People who received 1-2 doses of PPSV23 before age 65 years should receive another dose of PPSV23 vaccine at age 65 years or later if at least 5 years have passed since the previous dose. Doses of PPSV23 are not needed for people immunized with PPSV23 at or after age 65 years.  Meningococcal vaccine. Adults with asplenia or persistent complement component deficiencies should receive 2 doses of quadrivalent meningococcal conjugate (MenACWY-D) vaccine. The doses should be obtained at least 2 months apart.  Microbiologists working with certain meningococcal bacteria, military recruits, people at risk during an outbreak, and people who travel to or live in countries with a high rate of meningitis should be immunized. A first-year college student up through age   21 years who is living in a residence hall should receive a dose if she did not receive a dose on or after her 16th birthday. Adults who have certain high-risk conditions should receive one or more doses of vaccine.  Hepatitis A vaccine. Adults who wish to be protected from this disease, have certain high-risk conditions, work with hepatitis A-infected animals, work in hepatitis A research labs, or travel to or work in countries with a high rate of hepatitis A should be immunized. Adults who were previously unvaccinated and who anticipate close contact with an international adoptee during the first 60 days after arrival in the [place] from a country with a high rate of hepatitis A should be immunized.  Hepatitis B vaccine. Adults who wish to be protected from this disease, have certain high-risk conditions, may be exposed to blood or other infectious body fluids, are household contacts or sex partners of hepatitis B positive people, are clients or workers in certain care facilities, or travel to or work in countries with a high rate of hepatitis B should be immunized.  Haemophilus influenzae type b (Hib) vaccine. A previously unvaccinated person with asplenia or sickle cell disease or having a scheduled splenectomy should receive 1 dose of Hib vaccine. Regardless of previous immunization, a recipient of a hematopoietic stem cell transplant should receive a 3-dose series 6-12 months after her successful transplant. Hib vaccine is not recommended for adults with HIV infection. Preventive Services / Frequency Ages 64 to 68 years  Blood pressure check.** / Every 1 to 2 years.  Lipid and cholesterol check.** / Every 5 years beginning at age  22.  Clinical breast exam.** / Every 3 years for women in their 88s and 53s.  BRCA-related cancer risk assessment.** / For women who have family members with a BRCA-related cancer (breast, ovarian, tubal, or peritoneal cancers).  Pap test.** / Every 2 years from ages 90 through 51. Every 3 years starting at age 21 through age 56 or 3 with a history of 3 consecutive normal Pap tests.  HPV screening.** / Every 3 years from ages 24 through ages 1 to 46 with a history of 3 consecutive normal Pap tests.  Hepatitis C blood test.** / For any individual with known risks for hepatitis C.  Skin self-exam. / Monthly.  Influenza vaccine. / Every year.  Tetanus, diphtheria, and acellular pertussis (Tdap, Td) vaccine.** / Consult your health care provider. Pregnant women should receive 1 dose of Tdap vaccine during each pregnancy. 1 dose of Td every 10 years.  Varicella vaccine.** / Consult your health care provider. Pregnant females who do not have evidence of immunity should receive the first dose after pregnancy.  HPV vaccine. / 3 doses over 6 months, if 72 and younger. The vaccine is not recommended for use in pregnant females. However, pregnancy testing is not needed before receiving a dose.  Measles, mumps, rubella (MMR) vaccine.** / You need at least 1 dose of MMR if you were born in 1957 or later. You may also need a 2nd dose. For females of childbearing age, rubella immunity should be determined. If there is no evidence of immunity, females who are not pregnant should be vaccinated. If there is no evidence of immunity, females who are pregnant should delay immunization until after pregnancy.  Pneumococcal 13-valent conjugate (PCV13) vaccine.** / Consult your health care provider.  Pneumococcal polysaccharide (PPSV23) vaccine.** / 1 to 2 doses if you smoke cigarettes or if you have certain conditions.  Meningococcal vaccine.** /  1 dose if you are age 19 to 21 years and a first-year college  student living in a residence hall, or have one of several medical conditions, you need to get vaccinated against meningococcal disease. You may also need additional booster doses.  Hepatitis A vaccine.** / Consult your health care provider.  Hepatitis B vaccine.** / Consult your health care provider.  Haemophilus influenzae type b (Hib) vaccine.** / Consult your health care provider. Ages 40 to 64 years  Blood pressure check.** / Every 1 to 2 years.  Lipid and cholesterol check.** / Every 5 years beginning at age 20 years.  Lung cancer screening. / Every year if you are aged 55-80 years and have a 30-pack-year history of smoking and currently smoke or have quit within the past 15 years. Yearly screening is stopped once you have quit smoking for at least 15 years or develop a health problem that would prevent you from having lung cancer treatment.  Clinical breast exam.** / Every year after age 40 years.  BRCA-related cancer risk assessment.** / For women who have family members with a BRCA-related cancer (breast, ovarian, tubal, or peritoneal cancers).  Mammogram.** / Every year beginning at age 40 years and continuing for as long as you are in good health. Consult with your health care provider.  Pap test.** / Every 3 years starting at age 30 years through age 65 or 70 years with a history of 3 consecutive normal Pap tests.  HPV screening.** / Every 3 years from ages 30 years through ages 65 to 70 years with a history of 3 consecutive normal Pap tests.  Fecal occult blood test (FOBT) of stool. / Every year beginning at age 50 years and continuing until age 75 years. You may not need to do this test if you get a colonoscopy every 10 years.  Flexible sigmoidoscopy or colonoscopy.** / Every 5 years for a flexible sigmoidoscopy or every 10 years for a colonoscopy beginning at age 50 years and continuing until age 75 years.  Hepatitis C blood test.** / For all people born from 1945 through  1965 and any individual with known risks for hepatitis C.  Skin self-exam. / Monthly.  Influenza vaccine. / Every year.  Tetanus, diphtheria, and acellular pertussis (Tdap/Td) vaccine.** / Consult your health care provider. Pregnant women should receive 1 dose of Tdap vaccine during each pregnancy. 1 dose of Td every 10 years.  Varicella vaccine.** / Consult your health care provider. Pregnant females who do not have evidence of immunity should receive the first dose after pregnancy.  Zoster vaccine.** / 1 dose for adults aged 60 years or older.  Measles, mumps, rubella (MMR) vaccine.** / You need at least 1 dose of MMR if you were born in 1957 or later. You may also need a 2nd dose. For females of childbearing age, rubella immunity should be determined. If there is no evidence of immunity, females who are not pregnant should be vaccinated. If there is no evidence of immunity, females who are pregnant should delay immunization until after pregnancy.  Pneumococcal 13-valent conjugate (PCV13) vaccine.** / Consult your health care provider.  Pneumococcal polysaccharide (PPSV23) vaccine.** / 1 to 2 doses if you smoke cigarettes or if you have certain conditions.  Meningococcal vaccine.** / Consult your health care provider.  Hepatitis A vaccine.** / Consult your health care provider.  Hepatitis B vaccine.** / Consult your health care provider.  Haemophilus influenzae type b (Hib) vaccine.** / Consult your health care provider. Ages 65   years and over  Blood pressure check.** / Every 1 to 2 years.  Lipid and cholesterol check.** / Every 5 years beginning at age 22 years.  Lung cancer screening. / Every year if you are aged 73-80 years and have a 30-pack-year history of smoking and currently smoke or have quit within the past 15 years. Yearly screening is stopped once you have quit smoking for at least 15 years or develop a health problem that would prevent you from having lung cancer  treatment.  Clinical breast exam.** / Every year after age 4 years.  BRCA-related cancer risk assessment.** / For women who have family members with a BRCA-related cancer (breast, ovarian, tubal, or peritoneal cancers).  Mammogram.** / Every year beginning at age 40 years and continuing for as long as you are in good health. Consult with your health care provider.  Pap test.** / Every 3 years starting at age 9 years through age 34 or 91 years with 3 consecutive normal Pap tests. Testing can be stopped between 65 and 70 years with 3 consecutive normal Pap tests and no abnormal Pap or HPV tests in the past 10 years.  HPV screening.** / Every 3 years from ages 57 years through ages 64 or 45 years with a history of 3 consecutive normal Pap tests. Testing can be stopped between 65 and 70 years with 3 consecutive normal Pap tests and no abnormal Pap or HPV tests in the past 10 years.  Fecal occult blood test (FOBT) of stool. / Every year beginning at age 15 years and continuing until age 17 years. You may not need to do this test if you get a colonoscopy every 10 years.  Flexible sigmoidoscopy or colonoscopy.** / Every 5 years for a flexible sigmoidoscopy or every 10 years for a colonoscopy beginning at age 86 years and continuing until age 71 years.  Hepatitis C blood test.** / For all people born from [birth year] through 1965 and any individual with known risks for hepatitis C.  Osteoporosis screening.** / A one-time screening for women ages 83 years and over and women at risk for fractures or osteoporosis.  Skin self-exam. / Monthly.  Influenza vaccine. / Every year.  Tetanus, diphtheria, and acellular pertussis (Tdap/Td) vaccine.** / 1 dose of Td every 10 years.  Varicella vaccine.** / Consult your health care provider.  Zoster vaccine.** / 1 dose for adults aged 61 years or older.  Pneumococcal 13-valent conjugate (PCV13) vaccine.** / Consult your health care provider.  Pneumococcal  polysaccharide (PPSV23) vaccine.** / 1 dose for all adults aged 28 years and older.  Meningococcal vaccine.** / Consult your health care provider.  Hepatitis A vaccine.** / Consult your health care provider.  Hepatitis B vaccine.** / Consult your health care provider.  Haemophilus influenzae type b (Hib) vaccine.** / Consult your health care provider. ** Family history and personal history of risk and conditions may change your health care provider's recommendations. Document Released: 11/21/2001 Document Revised: 02/09/2014 Document Reviewed: 02/20/2011 [facility] Patient Information 2015 [place], [place]. This information is not intended to replace advice given to you by your health care provider. Make sure you discuss any questions you have with your health care provider.

## 2015-03-22 NOTE — Progress Notes (Signed)
Subjective:     [name] is a 49 y.o. female and is here for a comprehensive physical exam. The patient reports problems - she wants chantix for smoking.    she is also having more palpitations and is requesting a cardiology referral.  History   Social History  . Marital Status: Single    Spouse Name: N/A  . Number of Children: N/A  . Years of Education: N/A   Occupational History  . Not on file.   Social History Main Topics  . Smoking status: Current Every Day Smoker -- 0.50 packs/day for 27 years    Types: Cigarettes  . Smokeless tobacco: Never Used  . Alcohol Use: Yes  . Drug Use: No  . Sexual Activity: Not Currently   Other Topics Concern  . Not on file   Social History Narrative   Marital status: single      Children: one daughter      Lives: with sister      Employment: works for [employer]; office specialist      Education: Multimedia programmer      Tobacco: trying to quit      Alcohol: none; rare      Drugs: none      Exercise: walking daily 2 miles   Health Maintenance  Topic Date Due  . HIV Screening  03/21/2016 (Originally 06/25/1981)  . INFLUENZA VACCINE  05/10/2015  . PAP SMEAR  07/18/2016  . TETANUS/TDAP  10/10/2019    The following portions of the patient's history were reviewed and updated as appropriate:  She  has a past medical history of Breast cancer (1999) and Hyperlipidemia. She  does not have any pertinent problems on file. She  has past surgical history that includes Abdominal hysterectomy (2006) and Mastectomy. Her family history includes Aortic aneurysm in an other family member; Hyperlipidemia in her sister; Hypertension in an other family member; Stroke (age of onset: 75) in her sister. She  reports that she has been smoking Cigarettes.  She has a 13.5 pack-year smoking history. She has never used smokeless tobacco. She reports that she drinks alcohol. She reports that she does not use illicit drugs. She has a  current medication list which includes the following prescription(s): aspirin, atorvastatin, alprazolam, nicotine polacrilex, and varenicline. Current Outpatient Prescriptions on File Prior to Visit  Medication Sig Dispense Refill  . aspirin 81 MG tablet Take 1 tablet (81 mg total) by mouth daily. 30 tablet   . atorvastatin (LIPITOR) 10 MG tablet Take 1 tablet (10 mg total) by mouth daily. Repeat labs are due now 90 tablet 0  . ALPRAZolam (XANAX) 0.25 MG tablet Take 1 tablet (0.25 mg total) by mouth 2 (two) times daily as needed for anxiety. (Patient not taking: Reported on 12/20/2014) 30 tablet 0  . nicotine polacrilex (NICORETTE) 2 MG gum Take 1 each (2 mg total) by mouth as needed for smoking cessation. (Patient not taking: Reported on 03/22/2015) 100 tablet 0   No current facility-administered medications on file prior to visit.   She is allergic to penicillins..  Review of Systems Review of Systems  Constitutional: Negative for activity change, appetite change and fatigue.  HENT: Negative for hearing loss, congestion, tinnitus and ear discharge.  dentist q54m Eyes: Negative for visual disturbance (see optho q1y -- vision corrected to 20/20 with glasses).  Respiratory: Negative for cough, chest tightness and shortness of breath.   Cardiovascular: Negative for chest pain, palpitations and leg swelling.  Gastrointestinal: Negative for abdominal pain, diarrhea, constipation and abdominal distention.  Genitourinary: Negative for urgency, frequency, decreased urine volume and difficulty urinating.  Musculoskeletal: Negative for back pain, arthralgias and gait problem.  Skin: Negative for color change, pallor and rash.  Neurological: Negative for dizziness, light-headedness, numbness and headaches.  Hematological: Negative for adenopathy. Does not bruise/bleed easily.  Psychiatric/Behavioral: Negative for suicidal ideas, confusion, sleep disturbance, self-injury, dysphoric mood, decreased  concentration and agitation.       Objective:    BP 116/78 mmHg  Pulse 89  Temp(Src) 97.8 F (36.6 C) (Oral)  Resp 18  Ht 5\' 6"  (1.676 m)  Wt 134 lb (60.782 kg)  BMI 21.64 kg/m2  SpO2 100% General appearance: alert, cooperative, appears stated age and no distress Head: Normocephalic, without obvious abnormality, atraumatic Eyes: conjunctivae/corneas clear. PERRL, EOM's intact. Fundi benign. Ears: normal TM's and external ear canals both ears Nose: Nares normal. Septum midline. Mucosa normal. No drainage or sinus tenderness. Throat: lips, mucosa, and tongue normal; teeth and gums normal Neck: no adenopathy, no carotid bruit, no JVD, supple, symmetrical, trachea midline and thyroid not enlarged, symmetric, no tenderness/mass/nodules Back: symmetric, no curvature. ROM normal. No CVA tenderness. Lungs: clear to auscultation bilaterally Breasts: b/l mastectomy Heart: regular rate and rhythm, S1, S2 normal, no murmur, click, rub or gallop Abdomen: soft, non-tender; bowel sounds normal; no masses,  no organomegaly Pelvic: deferred Extremities: extremities normal, atraumatic, no cyanosis or edema Pulses: 2+ and symmetric Skin: Skin color, texture, turgor normal. No rashes or lesions Lymph nodes: Cervical, supraclavicular, and axillary nodes normal. Neurologic: Alert and oriented X 3, normal strength and tone. Normal symmetric reflexes. Normal coordination and gait Psych- no depression,  No anxiety      Assessment:    Healthy female exam.      Plan:    ghm utd Check labs See After Visit Summary for Counseling Recommendations    1. Palpitations  - EKG 12-Lead - Ambulatory referral to Cardiology  2. Smoking 1/2 pack a day or less   - varenicline (CHANTIX STARTING MONTH PAK) 0.5 MG X 11 & 1 MG X 42 tablet; Take one 0.5 mg tablet by mouth once daily for 3 days, then increase to one 0.5 mg tablet twice daily for 4 days, then increase to one 1 mg tablet twice daily.  Dispense:  53 tablet; Refill: 0  3. Hyperlipidemia LDL goal <100    4. Generalized anxiety disorder   - CBC with Differential/Platelet - POCT urinalysis dipstick - TSH - Basic metabolic panel - escitalopram (LEXAPRO) 10 MG tablet; Take 1 tablet (10 mg total) by mouth daily.  Dispense: 30 tablet; Refill: 5  5. Preventative health care  See avs - CBC with Differential/Platelet - POCT urinalysis dipstick - TSH - Basic metabolic panel  6. Insomnia   - ALPRAZolam (XANAX) 0.25 MG tablet; Take 1 tablet (0.25 mg total) by mouth 2 (two) times daily as needed for anxiety.  Dispense: 30 tablet; Refill: 0

## 2015-03-22 NOTE — Progress Notes (Signed)
Pre visit review using our clinic review tool, if applicable. No additional management support is needed unless otherwise documented below in the visit note. 

## 2015-03-23 LAB — CBC WITH DIFFERENTIAL/PLATELET
BASOS PCT: 3.7 % — AB (ref 0.0–3.0)
Basophils Absolute: 0.2 10*3/uL — ABNORMAL HIGH (ref 0.0–0.1)
EOS ABS: 0.5 10*3/uL (ref 0.0–0.7)
Eosinophils Relative: 7.7 % — ABNORMAL HIGH (ref 0.0–5.0)
HCT: 41.1 % (ref 36.0–46.0)
Hemoglobin: 13.7 g/dL (ref 12.0–15.0)
LYMPHS ABS: 2.5 10*3/uL (ref 0.7–4.0)
Lymphocytes Relative: 39.7 % (ref 12.0–46.0)
MCHC: 33.5 g/dL (ref 30.0–36.0)
MCV: 90 fl (ref 78.0–100.0)
Monocytes Absolute: 0.4 10*3/uL (ref 0.1–1.0)
Monocytes Relative: 5.6 % (ref 3.0–12.0)
NEUTROS ABS: 2.8 10*3/uL (ref 1.4–7.7)
NEUTROS PCT: 43.3 % (ref 43.0–77.0)
Platelets: 245 10*3/uL (ref 150.0–400.0)
RBC: 4.56 Mil/uL (ref 3.87–5.11)
RDW: 13.1 % (ref 11.5–15.5)
WBC: 6.4 10*3/uL (ref 4.0–10.5)

## 2015-03-23 LAB — BASIC METABOLIC PANEL
BUN: 11 mg/dL (ref 6–23)
CO2: 29 meq/L (ref 19–32)
Calcium: 9.7 mg/dL (ref 8.4–10.5)
Chloride: 104 mEq/L (ref 96–112)
Creatinine, Ser: 0.96 mg/dL (ref 0.40–1.20)
GFR: 79.53 mL/min (ref >60.00)
Glucose, Bld: 64 mg/dL — ABNORMAL LOW (ref 70–99)
Potassium: 3.5 mEq/L (ref 3.5–5.1)
SODIUM: 139 meq/L (ref 135–145)

## 2015-03-23 LAB — TSH: TSH: 0.98 u[IU]/mL (ref 0.35–4.50)

## 2015-03-24 ENCOUNTER — Encounter: Payer: Self-pay | Admitting: General Practice

## 2015-03-24 ENCOUNTER — Telehealth: Payer: Self-pay | Admitting: Family Medicine

## 2015-03-24 ENCOUNTER — Encounter: Payer: Self-pay | Admitting: Interventional Cardiology

## 2015-03-24 ENCOUNTER — Ambulatory Visit (INDEPENDENT_AMBULATORY_CARE_PROVIDER_SITE_OTHER): Payer: 59 | Admitting: Interventional Cardiology

## 2015-03-24 VITALS — BP 128/80 | HR 102 | Ht 65.5 in | Wt 132.4 lb

## 2015-03-24 DIAGNOSIS — R002 Palpitations: Secondary | ICD-10-CM | POA: Diagnosis not present

## 2015-03-24 DIAGNOSIS — Z720 Tobacco use: Secondary | ICD-10-CM | POA: Diagnosis not present

## 2015-03-24 DIAGNOSIS — F17200 Nicotine dependence, unspecified, uncomplicated: Secondary | ICD-10-CM

## 2015-03-24 NOTE — Telephone Encounter (Signed)
Pt called back and stated please call [facility] [phone]  then work [phone]. Thank you

## 2015-03-24 NOTE — Telephone Encounter (Signed)
Relation to pt: self  Call back number: [phone]   Reason for call:  Pt inquiring about lab results

## 2015-03-24 NOTE — Patient Instructions (Signed)
Medication Instructions:  Your physician recommends that you continue on your current medications as directed. Please refer to the Current Medication list given to you today.  Labwork: None   Testing/Procedures: None   Follow-Up: Your physician recommends that you schedule a follow-up appointment as needed   Any Other Special Instructions Will Be Listed Below (If Applicable).   

## 2015-03-25 NOTE — Progress Notes (Signed)
Patient ID: [name], female   DOB: 04-23-1966, 49 y.o.   MRN: [id number]     Cardiology Office Note   Date:  03/25/2015   ID:  Akaya, Proffit 1965/10/13, MRN [id number]  PCP:  Garnet Koyanagi, DO    No chief complaint on file.  tobacco abuse   Wt Readings from Last 3 Encounters:  03/24/15 132 lb 6.4 oz (60.056 kg)  03/22/15 134 lb (60.782 kg)  12/20/14 131 lb 6.4 oz (59.603 kg)       History of Present Illness: [name] is a 49 y.o. female  with a history of tobacco abuse. She also admittedly has issues with anxiety. She is trying to stop smoking. Her doctor recommended that she try Chantix. She wants to come to the cardiologist to make sure that her heart was okay for this medication.  She walks regularly in her neighborhood. This includes several hills that she has to climb. She denies any chest discomfort. She has some mild shortness of breath when she reaches the top of the hill but she does not feel this is out of the ordinary for her. She denies any lower extremity swelling or trouble lying flat. She has very infrequent sensation of palpitations. She describes it as a skipped beat. Looking through her records, prior ECG has shown PACs. She has not had any sustained arrhythmia lasting anymore than just a few seconds. She denies lightheadedness or syncope.  Of note, she states she is more motivated now than ever to try and quit smoking. She tried patches but had a skin rash.    Past Medical History  Diagnosis Date  . Breast cancer 1999    2004  . Hyperlipidemia     Past Surgical History  Procedure Laterality Date  . Abdominal hysterectomy  2006    TAH,BSO secondary bleeding and breast cancer--  . Mastectomy      bilateral---1999/2004     Current Outpatient Prescriptions  Medication Sig Dispense Refill  . ALPRAZolam (XANAX) 0.25 MG tablet Take 1 tablet (0.25 mg total) by mouth 2 (two) times daily as needed for anxiety. 30 tablet 0  . aspirin 81 MG tablet  Take 1 tablet (81 mg total) by mouth daily. 30 tablet   . atorvastatin (LIPITOR) 10 MG tablet Take 1 tablet (10 mg total) by mouth daily. Repeat labs are due now 90 tablet 0  . escitalopram (LEXAPRO) 10 MG tablet Take 1 tablet (10 mg total) by mouth daily. 30 tablet 5  . varenicline (CHANTIX STARTING MONTH PAK) 0.5 MG X 11 & 1 MG X 42 tablet Take one 0.5 mg tablet by mouth once daily for 3 days, then increase to one 0.5 mg tablet twice daily for 4 days, then increase to one 1 mg tablet twice daily. 53 tablet 0   No current facility-administered medications for this visit.    Allergies:   Penicillins    Social History:  The patient  reports that she has been smoking Cigarettes.  She has a 13.5 pack-year smoking history. She has never used smokeless tobacco. She reports that she drinks alcohol. She reports that she does not use illicit drugs.   Family History:  The patient's family history includes Aortic aneurysm in her mother and another family member; Hyperlipidemia in her sister; Hypertension in an other family member; Stroke (age of onset: 60) in her sister.    ROS:  Please see the history of present illness.   Otherwise, review of systems  are positive for anxiety, infrequent palpitations.   All other systems are reviewed and negative.    PHYSICAL EXAM: VS:  BP 128/80 mmHg  Pulse 102  Ht 5' 5.5" (1.664 m)  Wt 132 lb 6.4 oz (60.056 kg)  BMI 21.69 kg/m2  SpO2 89% , BMI Body mass index is 21.69 kg/(m^2). GEN: Well nourished, well developed, in no acute distress HEENT: normal Neck: no JVD, carotid bruits, or masses Cardiac: RRR; no murmurs, rubs, or gallops,no edema  Respiratory:  clear to auscultation bilaterally, normal work of breathing GI: soft, nontender, nondistended, + BS MS: no deformity or atrophy Skin: warm and dry, no rash Neuro:  Strength and sensation are intact Psych: euthymic mood, full affect   EKG:   The ekg ordered recently demonstrates normal sinus rhythm,  PACs, no ST segment changes   Recent Labs: 02/22/2015: ALT 22 03/22/2015: BUN 11; Creatinine, Ser 0.96; Hemoglobin 13.7; Platelets 245.0; Potassium 3.5; Sodium 139; TSH 0.98   Lipid Panel    Component Value Date/Time   CHOL 144 02/22/2015 0807   TRIG 122.0 02/22/2015 0807   HDL 34.40* 02/22/2015 0807   CHOLHDL 4 02/22/2015 0807   VLDL 24.4 02/22/2015 0807   LDLCALC 85 02/22/2015 0807   LDLDIRECT 141.3 07/03/2012 1026     Other studies Reviewed: Additional studies/ records that were reviewed today with results demonstrating: February 2015: Echocardiogram 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     ASSESSMENT AND PLAN: I. Tobacco abuse: I commended her for trying to quit smoking. I don't see any contraindications for her to try Chantix. We spoke about the benefits of stopping smoking. I also recommended to her that if she is unsuccessful, she could try calling 1 [phone] quit now. .. The patient was counseled on the dangers of tobacco use, both inhaled and oral, which include, but are not limited to cardiovascular disease, increased cancer risk of multiple types of cancer, COPD, peripheral vascular disease, strokes. She was also counseled on the benefits of smoking cessation. The patient was firmly advised to quit.    We also reviewed strategies to maximize success, including: Removing cigarettes and smoking materials from environment Stress management Substitution of other forms of reinforcement Support of family/friends. Selecting a quit date. Patient provided contact information for 1-800-QUIT-NOW   II.  Palpitations: If they become more frequent, she will let us know. I would have her wear an event monitor if that were the case. For now, symptoms are infrequent and short-lived. Therefore, no further evaluation at this time.   III.    Anxiety: She inquired with me about her anxiety medications. I would defer to her primary  care physician.    Current medicines are reviewed at length with the patient today.  The patient concerns regarding her medicines were addressed.  The following changes have been made:  No change  Labs/ tests ordered today include:  No orders of the defined types were placed in this encounter.    Recommend 150 minutes/week of aerobic exercise Low fat, low carb, high fiber diet recommended  Disposition:   FU in when necessary   Teresita Madura., MD  03/25/2015 6:39 PM    [facility] [place], [place],   [address] Phone: [phone]; Fax: [phone]

## 2015-04-20 ENCOUNTER — Encounter: Payer: Self-pay | Admitting: Genetic Counselor

## 2015-08-16 ENCOUNTER — Ambulatory Visit: Payer: 59 | Admitting: Podiatry

## 2015-08-20 ENCOUNTER — Ambulatory Visit (INDEPENDENT_AMBULATORY_CARE_PROVIDER_SITE_OTHER): Payer: 59 | Admitting: Podiatry

## 2015-08-20 ENCOUNTER — Ambulatory Visit (INDEPENDENT_AMBULATORY_CARE_PROVIDER_SITE_OTHER): Payer: 59

## 2015-08-20 ENCOUNTER — Encounter: Payer: Self-pay | Admitting: Podiatry

## 2015-08-20 VITALS — BP 106/79 | HR 90 | Resp 16 | Ht 66.0 in | Wt 132.0 lb

## 2015-08-20 DIAGNOSIS — S93409A Sprain of unspecified ligament of unspecified ankle, initial encounter: Secondary | ICD-10-CM | POA: Diagnosis not present

## 2015-08-20 DIAGNOSIS — S99922A Unspecified injury of left foot, initial encounter: Secondary | ICD-10-CM | POA: Diagnosis not present

## 2015-08-20 DIAGNOSIS — S92912A Unspecified fracture of left toe(s), initial encounter for closed fracture: Secondary | ICD-10-CM

## 2015-08-20 DIAGNOSIS — M779 Enthesopathy, unspecified: Secondary | ICD-10-CM

## 2015-08-20 MED ORDER — TRIAMCINOLONE ACETONIDE 10 MG/ML IJ SUSP
10.0000 mg | Freq: Once | INTRAMUSCULAR | Status: AC
  Administered 2015-08-20: 10 mg

## 2015-08-22 NOTE — Progress Notes (Signed)
Subjective:     Patient ID: [name], female   DOB: [birth year], 49 y.o.   MRN: [id number]  HPI patient presents stating I dropped something and pain in my left fifth toe about 3 weeks ago and it's remaining very tender and I'm getting pain in my ankles of both feet and at times they feel unstable even though I'm not had any bad sprains   Review of Systems  All other systems reviewed and are negative.      Objective:   Physical Exam  Constitutional: She is oriented to person, place, and time.  Cardiovascular: Intact distal pulses.   Musculoskeletal: Normal range of motion.  Neurological: She is oriented to person, place, and time.  Skin: Skin is warm.  Nursing note and vitals reviewed.  neurovascular status intact muscle strength adequate range of motion within normal limits with patient noted to have inflammation and pain sinus tarsi bilateral and swelling and pain of the left fifth toe proximal and middle phalanx. Found have good digital perfusion well oriented with no excessive inversion of the ankle noted     Assessment:     Possible inflammatory condition of sinus tarsi bilateral versus chronic ankle sprain formation and probable fracture fifth toe left    Plan:     H&P x-rays reviewed and today I did careful sinus tarsi injection bilateral 3 mg Kenalog 5 mg Xylocaine and we will evaluate results. If symptoms persist patient will see Dr. Jacqualyn Posey for consideration of ankle stabilization procedures and also fifth digit left should go on and heal uneventfully after review of x-ray

## 2015-08-25 ENCOUNTER — Other Ambulatory Visit: Payer: Self-pay | Admitting: Physician Assistant

## 2015-08-25 ENCOUNTER — Telehealth: Payer: Self-pay | Admitting: Family Medicine

## 2015-08-25 MED ORDER — SCOPOLAMINE 1 MG/3DAYS TD PT72: 1.0000 | MEDICATED_PATCH | TRANSDERMAL | Status: DC

## 2015-08-25 NOTE — Telephone Encounter (Signed)
Discussed with patient via staff. No contraindications. Patches send to pharmacy.

## 2015-08-25 NOTE — Telephone Encounter (Signed)
Please advise      KP 

## 2015-08-25 NOTE — Telephone Encounter (Signed)
Caller name:Earsie   Relationship to patient: Self   Can be reached: [phone]  Pharmacy: [id number] - [place], [place]  Reason for call:  Pt says that she is going on a cruise to [place] tomorrow for 7 days. She says that she need 4-5 travel patches to put behind her ear sent to pharmacy.

## 2015-08-25 NOTE — Telephone Encounter (Signed)
Pt called back stating that she didn't receive her rx, pt states she specifically informed agent that she had spoken with dr. Etter Sjogren directly and she had told her to call in and make Kim aware that it was ok to send  the rx to the pharmacy due to the pt leaving for [place] in the morning.

## 2015-08-26 NOTE — Telephone Encounter (Signed)
Thank you :)

## 2015-10-31 ENCOUNTER — Encounter (HOSPITAL_COMMUNITY): Payer: Self-pay | Admitting: Emergency Medicine

## 2015-10-31 ENCOUNTER — Emergency Department (HOSPITAL_COMMUNITY)
Admission: EM | Admit: 2015-10-31 | Discharge: 2015-10-31 | Disposition: A | Discharge status: Home / Self Care | Payer: 59 | Attending: Emergency Medicine | Admitting: Emergency Medicine

## 2015-10-31 DIAGNOSIS — Z880 Allergy status to penicillin: Secondary | ICD-10-CM | POA: Diagnosis not present

## 2015-10-31 DIAGNOSIS — R000 Tachycardia, unspecified: Secondary | ICD-10-CM | POA: Diagnosis not present

## 2015-10-31 DIAGNOSIS — R202 Paresthesia of skin: Secondary | ICD-10-CM | POA: Insufficient documentation

## 2015-10-31 DIAGNOSIS — F17210 Nicotine dependence, cigarettes, uncomplicated: Secondary | ICD-10-CM | POA: Insufficient documentation

## 2015-10-31 DIAGNOSIS — E785 Hyperlipidemia, unspecified: Secondary | ICD-10-CM | POA: Insufficient documentation

## 2015-10-31 DIAGNOSIS — Z79899 Other long term (current) drug therapy: Secondary | ICD-10-CM | POA: Insufficient documentation

## 2015-10-31 DIAGNOSIS — I10 Essential (primary) hypertension: Secondary | ICD-10-CM | POA: Diagnosis not present

## 2015-10-31 DIAGNOSIS — Z7982 Long term (current) use of aspirin: Secondary | ICD-10-CM | POA: Diagnosis not present

## 2015-10-31 DIAGNOSIS — Z853 Personal history of malignant neoplasm of breast: Secondary | ICD-10-CM | POA: Insufficient documentation

## 2015-10-31 DIAGNOSIS — R110 Nausea: Secondary | ICD-10-CM | POA: Diagnosis not present

## 2015-10-31 DIAGNOSIS — R42 Dizziness and giddiness: Secondary | ICD-10-CM | POA: Diagnosis not present

## 2015-10-31 DIAGNOSIS — F419 Anxiety disorder, unspecified: Secondary | ICD-10-CM | POA: Diagnosis not present

## 2015-10-31 LAB — CBC
HEMATOCRIT: 42.6 % (ref 36.0–46.0)
HEMOGLOBIN: 14.4 g/dL (ref 12.0–15.0)
MCH: 30.4 pg (ref 26.0–34.0)
MCHC: 33.8 g/dL (ref 30.0–36.0)
MCV: 90.1 fL (ref 78.0–100.0)
Platelets: 246 10*3/uL (ref 150–400)
RBC: 4.73 MIL/uL (ref 3.87–5.11)
RDW: 13.3 % (ref 11.5–15.5)
WBC: 6.1 10*3/uL (ref 4.0–10.5)

## 2015-10-31 LAB — URINALYSIS, ROUTINE W REFLEX MICROSCOPIC
BILIRUBIN URINE: NEGATIVE
GLUCOSE, UA: NEGATIVE mg/dL
Hgb urine dipstick: NEGATIVE
KETONES UR: NEGATIVE mg/dL
Leukocytes, UA: NEGATIVE
NITRITE: NEGATIVE
PH: 5 (ref 5.0–8.0)
Protein, ur: NEGATIVE mg/dL
SPECIFIC GRAVITY, URINE: 1.007 (ref 1.005–1.030)

## 2015-10-31 LAB — HEPATIC FUNCTION PANEL
ALBUMIN: 3.8 g/dL (ref 3.5–5.0)
ALK PHOS: 99 U/L (ref 38–126)
ALT: 12 U/L — ABNORMAL LOW (ref 14–54)
AST: 15 U/L (ref 15–41)
BILIRUBIN TOTAL: 0.4 mg/dL (ref 0.3–1.2)
Total Protein: 7.7 g/dL (ref 6.5–8.1)

## 2015-10-31 LAB — BASIC METABOLIC PANEL
ANION GAP: 11 (ref 5–15)
BUN: 8 mg/dL (ref 6–20)
CO2: 27 mmol/L (ref 22–32)
Calcium: 10 mg/dL (ref 8.9–10.3)
Chloride: 104 mmol/L (ref 101–111)
Creatinine, Ser: 0.99 mg/dL (ref 0.44–1.00)
GFR calc Af Amer: >60 mL/min (ref >60)
GFR calc non Af Amer: >60 mL/min (ref >60)
Glucose, Bld: 99 mg/dL (ref 65–99)
POTASSIUM: 3.8 mmol/L (ref 3.5–5.1)
Sodium: 142 mmol/L (ref 135–145)

## 2015-10-31 LAB — LIPASE, BLOOD: Lipase: 18 U/L (ref 11–51)

## 2015-10-31 LAB — TROPONIN I

## 2015-10-31 NOTE — ED Notes (Signed)
Pt c/o lightheaded and nausea onset Wednesday. Pt also reports tingling to left arm and leg also onset Wednesday. Pt reports that when she turns her head to the right the tingling subsides. Speech clear, no facial droop, no arm drift, equal grips.

## 2015-10-31 NOTE — ED Provider Notes (Signed)
CSN: [id number]     Arrival date & time 10/31/15  [id number] History   First MD Initiated Contact with Patient 10/31/15 0919     Chief Complaint  Patient presents with  . Dizziness  . Nausea  . Tingling     (Consider location/radiation/quality/duration/timing/severity/associated sxs/prior Treatment) HPI Comments: 50yo F w/ PMH including breast cancer, HTN who p/w lightheadedness and tingling. The patient states that 4 days ago she began having intermittent episodes of nausea associated with lightheadedness. She denies any dizziness or room spinning sensation. The episodes come and go randomly. She has not had any associated vomiting, diarrhea, fevers, or cough/cold symptoms. She reports tingling in her left leg since then as well and she states that when she turns her head to tingling subsides. However, she later states that she has a long history of tingling which she thinks is related to restless leg syndrome. She denies any headache, visual changes, neck pain, back pain, chest pain, or shortness of breath. No extremity weakness. No problems with balance.  Patient is a 50 y.o. female presenting with dizziness. The history is provided by the patient.  Dizziness   Past Medical History  Diagnosis Date  . Breast cancer [facility]) 1999    2004  . Hyperlipidemia    Past Surgical History  Procedure Laterality Date  . Abdominal hysterectomy  2006    TAH,BSO secondary bleeding and breast cancer--  . Mastectomy      bilateral---1999/2004   Family History  Problem Relation Age of Onset  . Aortic aneurysm    . Hypertension    . Stroke Sister 70    after starting OCP  . Hyperlipidemia Sister   . Aortic aneurysm Mother    Social History  Substance Use Topics  . Smoking status: Current Every Day Smoker -- 0.50 packs/day for 27 years    Types: Cigarettes  . Smokeless tobacco: Never Used  . Alcohol Use: Yes   OB History    No data available     Review of Systems  Neurological: Positive for  dizziness.   10 Systems reviewed and are negative for acute change except as noted in the HPI.    Allergies  Penicillins  Home Medications   Prior to Admission medications   Medication Sig Start Date End Date Taking? Authorizing Provider  aspirin 81 MG tablet Take 1 tablet (81 mg total) by mouth daily. 12/02/13  Yes Geradine Girt, DO  ALPRAZolam (XANAX) 0.25 MG tablet Take 1 tablet (0.25 mg total) by mouth 2 (two) times daily as needed for anxiety. 03/22/15   Rosalita Chessman, DO  atorvastatin (LIPITOR) 10 MG tablet Take 1 tablet (10 mg total) by mouth daily. Repeat labs are due now 02/11/15   Rosalita Chessman, DO  escitalopram (LEXAPRO) 10 MG tablet Take 1 tablet (10 mg total) by mouth daily. 03/22/15   Rosalita Chessman, DO  scopolamine (TRANSDERM-SCOP, 1.5 MG,) 1 MG/3DAYS Place 1 patch (1.5 mg total) onto the skin every 3 (three) days. 08/25/15   Brunetta Jeans, PA-C  varenicline (CHANTIX STARTING MONTH PAK) 0.5 MG X 11 & 1 MG X 42 tablet Take one 0.5 mg tablet by mouth once daily for 3 days, then increase to one 0.5 mg tablet twice daily for 4 days, then increase to one 1 mg tablet twice daily. 03/22/15   Rosalita Chessman, DO   BP 111/66 mmHg  Pulse 77  Resp 16  Wt 133 lb 2 oz (60.385 kg)  SpO2  99% Physical Exam  Constitutional: She is oriented to person, place, and time. She appears well-developed and well-nourished. No distress.  Awake, alert  HENT:  Head: Normocephalic and atraumatic.  Eyes: Conjunctivae and EOM are normal. Pupils are equal, round, and reactive to light.  Neck: Normal range of motion. Neck supple.  Cardiovascular: Normal rate, regular rhythm and normal heart sounds.   No murmur heard. Pulmonary/Chest: Effort normal and breath sounds normal. No respiratory distress.  Abdominal: Soft. Bowel sounds are normal. She exhibits no distension.  Musculoskeletal: She exhibits no edema.  Neurological: She is alert and oriented to person, place, and time. She has normal reflexes.  No cranial nerve deficit. She exhibits normal muscle tone.  Fluent speech, normal finger-to-nose testing, negative pronator drift, no clonus, 5/5 strength and normal sensation x all 4 ext  Skin: Skin is warm and dry.  Psychiatric: She has a normal mood and affect. Judgment and thought content normal.  Nursing note and vitals reviewed.   ED Course  Procedures (including critical care time) Labs Review Labs Reviewed  HEPATIC FUNCTION PANEL - Abnormal; Notable for the following:    ALT 12 (*)    Bilirubin, Direct <0.1 (*)    All other components within normal limits  BASIC METABOLIC PANEL  CBC  URINALYSIS, ROUTINE W REFLEX MICROSCOPIC (NOT AT [facility])  LIPASE, BLOOD  TROPONIN I  CBG MONITORING, ED    Imaging Review No results found. I have personally reviewed and evaluated these lab results as part of my medical decision-making.   EKG Interpretation   Date/Time:  Sunday October 31 2015 08:41:55 EST Ventricular Rate:  108 PR Interval:  152 QRS Duration: 62 QT Interval:  346 QTC Calculation: 463 R Axis:   86 Text Interpretation:  Poor data quality, interpretation may be  adversely affected Sinus tachycardia Nonspecific T wave abnormality  Abnormal ECG No significant change since last tracing Confirmed by Rowin Bayron  MD, Cadience Bradfield [id number]) on 10/31/2015 9:20:23 AM       MDM   Final diagnoses:  Nausea  Episodic lightheadedness    Pt p/w 4 days of intermittent nausea and lightheadedness that occur randomly and are not associated with any other symptoms. She was mildly anxious but well-appearing at presentation. She was tachycardic on arrival but during my examination her vital signs were normal. Neurologic exam was normal. EKG shows no changes from previous. Obtained above lab work including LFTs and lipase. Gave the patient Zofran.  She states that she has been drinking a lot of water recently as she is trying to quit sodas therefore I feel dehydration is unlikely. She denies any  neck pain, upper extremity weakness, back pain, or lower Sherman any weakness to suggest spinal cord abnormality. She specifically states that she is not dizzy and denies any vertigo-like symptoms. With a normal neurologic exam, I do not feel she needs any head imaging at this time. No chest pain, diaphoresis, SOB to suggest PE or ACS. Her lab work shows no evidence of infection or electrolyte abnormality and no anemia. On reexamination, she denies any symptoms currently. She states that the nausea has not interfered with her appetite and she denies any abdominal pain. I've discussed the importance of follow-up with her PCP this week to ensure resolution of her symptoms. I spent a long time discussing return precautions including the development of any neurologic symptoms. Patient voiced understanding and was discharged in satisfactory condition.   Sharlett Iles, MD 10/31/15 [phone]

## 2015-10-31 NOTE — ED Notes (Signed)
Pt ambulated to bathroom with no difficulty 

## 2015-11-02 ENCOUNTER — Ambulatory Visit (INDEPENDENT_AMBULATORY_CARE_PROVIDER_SITE_OTHER): Payer: 59 | Admitting: Family Medicine

## 2015-11-02 ENCOUNTER — Encounter: Payer: Self-pay | Admitting: Family Medicine

## 2015-11-02 VITALS — BP 108/68 | HR 90 | Temp 98.4°F | Ht 66.0 in | Wt 133.2 lb

## 2015-11-02 DIAGNOSIS — F17200 Nicotine dependence, unspecified, uncomplicated: Secondary | ICD-10-CM

## 2015-11-02 DIAGNOSIS — E785 Hyperlipidemia, unspecified: Secondary | ICD-10-CM | POA: Diagnosis not present

## 2015-11-02 DIAGNOSIS — G4700 Insomnia, unspecified: Secondary | ICD-10-CM | POA: Diagnosis not present

## 2015-11-02 DIAGNOSIS — F411 Generalized anxiety disorder: Secondary | ICD-10-CM | POA: Diagnosis not present

## 2015-11-02 MED ORDER — ESCITALOPRAM OXALATE 10 MG PO TABS: 10.0000 mg | ORAL_TABLET | Freq: Every day | ORAL | Status: DC

## 2015-11-02 MED ORDER — ALPRAZOLAM 0.25 MG PO TABS: 0.2500 mg | ORAL_TABLET | Freq: Two times a day (BID) | ORAL | Status: DC | PRN

## 2015-11-02 MED ORDER — ATORVASTATIN CALCIUM 10 MG PO TABS: 10.0000 mg | ORAL_TABLET | Freq: Every day | ORAL | Status: DC

## 2015-11-02 NOTE — Assessment & Plan Note (Signed)
Pt has cut down a lot---down to 4 a day She knows she needs to quick all together

## 2015-11-02 NOTE — Progress Notes (Signed)
Pre visit review using our clinic review tool, if applicable. No additional management support is needed unless otherwise documented below in the visit note. 

## 2015-11-02 NOTE — Patient Instructions (Signed)
Generalized Anxiety Disorder Generalized anxiety disorder (GAD) is a mental disorder. It interferes with life functions, including relationships, work, and school. GAD is different from normal anxiety, which everyone experiences at some point in their lives in response to specific life events and activities. Normal anxiety actually helps us prepare for and get through these life events and activities. Normal anxiety goes away after the event or activity is over.  GAD causes anxiety that is not necessarily related to specific events or activities. It also causes excess anxiety in proportion to specific events or activities. The anxiety associated with GAD is also difficult to control. GAD can vary from mild to severe. People with severe GAD can have intense waves of anxiety with physical symptoms (panic attacks).  SYMPTOMS The anxiety and worry associated with GAD are difficult to control. This anxiety and worry are related to many life events and activities and also occur more days than not for 6 months or longer. People with GAD also have three or more of the following symptoms (one or more in children):  Restlessness.   Fatigue.  Difficulty concentrating.   Irritability.  Muscle tension.  Difficulty sleeping or unsatisfying sleep. DIAGNOSIS GAD is diagnosed through an assessment by your health care provider. Your health care provider will ask you questions aboutyour mood,physical symptoms, and events in your life. Your health care provider may ask you about your medical history and use of alcohol or drugs, including prescription medicines. Your health care provider may also do a physical exam and blood tests. Certain medical conditions and the use of certain substances can cause symptoms similar to those associated with GAD. Your health care provider may refer you to a mental health specialist for further evaluation. TREATMENT The following therapies are usually used to treat GAD:    Medication. Antidepressant medication usually is prescribed for long-term daily control. Antianxiety medicines may be added in severe cases, especially when panic attacks occur.   Talk therapy (psychotherapy). Certain types of talk therapy can be helpful in treating GAD by providing support, education, and guidance. A form of talk therapy called cognitive behavioral therapy can teach you healthy ways to think about and react to daily life events and activities.  Stress managementtechniques. These include yoga, meditation, and exercise and can be very helpful when they are practiced regularly. A mental health specialist can help determine which treatment is best for you. Some people see improvement with one therapy. However, other people require a combination of therapies.   This information is not intended to replace advice given to you by your health care provider. Make sure you discuss any questions you have with your health care provider.   Document Released: 01/20/2013 Document Revised: 10/16/2014 Document Reviewed: 01/20/2013 Elsevier Interactive Patient Education 2016 [employer] Inc.  

## 2015-11-02 NOTE — Progress Notes (Signed)
Patient ID: [name], female    DOB: [birth year]  Age: 50 y.o. MRN: [id number]    Subjective:  Subjective HPI [name] presents for f/u Er for light headed feeling and nausea that sent her to the ER.  Pt has not had another episode since.     Review of Systems  Constitutional: Negative for diaphoresis, appetite change, fatigue and unexpected weight change.  Eyes: Negative for pain, redness and visual disturbance.  Respiratory: Negative for cough, chest tightness, shortness of breath and wheezing.   Cardiovascular: Negative for chest pain, palpitations and leg swelling.  Endocrine: Negative for cold intolerance, heat intolerance, polydipsia, polyphagia and polyuria.  Genitourinary: Negative for dysuria, frequency and difficulty urinating.  Neurological: Negative for dizziness, light-headedness, numbness and headaches.    History Past Medical History  Diagnosis Date  . Breast cancer [facility]) 1999    2004  . Hyperlipidemia     She has past surgical history that includes Abdominal hysterectomy (2006) and Mastectomy.   Her family history includes Aortic aneurysm in her mother; Hyperlipidemia in her sister; Stroke (age of onset: 2) in her sister.She reports that she has been smoking Cigarettes.  She has a 13.5 pack-year smoking history. She has never used smokeless tobacco. She reports that she drinks alcohol. She reports that she does not use illicit drugs.  Current Outpatient Prescriptions on File Prior to Visit  Medication Sig Dispense Refill  . aspirin 81 MG tablet Take 1 tablet (81 mg total) by mouth daily. 30 tablet   . varenicline (CHANTIX STARTING MONTH PAK) 0.5 MG X 11 & 1 MG X 42 tablet Take one 0.5 mg tablet by mouth once daily for 3 days, then increase to one 0.5 mg tablet twice daily for 4 days, then increase to one 1 mg tablet twice daily. 53 tablet 0   No current facility-administered medications on file prior to visit.     Objective:  Objective Physical Exam    Constitutional: She is oriented to person, place, and time. She appears well-developed and well-nourished.  HENT:  Head: Normocephalic and atraumatic.  Eyes: Conjunctivae and EOM are normal.  Neck: Normal range of motion. Neck supple. No JVD present. Carotid bruit is not present. No thyromegaly present.  Cardiovascular: Normal rate, regular rhythm and normal heart sounds.   No murmur heard. Pulmonary/Chest: Effort normal and breath sounds normal. No respiratory distress. She has no wheezes. She has no rales. She exhibits no tenderness.  Musculoskeletal: She exhibits no edema.  Neurological: She is alert and oriented to person, place, and time.  Psychiatric: Her behavior is normal. Judgment and thought content normal. Her mood appears anxious.  Nursing note and vitals reviewed.  BP 108/68 mmHg  Pulse 90  Temp(Src) 98.4 F (36.9 C) (Oral)  Ht 5' 6"  (1.676 m)  Wt 133 lb 3.2 oz (60.419 kg)  BMI 21.51 kg/m2  SpO2 98% Wt Readings from Last 3 Encounters:  11/02/15 133 lb 3.2 oz (60.419 kg)  10/31/15 133 lb 2 oz (60.385 kg)  08/20/15 132 lb (59.875 kg)     Lab Results  Component Value Date   WBC 6.1 10/31/2015   HGB 14.4 10/31/2015   HCT 42.6 10/31/2015   PLT 246 10/31/2015   GLUCOSE 99 10/31/2015   CHOL 144 02/22/2015   TRIG 122.0 02/22/2015   HDL 34.40* 02/22/2015   LDLDIRECT 141.3 07/03/2012   LDLCALC 85 02/22/2015   ALT 12* 10/31/2015   AST 15 10/31/2015   NA 142 10/31/2015  K 3.8 10/31/2015   CL 104 10/31/2015   CREATININE 0.99 10/31/2015   BUN 8 10/31/2015   CO2 27 10/31/2015   TSH 0.98 03/22/2015   HGBA1C 5.6 12/02/2013   MICROALBUR 0.50 07/18/2013    No results found.   Assessment & Plan:  Plan I have discontinued Ms. Reali scopolamine. I am also having her maintain her aspirin, varenicline, atorvastatin, escitalopram, and ALPRAZolam.  Meds ordered this encounter  Medications  . atorvastatin (LIPITOR) 10 MG tablet    Sig: Take 1 tablet (10 mg total)  by mouth daily. Repeat labs are due now    Dispense:  90 tablet    Refill:  0  . escitalopram (LEXAPRO) 10 MG tablet    Sig: Take 1 tablet (10 mg total) by mouth daily.    Dispense:  30 tablet    Refill:  5  . ALPRAZolam (XANAX) 0.25 MG tablet    Sig: Take 1 tablet (0.25 mg total) by mouth 2 (two) times daily as needed for anxiety.    Dispense:  30 tablet    Refill:  0    Problem List Items Addressed This Visit      Unprioritized   TOBACCO USE    Pt has cut down a lot---down to 4 a day She knows she needs to quick all together      Generalized anxiety disorder - Primary    Maybe what caused nausea / tingling Pt stopped lexapro several months ago Restart meds rto 1 month or sooner prn      Relevant Medications   atorvastatin (LIPITOR) 10 MG tablet   escitalopram (LEXAPRO) 10 MG tablet   Other Relevant Orders   Ambulatory referral to Psychology   Comp Met (CMET)   POCT urinalysis dipstick   Microalbumin / creatinine urine ratio   Lipid panel    Other Visit Diagnoses    Hyperlipidemia        Relevant Medications    atorvastatin (LIPITOR) 10 MG tablet    Other Relevant Orders    Comp Met (CMET)    POCT urinalysis dipstick    Microalbumin / creatinine urine ratio    Lipid panel    Insomnia        Relevant Medications    ALPRAZolam (XANAX) 0.25 MG tablet       Follow-up: Return in about 4 weeks (around 11/30/2015), or if symptoms worsen or fail to improve, for anxiety.  Garnet Koyanagi, DO

## 2015-11-02 NOTE — Assessment & Plan Note (Signed)
Maybe what caused nausea / tingling Pt stopped lexapro several months ago Restart meds rto 1 month or sooner prn

## 2015-12-17 ENCOUNTER — Other Ambulatory Visit: Payer: Self-pay | Admitting: Family Medicine

## 2016-01-24 ENCOUNTER — Other Ambulatory Visit (INDEPENDENT_AMBULATORY_CARE_PROVIDER_SITE_OTHER): Payer: 59

## 2016-01-24 ENCOUNTER — Other Ambulatory Visit: Payer: 59

## 2016-01-24 DIAGNOSIS — E785 Hyperlipidemia, unspecified: Secondary | ICD-10-CM | POA: Diagnosis not present

## 2016-01-24 LAB — COMPREHENSIVE METABOLIC PANEL
ALK PHOS: 99 U/L (ref 39–117)
ALT: 16 U/L (ref 0–35)
AST: 14 U/L (ref 0–37)
Albumin: 4.2 g/dL (ref 3.5–5.2)
BILIRUBIN TOTAL: 0.4 mg/dL (ref 0.2–1.2)
BUN: 13 mg/dL (ref 6–23)
CO2: 27 meq/L (ref 19–32)
CREATININE: 0.97 mg/dL (ref 0.40–1.20)
Calcium: 10 mg/dL (ref 8.4–10.5)
Chloride: 106 mEq/L (ref 96–112)
GFR: 78.31 mL/min (ref >60.00)
Glucose, Bld: 81 mg/dL (ref 70–99)
Potassium: 4.4 mEq/L (ref 3.5–5.1)
Sodium: 141 mEq/L (ref 135–145)
TOTAL PROTEIN: 7.6 g/dL (ref 6.0–8.3)

## 2016-01-24 LAB — LIPID PANEL
CHOL/HDL RATIO: 5
CHOLESTEROL: 139 mg/dL (ref 0–200)
HDL: 30.7 mg/dL — ABNORMAL LOW (ref >39.00)
LDL CALC: 85 mg/dL (ref 0–99)
NonHDL: 108.49
TRIGLYCERIDES: 115 mg/dL (ref 0.0–149.0)
VLDL: 23 mg/dL (ref 0.0–40.0)

## 2016-01-24 LAB — MICROALBUMIN / CREATININE URINE RATIO
CREATININE, U: 165.4 mg/dL
Microalb Creat Ratio: 0.4 mg/g (ref 0.0–30.0)
Microalb, Ur: <0.7 mg/dL (ref 0.0–1.9)

## 2016-03-27 ENCOUNTER — Emergency Department (HOSPITAL_COMMUNITY)
Admission: EM | Admit: 2016-03-27 | Discharge: 2016-03-27 | Disposition: A | Discharge status: Home / Self Care | Payer: 59 | Attending: Emergency Medicine | Admitting: Emergency Medicine

## 2016-03-27 ENCOUNTER — Encounter (HOSPITAL_COMMUNITY): Payer: Self-pay | Admitting: *Deleted

## 2016-03-27 DIAGNOSIS — Z853 Personal history of malignant neoplasm of breast: Secondary | ICD-10-CM | POA: Insufficient documentation

## 2016-03-27 DIAGNOSIS — Z7952 Long term (current) use of systemic steroids: Secondary | ICD-10-CM | POA: Insufficient documentation

## 2016-03-27 DIAGNOSIS — Z79899 Other long term (current) drug therapy: Secondary | ICD-10-CM | POA: Diagnosis not present

## 2016-03-27 DIAGNOSIS — R439 Unspecified disturbances of smell and taste: Secondary | ICD-10-CM

## 2016-03-27 DIAGNOSIS — F17210 Nicotine dependence, cigarettes, uncomplicated: Secondary | ICD-10-CM | POA: Insufficient documentation

## 2016-03-27 DIAGNOSIS — R202 Paresthesia of skin: Secondary | ICD-10-CM | POA: Diagnosis present

## 2016-03-27 NOTE — Discharge Instructions (Signed)
You have been seen today for a smell dysfunction. Your physical exam showed no abnormalities or indications for emergent testing at this time. Follow up with neurology as soon as possible. Call the number provided set up an appointment. Follow up with PCP as needed. Return to ED should symptoms worsen.

## 2016-03-27 NOTE — ED Notes (Signed)
Pt reports for 3 days she has smelled  Some type of odor and reports she efunny on Lt side. Pt reports lots of stress. Pt keeps reporting a strong smell. Pt denies any vision changes ,any HA . While sitting in the chair  Pt straightened  Out both arms and  Reported she felt funny.

## 2016-03-27 NOTE — ED Notes (Signed)
Dr. Campos at bedside   

## 2016-03-27 NOTE — ED Notes (Signed)
Pt verbalized understanding of d/c instructions and follow-up care. No further questions/concerns, VSS, ambulatory w/ steady gait (refused wheelchair) 

## 2016-03-27 NOTE — ED Notes (Signed)
PT reports she smells an odor like diesel fuel in here nose  And mouth. Pt reports she looked in her room  At home  And could not find source. Pt reports the smell is stronger when the air blows  in her face

## 2016-03-27 NOTE — ED Provider Notes (Signed)
CSN: [id number]     Arrival date & time 03/27/16  [id number] History   None    Chief Complaint  Patient presents with  . Tingling     (Consider location/radiation/quality/duration/timing/severity/associated sxs/prior Treatment) HPI   [name] is a 50 y.o. female, with a history of anxiety, presenting to the ED with smell dysfunction. Patient states 3 days ago she began to intermittently smell an odor that she describes as "a mix between a chemical and diesel fuel." The smell lasts for a maximum 3 minutes at a time and then abruptly disappears. She endorses tingling in her limbs when she is anxious or stressed, worse on the left, but these symptoms are not new for her. Patient denies fever/chills, headache, neuro deficits, difficulty breathing, nausea/vomiting, or any other complaints.     Past Medical History  Diagnosis Date  . Breast cancer [facility]) 1999    2004  . Hyperlipidemia    Past Surgical History  Procedure Laterality Date  . Abdominal hysterectomy  2006    TAH,BSO secondary bleeding and breast cancer--  . Mastectomy      bilateral---1999/2004   Family History  Problem Relation Age of Onset  . Aortic aneurysm    . Hypertension    . Stroke Sister 80    after starting OCP  . Hyperlipidemia Sister   . Aortic aneurysm Mother    Social History  Substance Use Topics  . Smoking status: Current Every Day Smoker -- 0.50 packs/day for 27 years    Types: Cigarettes  . Smokeless tobacco: Never Used  . Alcohol Use: Yes   OB History    No data available     Review of Systems  Constitutional: Negative for fever and chills.  HENT: Negative for congestion, facial swelling, trouble swallowing and voice change.        Smells a strange odor.  Respiratory: Negative for shortness of breath.   Cardiovascular: Negative for chest pain.  Gastrointestinal: Negative for nausea and vomiting.  Neurological: Negative for dizziness, seizures, syncope, weakness, light-headedness,  numbness and headaches.  All other systems reviewed and are negative.    Allergies  Penicillins  Home Medications   Prior to Admission medications   Medication Sig Start Date End Date Taking? Authorizing Provider  ALPRAZolam (XANAX) 0.25 MG tablet Take 1 tablet (0.25 mg total) by mouth 2 (two) times daily as needed for anxiety. 11/02/15  Yes Yvonne R Lowne Chase, DO  aspirin 81 MG tablet Take 1 tablet (81 mg total) by mouth daily. 12/02/13  Yes Jessica U Vann, DO  atorvastatin (LIPITOR) 10 MG tablet TAKE 1 TABLET(10 MG) BY MOUTH DAILY. REPEAT LABS ARE DUE NOW 12/17/15  Yes Yvonne R Lowne Chase, DO  escitalopram (LEXAPRO) 10 MG tablet Take 1 tablet (10 mg total) by mouth daily. Patient not taking: Reported on 03/27/2016 11/02/15   Rosalita Chessman Chase, DO  varenicline (CHANTIX STARTING MONTH PAK) 0.5 MG X 11 & 1 MG X 42 tablet Take one 0.5 mg tablet by mouth once daily for 3 days, then increase to one 0.5 mg tablet twice daily for 4 days, then increase to one 1 mg tablet twice daily. Patient not taking: Reported on 03/27/2016 03/22/15   Alferd Apa Lowne Chase, DO   BP 124/84 mmHg  Pulse 87  Resp 16  SpO2 99% Physical Exam  Constitutional: She is oriented to person, place, and time. She appears well-developed and well-nourished. No distress.  HENT:  Head: Normocephalic and atraumatic.  Nose:  Nose normal.  Mouth/Throat: Oropharynx is clear and moist.  Nose is clear and normal. No foreign bodies appreciated. Olfactory sensation tested with patient able to successfully discern the smell of alcohol, even while the other abnormal smell is present. Myself nor other staff members appreciate an abnormal odor.  Eyes: Conjunctivae and EOM are normal. Pupils are equal, round, and reactive to light.  Neck: Normal range of motion. Neck supple.  Cardiovascular: Normal rate, regular rhythm, normal heart sounds and intact distal pulses.   Pulmonary/Chest: Effort normal and breath sounds normal. No respiratory  distress.  Abdominal: Soft. There is no tenderness. There is no guarding.  Musculoskeletal: She exhibits no edema.  Full ROM in all extremities and spine. No paraspinal tenderness.   Lymphadenopathy:    She has no cervical adenopathy.  Neurological: She is alert and oriented to person, place, and time. She has normal reflexes.  No sensory deficits. Strength 5/5 in all extremities. No gait disturbance. Coordination intact. Cranial nerves III-XII grossly intact. No facial droop.   Skin: Skin is warm and dry. She is not diaphoretic.  Psychiatric: She has a normal mood and affect. Her behavior is normal.  Nursing note and vitals reviewed.   ED Course  Procedures (including critical care time)   MDM   Final diagnoses:  Smell disturbance    [name] presents with complaint of intermittent smelling of an abnormal odor for the last 3 days.  Findings and plan of care discussed with Jola Schmidt, MD. Dr. Venora Maples personally evaluated and examined this patient.  Patient has no other indications of neurologic abnormalities. No functional deficits. No signs of seizure. No red flag symptoms. No indication for emergent labs or imaging at this time. There may be an element of anxiety. Patient referred to neurology. Return precautions discussed. Patient voiced understanding of these instructions, accepts the plan, and is comfortable with discharge.    Lorayne Bender, PA-C 03/27/16 [place], MD 03/28/16 [phone]

## 2016-03-28 ENCOUNTER — Other Ambulatory Visit: Payer: Self-pay | Admitting: Family Medicine

## 2016-03-30 ENCOUNTER — Telehealth: Payer: Self-pay | Admitting: Family Medicine

## 2016-03-30 DIAGNOSIS — G4700 Insomnia, unspecified: Secondary | ICD-10-CM

## 2016-03-30 MED ORDER — ALPRAZOLAM 0.25 MG PO TABS: 0.2500 mg | ORAL_TABLET | Freq: Two times a day (BID) | ORAL | Status: DC | PRN

## 2016-03-30 NOTE — Telephone Encounter (Signed)
Can be reached: [phone] cell  Pharmacy: [id number] - [place], [address]  Reason for call: pt said that she is needing refill on alprazolam. She said her aunt was just admitted to hospice care and is dying. She said that she is stressing. She will call back to schedule f/u.

## 2016-03-30 NOTE — Telephone Encounter (Signed)
Rx faxed.    KP 

## 2016-03-30 NOTE — Telephone Encounter (Signed)
Ok to refill x 1  

## 2016-03-30 NOTE — Telephone Encounter (Signed)
Last seen and filled 11/02/15 #30   Please advise    KP

## 2016-04-03 ENCOUNTER — Ambulatory Visit (INDEPENDENT_AMBULATORY_CARE_PROVIDER_SITE_OTHER): Payer: 59 | Admitting: Neurology

## 2016-04-03 ENCOUNTER — Encounter: Payer: Self-pay | Admitting: Neurology

## 2016-04-03 VITALS — BP 116/80 | HR 96 | Ht 66.0 in | Wt 132.6 lb

## 2016-04-03 DIAGNOSIS — R431 Parosmia: Secondary | ICD-10-CM | POA: Diagnosis not present

## 2016-04-03 DIAGNOSIS — F411 Generalized anxiety disorder: Secondary | ICD-10-CM

## 2016-04-03 DIAGNOSIS — R41840 Attention and concentration deficit: Secondary | ICD-10-CM | POA: Diagnosis not present

## 2016-04-03 DIAGNOSIS — R202 Paresthesia of skin: Secondary | ICD-10-CM | POA: Diagnosis not present

## 2016-04-03 DIAGNOSIS — E236 Other disorders of pituitary gland: Secondary | ICD-10-CM | POA: Diagnosis not present

## 2016-04-03 DIAGNOSIS — J324 Chronic pansinusitis: Secondary | ICD-10-CM

## 2016-04-03 DIAGNOSIS — J392 Other diseases of pharynx: Secondary | ICD-10-CM

## 2016-04-03 NOTE — Patient Instructions (Signed)
Remember to drink plenty of fluid, eat healthy meals and do not skip any meals. Try to eat protein with a every meal and eat a healthy snack such as fruit or nuts in between meals. Try to keep a regular sleep-wake schedule and try to exercise daily, particularly in the form of walking, 20-30 minutes a day, if you can.   As far as diagnostic testing: MRI of the brain  I would like to see you back as needed, sooner if we need to. Please call us with any interim questions, concerns, problems, updates or refill requests.   Our phone number is [phone]. We also have an after hours call service for urgent matters and there is a physician on-call for urgent questions. For any emergencies you know to call 911 or go to the nearest emergency room

## 2016-04-03 NOTE — Progress Notes (Addendum)
[facility]    Provider:  Dr Jaynee Eagles Referring Provider: Carollee Herter, Alferd Apa, * Primary Care Physician:  [name], DO  CC:  Abnormal smells. Left-sided paresthesias  HPI:  [name] is a 50 y.o. female here as a referral from Dr. Carollee Herter for abnormal smells new onset recently and left-sided paresthesias for 6 years. Past medical history anxiety, HLD. She recently has episode of smelling gas/diesel that is not there.  She is under a lot of stress due to her Aunt's AML recently placed into hospice. During an episode of anxiety she smelled diesel and gas, brief, had multiple episodes over 4-6 days. Her aunt is dying, she is having a hard time with it. She has tingling left side of the body usually worse when she is nervous/anxious. She had the abnormal smells off an or for 4 days, would last briefly. Her whole left side is tingly and heavy. Can last briefly, worse with stress. No weakness. No aphasia or dysarthria. She has difficulty concentrating, worse with stress. She has difficulty sleeping. She has insomnia. Sleeping is poor. No head trauma or other inciting events. She is feeling very fatigued, tired, decreased concentration. She is scared about her aunt dying. She is afraid she is having TIAs as well. She smoked for 10 years, she is trying to quit. She has 3 sisters, she handles everything for her Aunt who is like her mother. No headaches, no new vision changes, no other focal neurologic deficits. Alprazolam helps with her symptoms. She was hospitalized 6 years ago and evaluation for stroke was negative. 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 She had some mild dysfunction. 3 days previously she began to intermittently smelling odor as a mix between chemical and diesel fuel. In the  smell last for a maximum of 3 minutes at a time and abruptly disappears. She also has anxiety with tingling in her limbs. But the symptoms are not new for her. She denied any new fevers chills, headache, neurologic deficits, difficulty breathing, nausea vomiting or other complaints. Exam was normal including general exam and neurologic exam. Since there are no indications of neurologic abnormalities or functional deficits aresigns of seizures they diagnosed her with possible anxiety and referral to neurology. No imaging was completed.  Patient was also seen in 2015 and admitted for right facial droop. Imaging showed no stroke. No signs of MS or other etiologies and MRI. No stenosis or significant issues on vascular imaging. No resultant neuro deficits. Was not felt to be a stroke.  CMP normal.  LDL 85.   FINDINGS: MRI HEAD 2015 (personally reviewed images and agree with the following)  No evidence for acute infarction, hemorrhage, mass lesion, hydrocephalus, or extra-axial fluid. No atrophy or white matter disease. Flow voids are maintained throughout the carotid, basilar, and vertebral arteries. There are no areas of chronic hemorrhage. Pituitary, pineal, and cerebellar tonsils unremarkable. No upper cervical lesions.  Negative orbits, and osseous structures. Chronic bilateral maxillary sinus disease. Chronic bilateral ethmoid sinus disease. 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 Trace left mastoid effusion.  Good general agreement prior CT.  MRA HEAD FINDINGS  Internal carotid arteries widely patent. Basilar artery widely patent with left greater than right vertebrals contributing. Fetal origin left PCA. 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 No intracranial stenosis or dissection. No visible intracranial aneurysm.  IMPRESSION: No acute or focal  intracranial abnormality. Nasopharyngeal Thornwaldt cyst of moderate size, incidental finding. No intracranial flow reducing lesion is evident.    MRI of the brain and MRA of the head and neck May 2012 (personally reviewed images and agree with the following): This MRI of the brain with and without contrast shows the following: 1.   The pituitary gland is enlarged. It appears to enhance homogenously. Dedicated pituitary imaging with MRI and endocrine labs are recommended to better assess for the possibility of a microadenoma. 2.  There is mild age appropriate cortical atrophy and minimal age-appropriate chronic microvascular ischemic change. 3.  There are no acute findings.  MRA HEAD  Findings: Both vertebral arteries are patent to the basilar. PICA and superior cerebellar and posterior cerebral arteries are patent bilaterally without significant stenosis. Hypoplastic left P1 segment with fetal origin of the left posterior cerebral artery. This is a normal variant.  The internal carotid artery is patent bilaterally without stenosis. Anterior and middle cerebral arteries are patent bilaterally.  Negative for aneurysm.  IMPRESSION: Negative  MRA NECK  Findings: Both vertebral arteries are patent to the basilar without stenosis or irregularity.  Both carotid arteries are widely patent without significant stenosis. Negative for aneurysm or dissection.  IMPRESSION: Negative  Carotid Doppler 2015 No evidence of hemodynamically significant internal carotid artery stenosis. Vertebral artery flow is antegrade  Review of Systems: Patient complains of symptoms per HPI as well as the following symptoms: no CP, no SOB. Pertinent negatives per HPI. All others negative.   Social History   Social History  . Marital Status: Single    Spouse Name: N/A  . Number of Children: 1  . Years of Education: 16   Occupational History  . works for [employer]; office  specialist    Social History Main Topics  . Smoking status: Current Every Day Smoker -- 0.50 packs/day for 27 years    Types: Cigarettes  . Smokeless tobacco: Never Used  . Alcohol Use: 0.0 oz/week    0 Standard drinks or equivalent per week     Comment: Rare  . Drug Use: No  . Sexual Activity: Not Currently   Other Topics Concern  . Not on file   Social History Narrative   Marital status: single      Children: one daughter      Lives: with sister      Employment: works for [employer]; office specialist      Education: Multimedia programmer      Tobacco: trying to quit      Alcohol: none; rare      Drugs: none      Exercise: walking daily 2 miles      Caffeine use: Tea/pepsi- 1 pepsi/morning      Tea- 3 cups per day          Family History  Problem Relation Age of Onset  . Aortic aneurysm    . Hypertension    . Stroke Sister 75    after starting OCP  . Hyperlipidemia Sister   . Aortic aneurysm Mother   . Seizures Neg Hx   . Migraines Neg Hx     Past Medical History  Diagnosis Date  . Breast cancer [facility]) 1999    2004  . Hyperlipidemia   . Headache  Past Surgical History  Procedure Laterality Date  . Abdominal hysterectomy  2006    TAH,BSO secondary bleeding and breast cancer--  . Mastectomy      bilateral---1999/2004    Current Outpatient Prescriptions  Medication Sig Dispense Refill  . ALPRAZolam (XANAX) 0.25 MG tablet Take 1 tablet (0.25 mg total) by mouth 2 (two) times daily as needed for anxiety. 30 tablet 0  . aspirin 81 MG tablet Take 1 tablet (81 mg total) by mouth daily. 30 tablet   . atorvastatin (LIPITOR) 10 MG tablet TAKE 1 TABLET(10 MG) BY MOUTH DAILY. REPEAT LABS ARE DUE NOW 90 tablet 0  . nicotine polacrilex (NICORETTE) 4 MG gum Take 4 mg by mouth as needed for smoking cessation.     No current facility-administered medications for this visit.    Allergies as of 04/03/2016 - Review Complete 04/03/2016  Allergen  Reaction Noted  . Penicillins Other (See Comments) 10/11/2010    Vitals: BP 116/80 mmHg  Pulse 96  Ht 5\' 6"  (1.676 m)  Wt 132 lb 9.6 oz (60.147 kg)  BMI 21.41 kg/m2 Last Weight:  Wt Readings from Last 1 Encounters:  04/03/16 132 lb 9.6 oz (60.147 kg)   Last Height:   Ht Readings from Last 1 Encounters:  04/03/16 5\' 6"  (1.676 m)   Physical exam: Exam: Gen: NAD, conversant, well nourised, well groomed                     CV: RRR, no MRG. No Carotid Bruits. No peripheral edema, warm, nontender Eyes: Conjunctivae clear without exudates or hemorrhage  Neuro: Detailed Neurologic Exam  Speech:    Speech is normal; fluent and spontaneous with normal comprehension.  Cognition:    The patient is oriented to person, place, and time;     recent and remote memory intact;     language fluent;     normal attention, concentration,     fund of knowledge Cranial Nerves:    The pupils are equal, round, and reactive to light. The fundi are normal and spontaneous venous pulsations are present. Visual fields are full to finger confrontation. Extraocular movements are intact. Trigeminal sensation is intact and the muscles of mastication are normal. The face is symmetric. The palate elevates in the midline. Hearing intact. Voice is normal. Shoulder shrug is normal. The tongue has normal motion without fasciculations.   Coordination:    Normal finger to nose and heel to shin. Normal rapid alternating movements.   Gait:    Heel-toe and tandem gait are normal.   Motor Observation:    No asymmetry, no atrophy, and no involuntary movements noted. Tone:    Normal muscle tone.    Posture:    Posture is normal. normal erect    Strength:    Strength is V/V in the upper and lower limbs.      Sensation: intact to LT     Reflex Exam:  DTR's:    Deep tendon reflexes in the upper and lower extremities are normal bilaterally.   Toes:    The toes are downgoing bilaterally.   Clonus:    Clonus  is absent.       Assessment/Plan:  Very lovely 50 year old with left-sided paresthesias and episodes of abnormal smells. She is under a lot of stress.   - EEG for epileptiform activity. - Aspirin 81mg  daily for stroke prevention as she has had facial droopin gin the past and worked up for TIA, however low  likelihood these current symptoms represent TIA - LDL 85, will hold off on  - Will repeat MRI of the brain with and without contrast with thin cuts through the pituitary gland as this was shown to be enlarged in the past. 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 A Tornwaldt's cyst is a benign cyst located in the upper posterior nasopharynx. It is a relatively rare lesion and most are small and asymptomatic whereas some cause nasal obstruction, postnasal drip, occipital headache or eustachian tube dysfunction. She has chronic sinusitis will refer to ENT.    Addendum: MRI of the brain: 06/22/2016 :  The brain parenchyma shows no definite abnormalities. On flair images solitary tiny punctate areas of hyperintensity is noted in the left lateral pons but it is not seen on any other sequences and is of unclear significance. Diffusion-weighted imaging is negative for acute ischemia. Calvarium show no abnormality. Orbits appear unremarkable. Paranasal sinuses show evidence of chronic extremity changes involving bilateral maxillary, ethmoid sinuses mainly and to lesser degree mastoids as well. 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 The pituitary gland shows slight borderline enlargement of size but no distinct adenoma is noted even on thin sections and contrast administration and coronary dynamic imaging. The flow voids are large vessels of intracranial circulation appear to be patent. Visualized portion of upper cervical  spine shows no abnormality. Subarachnoid spaces and ventricular system appear normal.   IMPRESSION:  Abnormal MRI scan of the brain showing chronic changes severe maxillary and ethmoid paranasal sinusitis. Incidental large Thornwaldt cyst is noted. Overall no significant change compared with previous MRI scan dated 12/01/2013. The pituitary gland is slightly enlarged but no distinct adenoma is noted   I had a long d/w patient personally independently reviewed imaging studies and evaluation results and answered questions.Continue ASA 81 mg daily and maintain strict control of hypertension with blood pressure goal below 130/90, diabetes with hemoglobin A1c goal below 6.5% and lipids with LDL cholesterol goal below 70 mg/dL. I also advised the patient to eat a healthy diet with plenty of whole grains, cereals, fruits and vegetables, exercise regularly and maintain ideal body weight, stop smoking,.  Sarina Ill, MD  [facility] [address] [place], [place] [address]  Phone [phone] Fax [phone]

## 2016-04-17 ENCOUNTER — Encounter: Payer: Self-pay | Admitting: Neurology

## 2016-04-17 ENCOUNTER — Ambulatory Visit (INDEPENDENT_AMBULATORY_CARE_PROVIDER_SITE_OTHER): Payer: 59

## 2016-04-17 ENCOUNTER — Telehealth: Payer: Self-pay

## 2016-04-17 DIAGNOSIS — R202 Paresthesia of skin: Secondary | ICD-10-CM

## 2016-04-17 DIAGNOSIS — R2990 Unspecified symptoms and signs involving the nervous system: Secondary | ICD-10-CM | POA: Diagnosis not present

## 2016-04-17 DIAGNOSIS — R431 Parosmia: Secondary | ICD-10-CM

## 2016-04-17 NOTE — Telephone Encounter (Signed)
I spoke to pt. I advised her that her EEG cannot be performed at 8:30 because the tech is out of the office today. Pt will call her work and she if she is able to be off in the afternoon at 1:30 to have to EEG performed and call us back.  If not, she is agreeable to r/s the EEG for another day.

## 2016-04-17 NOTE — Telephone Encounter (Signed)
I called pt to reschedule her EEG--[name] will not be here today. No answer, left a message asking her to call me back. Will try to call her again in a few minutes.

## 2016-04-19 NOTE — Procedures (Signed)
   HISTORY: 50 years old female, presented with episode of abnormal smells  TECHNIQUE:  16 channel EEG was performed based on standard 10-16 international system. One channel was dedicated to EKG, which has demonstrates regular sinus rhythm of 108 beats per minutes.  Upon awakening, the posterior background activity was well-developed, in alpha range, 10 Hz, reactive to eye opening and closure.  There was no evidence of epileptiform discharge.  Photic stimulation was performed, which induced a symmetric photic driving.  Hyperventilation was performed, there was no abnormality elicit.  No sleep was achieved.  CONCLUSION: This is a  normal awake EEG.  There is no electrodiagnostic evidence of epileptiform discharge.  Marcial Pacas, M.D. Ph.D.  [facility] [address], [place] [address] Phone: [phone] Fax:      [phone]

## 2016-04-20 ENCOUNTER — Telehealth: Payer: Self-pay | Admitting: *Deleted

## 2016-04-20 NOTE — Telephone Encounter (Signed)
Per Dr Jaynee Eagles, spoke with patient and informed her that her EEG results were normal, no seizures or abnormal brain activity. She asked "what do I do next?" reviewed Dr Cathren Laine instructions, information in her assessment/plan in  last office visit note. Patient stated she has stopped smoking and is receiving therapy for her anxiety and stress. Informed her that Dr Jaynee Eagles ordered MRI, and she should hear about scheduling that. She verbalized understanding, appreciation.

## 2016-05-01 ENCOUNTER — Telehealth: Payer: Self-pay | Admitting: *Deleted

## 2016-05-01 NOTE — Telephone Encounter (Signed)
Per Dr Jaynee Eagles, spoke with patient and scheduled her for 3 month FU. Advised she arrive 15 min early. She verbalized understanding, stated she would note appt in her phone.

## 2016-05-05 ENCOUNTER — Telehealth: Payer: Self-pay | Admitting: Neurology

## 2016-05-05 NOTE — Telephone Encounter (Signed)
Patient is calling to schedule an MRI. °

## 2016-06-05 ENCOUNTER — Ambulatory Visit (INDEPENDENT_AMBULATORY_CARE_PROVIDER_SITE_OTHER): Payer: 59

## 2016-06-05 ENCOUNTER — Encounter: Payer: Self-pay | Admitting: Podiatry

## 2016-06-05 ENCOUNTER — Telehealth: Payer: Self-pay | Admitting: *Deleted

## 2016-06-05 ENCOUNTER — Ambulatory Visit (INDEPENDENT_AMBULATORY_CARE_PROVIDER_SITE_OTHER): Payer: 59 | Admitting: Podiatry

## 2016-06-05 DIAGNOSIS — M25572 Pain in left ankle and joints of left foot: Secondary | ICD-10-CM

## 2016-06-05 DIAGNOSIS — M779 Enthesopathy, unspecified: Secondary | ICD-10-CM | POA: Diagnosis not present

## 2016-06-05 DIAGNOSIS — S93409A Sprain of unspecified ligament of unspecified ankle, initial encounter: Secondary | ICD-10-CM

## 2016-06-05 NOTE — Telephone Encounter (Signed)
MRI orders given to D. Meadows for [facility] [employer].

## 2016-06-07 NOTE — Progress Notes (Signed)
Subjective:     Patient ID: [name], female   DOB: [birth year], 50 y.o.   MRN: [id number]  HPI patient presents with pain in the left ankle lateral side with moderate injury that occurred with no history of significant sprained   Review of Systems     Objective:   Physical Exam Neurovascular status intact muscle strength was adequate with mild reduction of motion left ankle secondary to splinting. Patient's found to have mild to moderate inflammation associated with it with no indications of acute bony injury    Assessment:     Inflammatory changes and cannot rule out bone injury    Plan:     X-rays taken reviewed and advised on physical therapy and compression and reappoint 4 weeks or earlier if needed  X-rays taken negative for signs that there is a diastases injury or indications of pathology from that standpoint

## 2016-06-16 ENCOUNTER — Other Ambulatory Visit: Payer: 59

## 2016-06-16 ENCOUNTER — Inpatient Hospital Stay: Admission: RE | Admit: 2016-06-16 | Payer: 59 | Source: Ambulatory Visit

## 2016-06-20 ENCOUNTER — Telehealth: Payer: Self-pay | Admitting: Neurology

## 2016-06-20 NOTE — Telephone Encounter (Signed)
[place], [place] [phone] called, please call back.

## 2016-06-22 ENCOUNTER — Ambulatory Visit
Admission: RE | Admit: 2016-06-22 | Discharge: 2016-06-22 | Disposition: A | Discharge status: Home / Self Care | Payer: 59 | Source: Ambulatory Visit | Attending: Podiatry | Admitting: Podiatry

## 2016-06-22 ENCOUNTER — Other Ambulatory Visit: Payer: 59

## 2016-06-22 ENCOUNTER — Other Ambulatory Visit: Payer: Self-pay

## 2016-06-22 ENCOUNTER — Ambulatory Visit
Admission: RE | Admit: 2016-06-22 | Discharge: 2016-06-22 | Disposition: A | Discharge status: Home / Self Care | Payer: 59 | Source: Ambulatory Visit | Attending: Neurology | Admitting: Neurology

## 2016-06-22 DIAGNOSIS — R202 Paresthesia of skin: Secondary | ICD-10-CM

## 2016-06-22 DIAGNOSIS — R41840 Attention and concentration deficit: Secondary | ICD-10-CM

## 2016-06-22 DIAGNOSIS — E236 Other disorders of pituitary gland: Secondary | ICD-10-CM

## 2016-06-22 DIAGNOSIS — R431 Parosmia: Secondary | ICD-10-CM

## 2016-06-22 MED ORDER — GADOBENATE DIMEGLUMINE 529 MG/ML IV SOLN
6.0000 mL | Freq: Once | INTRAVENOUS | Status: AC | PRN
  Administered 2016-06-22: 6 mL via INTRAVENOUS

## 2016-06-26 ENCOUNTER — Telehealth: Payer: Self-pay | Admitting: Neurology

## 2016-06-26 ENCOUNTER — Ambulatory Visit (INDEPENDENT_AMBULATORY_CARE_PROVIDER_SITE_OTHER): Payer: 59 | Admitting: Podiatry

## 2016-06-26 ENCOUNTER — Encounter: Payer: Self-pay | Admitting: Podiatry

## 2016-06-26 DIAGNOSIS — M85461 Solitary bone cyst, right tibia and fibula: Secondary | ICD-10-CM

## 2016-06-26 DIAGNOSIS — M25572 Pain in left ankle and joints of left foot: Secondary | ICD-10-CM | POA: Diagnosis not present

## 2016-06-26 DIAGNOSIS — M958 Other specified acquired deformities of musculoskeletal system: Secondary | ICD-10-CM

## 2016-06-26 NOTE — Addendum Note (Signed)
Addended by: Sarina Ill B on: 06/26/2016 07:06 PM   Modules accepted: Orders

## 2016-06-26 NOTE — Telephone Encounter (Signed)
Spoke to patient. The brain was normal nothing to explain her symptoms. However It does show severe maxillary and ethmoid sinusitis and a thornwaldt cyst. A Tornwaldt's cyst is a benign cyst located in the upper posterior nasopharynx. It is a relatively rare lesion and most are small and asymptomatic whereas some cause nasal obstruction, postnasal drip, occipital headache or eustachian tube dysfunction. She has chronic sinusitis will refer to ENT.    FINDINGS:  The brain parenchyma shows no definite abnormalities. On flair images solitary tiny punctate areas of hyperintensity is noted in the left lateral pons but it is not seen on any other sequences and is of unclear significance. Diffusion-weighted imaging is negative for acute ischemia. Calvarium show no abnormality. Orbits appear unremarkable. Paranasal sinuses show evidence of chronic extremity changes involving bilateral maxillary, ethmoid sinuses mainly and to lesser degree mastoids as well. 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 The pituitary gland shows slight borderline enlargement of size but no distinct adenoma is noted even on thin sections and contrast administration and coronary dynamic imaging. The flow voids are large vessels of intracranial circulation appear to be patent. Visualized portion of upper cervical spine shows no abnormality. Subarachnoid spaces and ventricular system appear normal.   IMPRESSION:  Abnormal MRI scan of the brain showing chronic changes severe maxillary and ethmoid paranasal sinusitis. Incidental large Thornwaldt cyst is noted. Overall no significant change compared with previous MRI scan dated 12/01/2013. The pituitary gland is slightly enlarged but no distinct adenoma is noted

## 2016-06-27 NOTE — Progress Notes (Signed)
Subjective: 50 year old female presents the opposite discuss MRI results. She states that overall she is doing well same as she did last time she saw Dr. Paulla Dolly. She's had ongoing right ankle pain for about 25 years. She states that she is walking she feels that sometimes her ankle will give out and is intermittent and not on daily basis. She also states that she'll get increased swelling and pain the last hour and then I will resolved. She previously MRI in 2009 she was told that she had possible bone tumor but this was ruled out. Denies any recent injury or trauma. She denies any history of an infection to her ankle. She's had no recent treatment. She had an injection which she states did not help. Denies any systemic complaints such as fevers, chills, nausea, vomiting. No acute changes since last appointment, and no other complaints at this time.   Objective: AAO x3, NAD DP/PT pulses palpable bilaterally, CRT less than 3 seconds There is diffuse tenderness of the right ankle and she describes at the level of a 3 out of 10 today. There is tenderness on the course the ATFL on the lateral aspect of ankle distal fibula CFL or PTFL. There is no pain or restriction with ankle or subtalar joint range of motion. There is mild tenderness the ankle joint on palpation. There is trace edema to the ankle with any erythema or increase in warmth. No edema, erythema, increase in warmth to bilateral lower extremities.  No open lesions or pre-ulcerative lesions.  No pain with calf compression, swelling, warmth, erythema  Assessment: Chronic right ankle pain, osteochondral lesion, tibial cyst  Plan: -All treatment options discussed with the patient including all alternatives, risks, complications.  -MRI results were discussed and reviewed with the patient. Given that she has had ongoing symptoms of last 25 years she states that she's had no significant treatment as she states that doctors could not tolerable is going  on. Because of this we will start with conservative treatment start with physical therapy if she is not tried this. A prescription for this was provided today. Discussed with her custom bracing. If this is not helped by discussed the surgical intervention discussed that her possible ATFL repaired ankle arthroscopy. She agrees to this plan. -Follow-up in 6 weeks or sooner if needed. -Patient encouraged to call the office with any questions, concerns, change in symptoms.   Celesta Gentile, DPM

## 2016-07-01 ENCOUNTER — Other Ambulatory Visit: Payer: Self-pay | Admitting: Family Medicine

## 2016-07-04 ENCOUNTER — Telehealth: Payer: Self-pay | Admitting: *Deleted

## 2016-07-04 DIAGNOSIS — M25572 Pain in left ankle and joints of left foot: Principal | ICD-10-CM

## 2016-07-04 DIAGNOSIS — G8929 Other chronic pain: Secondary | ICD-10-CM

## 2016-07-04 NOTE — Telephone Encounter (Signed)
[facility] need rx for pt's PT. Faxed to [facility] [phone].

## 2016-07-31 ENCOUNTER — Ambulatory Visit: Payer: Self-pay | Admitting: Neurology

## 2016-08-07 ENCOUNTER — Ambulatory Visit (INDEPENDENT_AMBULATORY_CARE_PROVIDER_SITE_OTHER): Payer: 59 | Admitting: Podiatry

## 2016-08-07 ENCOUNTER — Encounter: Payer: Self-pay | Admitting: Podiatry

## 2016-08-07 DIAGNOSIS — M25373 Other instability, unspecified ankle: Secondary | ICD-10-CM

## 2016-08-07 DIAGNOSIS — M958 Other specified acquired deformities of musculoskeletal system: Secondary | ICD-10-CM | POA: Diagnosis not present

## 2016-08-08 ENCOUNTER — Ambulatory Visit (INDEPENDENT_AMBULATORY_CARE_PROVIDER_SITE_OTHER): Payer: 59 | Admitting: Neurology

## 2016-08-08 ENCOUNTER — Encounter: Payer: Self-pay | Admitting: Neurology

## 2016-08-08 VITALS — BP 112/76 | HR 97 | Ht 66.0 in | Wt 135.2 lb

## 2016-08-08 DIAGNOSIS — R439 Unspecified disturbances of smell and taste: Secondary | ICD-10-CM | POA: Diagnosis not present

## 2016-08-08 DIAGNOSIS — E236 Other disorders of pituitary gland: Secondary | ICD-10-CM

## 2016-08-08 NOTE — Progress Notes (Signed)
Subjective: 50 year old female presents to the office today for follow-up evaluation of right ankle pain. She states that ankle socks painful per se but she states that when she walks it gives out. Since with physical therapy and has been giving out last. She states it happens about once a week or as part of physical therapy as having several times a week. She states that when it does get she gets pain which moves to different areas of her ankles. She points to the inside, outside, posterior aspect of the ankle when it does hurt. She states is never in the same spot and doesn't travel. Denies any systemic complaints such as fevers, chills, nausea, vomiting. No acute changes since last appointment, and no other complaints at this time.   Objective: AAO x3, NAD DP/PT pulses palpable bilaterally, CRT less than 3 seconds There is currently no tenderness of the right ankle. There is no overlying edema, erythema, increase in warmth today. There is no significant instability the ankle. There is a negative talar tilt test and anterior drawer test. No edema, erythema, increase in warmth to bilateral lower extremities.  No open lesions or pre-ulcerative lesions.  No pain with calf compression, swelling, warmth, erythema  Assessment: Chronic right ankle pain, osteochondral lesion, tibial cyst  Plan: -All treatment options discussed with the patient including all alternatives, risks, complications.  -I again had a long discussion the patient regards to treatment options. At this point she is having improved symptoms with physical therapy will continue this for the next 4 weeks. If in the future discussed with her custom brace versus surgical intervention likely lateral ankle stabilization and ankle arthroscopy however we will hold off on this for now. Follow up after physical therapy or sooner if needed. Call any questions or concerns in the meantime.  Celesta Gentile, DPM

## 2016-08-08 NOTE — Progress Notes (Signed)
[facility]    Provider:  Dr Jaynee Eagles Referring Provider: Carollee Herter, Alferd Apa, * Primary Care Physician:  [name], DO  CC:  Abnormal smells. Left-sided paresthesias  Interval history 08/08/2016: She saw Dr. Ernesto Rutherford and she was treated, she was treated for sinus disease and the abnormal smells resolved. Abnormal smells resolved. No further episodes. Feeling well. No issues. Discussed MRI of the brain, she should have repeat MRI every few years to follow pituitary unless she has symptoms such as vision changes and headaches or anything concerning then needs repeat sooner. F/u one year.  HPI:  [name] is a 50 y.o. female here as a referral from Dr. Carollee Herter for abnormal smells new onset recently and left-sided paresthesias for 6 years. Past medical history anxiety, HLD. She recently has episode of smelling gas/diesel that is not there.  She is under a lot of stress due to her Aunt's AML recently placed into hospice. During an episode of anxiety she smelled diesel and gas, brief, had multiple episodes over 4-6 days. Her aunt is dying, she is having a hard time with it. She has tingling left side of the body usually worse when she is nervous/anxious. She had the abnormal smells off an or for 4 days, would last briefly. Her whole left side is tingly and heavy. Can last briefly, worse with stress. No weakness. No aphasia or dysarthria. She has difficulty concentrating, worse with stress. She has difficulty sleeping. She has insomnia. Sleeping is poor. No head trauma or other inciting events. She is feeling very fatigued, tired, decreased concentration. She is scared about her aunt dying. She is afraid she is having TIAs as well. She smoked for 10 years, she is trying to quit. She has 3 sisters, she handles everything for her Aunt who is like her mother. No headaches, no new vision changes, no other focal neurologic deficits. Alprazolam helps with her symptoms. She  was hospitalized 6 years ago and evaluation for stroke was negative. 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 She had some mild dysfunction. 3 days previously she began to intermittently smelling odor as a mix between chemical and diesel fuel. In the smell last for a maximum of 3 minutes at a time and abruptly disappears. She also has anxiety with tingling in her limbs. But the symptoms are not new for her. She denied any new fevers chills, headache, neurologic deficits, difficulty breathing, nausea vomiting or other complaints. Exam was normal including general exam and neurologic exam. Since there are no indications of neurologic abnormalities or functional deficits aresigns of seizures they diagnosed her with possible anxiety and referral to neurology. No imaging was completed.  Patient was also seen in 2015 and admitted for right facial droop. Imaging showed no stroke. No signs of MS or other etiologies and MRI. No stenosis or significant issues on vascular imaging. No resultant neuro deficits. Was not felt to be a stroke.  CMP normal.  LDL 85.   FINDINGS: MRI HEAD 2015 (personally reviewed images and agree with the following)  No evidence for acute infarction, hemorrhage, mass lesion, hydrocephalus, or extra-axial fluid. No atrophy or white matter disease. Flow voids are maintained throughout the carotid, basilar, and vertebral arteries. There are no areas of chronic hemorrhage. Pituitary, pineal, and cerebellar tonsils unremarkable. No upper cervical  lesions.  Negative orbits, and osseous structures. Chronic bilateral maxillary sinus disease. Chronic bilateral ethmoid sinus disease. 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 Trace left mastoid effusion.  Good  general agreement prior CT.  MRA HEAD FINDINGS  Internal carotid arteries widely patent. Basilar artery widely patent with left greater than right vertebrals contributing. Fetal origin left PCA. 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 No intracranial stenosis or dissection. No visible intracranial aneurysm.  IMPRESSION: No acute or focal intracranial abnormality. Nasopharyngeal Thornwaldt cyst of moderate size, incidental finding. No intracranial flow reducing lesion is evident.    MRI of the brain and MRA of the head and neck May 2012 (personally reviewed images and agree with the following): This MRI of the brain with and without contrast shows the following: 1.   The pituitary gland is enlarged. It appears to enhance homogenously. Dedicated pituitary imaging with MRI and endocrine labs are recommended to better assess for the possibility of a microadenoma. 2.  There is mild age appropriate cortical atrophy and minimal age-appropriate chronic microvascular ischemic change. 3.  There are no acute findings.  MRA HEAD  Findings: Both vertebral arteries are patent to the basilar. PICA and superior cerebellar and posterior cerebral arteries are patent bilaterally without significant stenosis. Hypoplastic left P1 segment with fetal origin of the left posterior cerebral artery. This is a normal variant.  The internal carotid artery is patent bilaterally without stenosis. Anterior and middle cerebral arteries are patent bilaterally.  Negative for aneurysm.  IMPRESSION: Negative  MRA NECK  Findings: Both vertebral arteries are patent to the basilar without stenosis or irregularity.  Both carotid arteries are widely patent without significant stenosis. Negative for aneurysm or dissection.  IMPRESSION: Negative  Carotid Doppler 2015 No evidence of hemodynamically significant internal  carotid artery stenosis. Vertebral artery flow is antegrade  Review of Systems: Patient complains of symptoms per HPI as well as the following symptoms: no CP, no SOB. Pertinent negatives per HPI. All others negative.   Social History   Social History  . Marital status: Single    Spouse name: N/A  . Number of children: 1  . Years of education: 77   Occupational History  . works for [employer]; office specialist    Social History Main Topics  . Smoking status: Current Every Day Smoker    Packs/day: 0.50    Years: 27.00    Types: Cigarettes  . Smokeless tobacco: Never Used  . Alcohol use 0.0 oz/week     Comment: Rare  . Drug use: No  . Sexual activity: Not Currently   Other Topics Concern  . Not on file   Social History Narrative   Marital status: single      Children: one daughter      Lives: with sister      Employment: works for [employer]; office specialist      Education: Multimedia programmer      Tobacco: trying to quit      Alcohol: none; rare      Drugs: none      Exercise: walking daily 2 miles      Caffeine use: Tea/pepsi- 1 pepsi/morning      Tea- 3 cups per day          Family History  Problem Relation Age of Onset  . Stroke Sister 57  after starting OCP  . Hyperlipidemia Sister   . Aortic aneurysm Mother   . Aortic aneurysm    . Hypertension    . Seizures Neg Hx   . Migraines Neg Hx     Past Medical History:  Diagnosis Date  . Breast cancer [facility]) 1999   2004  . Headache   . Hyperlipidemia     Past Surgical History:  Procedure Laterality Date  . ABDOMINAL HYSTERECTOMY  2006   TAH,BSO secondary bleeding and breast cancer--  . MASTECTOMY     bilateral---1999/2004    Current Outpatient Prescriptions  Medication Sig Dispense Refill  . ALPRAZolam (XANAX) 0.25 MG tablet Take 1 tablet (0.25 mg total) by mouth 2 (two) times daily as needed for anxiety. 30 tablet 0  . aspirin 81 MG tablet Take 1 tablet (81  mg total) by mouth daily. 30 tablet   . atorvastatin (LIPITOR) 10 MG tablet Take 1 tablet (10 mg total) by mouth daily at 6 PM. 90 tablet 1  . nicotine polacrilex (NICORETTE) 4 MG gum Take 4 mg by mouth as needed for smoking cessation.     No current facility-administered medications for this visit.     Allergies as of 08/08/2016 - Review Complete 08/08/2016  Allergen Reaction Noted  . Penicillins Other (See Comments) 10/11/2010    Vitals: BP 112/76 (BP Location: Right Arm, Patient Position: Sitting, Cuff Size: Normal)   Pulse 97   Ht 5\' 6"  (1.676 m)   Wt 135 lb 3.2 oz (61.3 kg)   BMI 21.82 kg/m  Last Weight:  Wt Readings from Last 1 Encounters:  08/08/16 135 lb 3.2 oz (61.3 kg)   Last Height:   Ht Readings from Last 1 Encounters:  08/08/16 5\' 6"  (1.676 m)     Physical exam: Exam: Gen: NAD, conversant, well nourised, well groomed                     CV: RRR, no MRG. No Carotid Bruits. No peripheral edema, warm, nontender Eyes: Conjunctivae clear without exudates or hemorrhage  Neuro: Detailed Neurologic Exam  Speech:    Speech is normal; fluent and spontaneous with normal comprehension.  Cognition:    The patient is oriented to person, place, and time;     recent and remote memory intact;     language fluent;     normal attention, concentration,     fund of knowledge Cranial Nerves:    The pupils are equal, round, and reactive to light. The fundi are normal and spontaneous venous pulsations are present. Visual fields are full to finger confrontation. Extraocular movements are intact. Trigeminal sensation is intact and the muscles of mastication are normal. The face is symmetric. The palate elevates in the midline. Hearing intact. Voice is normal. Shoulder shrug is normal. The tongue has normal motion without fasciculations.   Coordination:    Normal finger to nose and heel to shin. Normal rapid alternating movements.   Gait:    Heel-toe and tandem gait are  normal.   Motor Observation:    No asymmetry, no atrophy, and no involuntary movements noted. Tone:    Normal muscle tone.    Posture:    Posture is normal. normal erect    Strength:    Strength is V/V in the upper and lower limbs.      Sensation: intact to LT     Reflex Exam:  DTR's:    Deep tendon reflexes in the upper and lower extremities  are normal bilaterally.   Toes:    The toes are downgoing bilaterally.   Clonus:    Clonus is absent.       Assessment/Plan:  Very lovely 50 year old with left-sided paresthesias and episodes of abnormal smells. She is under a lot of stress. 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   - Abnormal smells resolved with treatment of her sinus disease.  - No more episodes of left-sided tingling - EEG for epileptiform activity. EEG was normal.  - Aspirin 81mg  daily for stroke prevention as she has had facial droopin gin the past and worked up for TIA, however low likelihood these current symptoms represent TIA - LDL 85, needs to follow with pcp and other vascular risk factors such as BP - Will repeat MRI of the brain with and without contrast with thin cuts through the pituitary gland as this was shown to be enlarged in the past. No significant change from 2015. 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 A Tornwaldt's cyst is a benign cyst located in the upper posterior nasopharynx. It is a relatively rare lesion and most are small and asymptomatic whereas some cause nasal obstruction, postnasal drip, occipital headache or eustachian tube dysfunction. She has chronic sinusitis. Referred to ENT and she is currently following with improvement.   Addendum: MRI of the brain: 06/22/2016 :  The brain parenchyma shows no definite abnormalities. On flair images solitary tiny punctate areas of hyperintensity is noted in the left  lateral pons but it is not seen on any other sequences and is of unclear significance. Diffusion-weighted imaging is negative for acute ischemia. Calvarium show no abnormality. Orbits appear unremarkable. Paranasal sinuses show evidence of chronic changes involving bilateral maxillary, ethmoid sinuses mainly and to lesser degree mastoids as well. There is incidental large 1.5 x 1.2 cm Thornwaldt cyst noted in the midline the posterior nasopharynx. The pituitary gland shows slight borderline enlargement of size but no distinct adenoma is noted even on thin sections and contrast administration and coronary dynamic imaging. The flow voids are large vessels of intracranial circulation appear to be patent. Visualized portion of upper cervical spine shows no abnormality. Subarachnoid spaces and ventricular system appear normal.   IMPRESSION: Abnormal MRI scan of the brain showing chronic changes severe maxillary and ethmoid paranasal sinusitis. Incidental large Thornwaldt cyst is noted. Overall no significant change compared with previous MRI scan dated 12/01/2013. The pituitary gland is slightly enlarged but no distinct adenoma is noted   I had a long d/w patient personally independently reviewed imaging studies and evaluation results and answered questions.Continue ASA 81 mg daily and maintain strict control of hypertension with blood pressure goal below 130/90, diabetes with hemoglobin A1c goal below 6.5% and lipids with LDL cholesterol goal below 70 mg/dL. I also advised the patient to eat a healthy diet with plenty of whole grains, cereals, fruits and vegetables, exercise regularly and maintain ideal body weight, stop smoking,. Sarina Ill, MD  [facility] [address] [place], [place] [address]  Phone [phone] Fax [phone]  A total of 30 minutes was spent face-to-face with this patient. Over half this time was spent on counseling patient on the  abnormal smells and paresthesias diagnosis and different diagnostic and therapeutic options available.

## 2016-08-08 NOTE — Patient Instructions (Signed)
Remember to drink plenty of fluid, eat healthy meals and do not skip any meals. Try to eat protein with a every meal and eat a healthy snack such as fruit or nuts in between meals. Try to keep a regular sleep-wake schedule and try to exercise daily, particularly in the form of walking, 20-30 minutes a day, if you can.   I would like to see you back in 1 year, sooner if we need to. Please call us with any interim questions, concerns, problems, updates or refill requests.   Our phone number is [phone]. We also have an after hours call service for urgent matters and there is a physician on-call for urgent questions. For any emergencies you know to call 911 or go to the nearest emergency room   

## 2016-08-10 ENCOUNTER — Encounter: Payer: Self-pay | Admitting: Physician Assistant

## 2016-08-14 ENCOUNTER — Encounter: Payer: Self-pay | Admitting: Physician Assistant

## 2016-08-14 ENCOUNTER — Encounter (INDEPENDENT_AMBULATORY_CARE_PROVIDER_SITE_OTHER): Payer: Self-pay

## 2016-08-14 ENCOUNTER — Ambulatory Visit (INDEPENDENT_AMBULATORY_CARE_PROVIDER_SITE_OTHER): Payer: 59 | Admitting: Physician Assistant

## 2016-08-14 VITALS — BP 134/74 | HR 108 | Ht 66.0 in | Wt 131.4 lb

## 2016-08-14 DIAGNOSIS — R079 Chest pain, unspecified: Secondary | ICD-10-CM | POA: Diagnosis not present

## 2016-08-14 DIAGNOSIS — F17200 Nicotine dependence, unspecified, uncomplicated: Secondary | ICD-10-CM | POA: Diagnosis not present

## 2016-08-14 DIAGNOSIS — R42 Dizziness and giddiness: Secondary | ICD-10-CM | POA: Diagnosis not present

## 2016-08-14 NOTE — Progress Notes (Signed)
Cardiology Office Note    Date:  08/14/2016   ID:  Mary, Valdez 01-16-1966, MRN [id number]  PCP:  [name], DO  Cardiologist: Dr. Irish Lack  Chief Complaint  Patient presents with  . Chest Pain    History of Present Illness:  [name] is a 50 y.o. female who was last seen here 03/2015. She was counseled for smoking cessation also had palpitations that were infrequent and short-lived and no monitor was ordered at that time. She also had a lot of anxiety. 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  She is now smoking 6 cigarettes today. She comes in today thinking all her symptoms are secondary to anxiety. See's primary next week. She has occasional light headedness that goes away quickly.  She thinks her blood pressure might drop and it goes away quickly. She usually drinks water or eat salt. Doesn't happen that often. She walks 3 blocks a day without symptoms. She denies any chest tightness, palpitations, dyspnea, dyspnea on exertion. Sometimes she feels a sensation that something laying a hand on her chest but if she starts talking to somebody it goes away and she forgets about it. She has no exertional symptoms. Her sister had a stroke and she helps take care of her and is very stressed out. She has history of hyperlipidemia and is on 10 mg of Lipitor daily. She also smokes. She has no history of hypertension, diabetes or family history of coronary disease.    Past Medical History:  Diagnosis Date  . Breast cancer [facility]) 1999   2004  . Headache   . Hyperlipidemia     Past Surgical History:  Procedure Laterality Date  . ABDOMINAL HYSTERECTOMY  2006   TAH,BSO secondary bleeding and breast cancer--  . MASTECTOMY     bilateral---1999/2004    Current Medications: Outpatient Medications Prior to Visit  Medication Sig Dispense Refill  . ALPRAZolam (XANAX) 0.25 MG tablet  Take 1 tablet (0.25 mg total) by mouth 2 (two) times daily as needed for anxiety. 30 tablet 0  . aspirin 81 MG tablet Take 1 tablet (81 mg total) by mouth daily. 30 tablet   . atorvastatin (LIPITOR) 10 MG tablet Take 1 tablet (10 mg total) by mouth daily at 6 PM. 90 tablet 1  . nicotine polacrilex (NICORETTE) 4 MG gum Take 4 mg by mouth as needed for smoking cessation.     No facility-administered medications prior to visit.      Allergies:   Penicillins   Social History   Social History  . Marital status: Single    Spouse name: [name]  . Number of children: 1  . Years of education: 58   Occupational History  . works for [employer]; office specialist    Social History Main Topics  . Smoking status: Current Every Day Smoker    Packs/day: 0.50    Years: 27.00    Types: Cigarettes  . Smokeless tobacco: Never Used  . Alcohol use 0.0 oz/week     Comment: Rare  . Drug use: No  . Sexual activity: Not Currently   Other Topics Concern  . None   Social History Narrative   Marital status: single      Children: one daughter      Lives: with sister      Employment: works for [employer]; office specialist  Education: Multimedia programmer      Tobacco: trying to quit      Alcohol: none; rare      Drugs: none      Exercise: walking daily 2 miles      Caffeine use: Tea/pepsi- 1 pepsi/morning      Tea- 3 cups per day           Family History:  The patient's family history includes Aortic aneurysm in her mother; Hyperlipidemia in her sister; Stroke (age of onset: 76) in her sister.   ROS:   Please see the history of present illness.    Review of Systems  Constitution: Negative.  HENT: Negative.   Eyes: Negative.   Cardiovascular: Negative.   Respiratory: Negative.   Hematologic/Lymphatic: Negative.   Musculoskeletal: Negative.  Negative for joint pain.  Gastrointestinal: Negative.   Genitourinary: Negative.   Neurological: Positive for  light-headedness.  Psychiatric/Behavioral: The patient is nervous/anxious.    All other systems reviewed and are negative.   PHYSICAL EXAM:   VS:  BP 134/74 (BP Location: Left Arm, Patient Position: Sitting, Cuff Size: Normal)   Pulse (!) 108   Ht 5\' 6"  (1.676 m)   Wt 131 lb 6.4 oz (59.6 kg)   SpO2 100%   BMI 21.21 kg/m   Physical Exam  GEN: Well nourished, well developed, in no acute distress  Neck: no JVD, carotid bruits, or masses Cardiac:RRR; no murmurs, rubs, or gallops  Respiratory:  clear to auscultation bilaterally, normal work of breathing GI: soft, nontender, nondistended, + BS Ext: without cyanosis, clubbing, or edema, Good distal pulses bilaterally MS: no deformity or atrophy  Skin: warm and dry, no rash Psych: anxious, full affect  Wt Readings from Last 3 Encounters:  08/14/16 131 lb 6.4 oz (59.6 kg)  08/08/16 135 lb 3.2 oz (61.3 kg)  04/03/16 132 lb 9.6 oz (60.1 kg)      Studies/Labs Reviewed:   EKG:  EKG is ordered today.  The ekg ordered today demonstrates   Recent Labs: 10/31/2015: Hemoglobin 14.4; Platelets 246 01/24/2016: ALT 16; BUN 13; Creatinine, Ser 0.97; Potassium 4.4; Sodium 141   Lipid Panel    Component Value Date/Time   CHOL 139 01/24/2016 0853   TRIG 115.0 01/24/2016 0853   HDL 30.70 (L) 01/24/2016 0853   CHOLHDL 5 01/24/2016 0853   VLDL 23.0 01/24/2016 0853   LDLCALC 85 01/24/2016 0853   LDLDIRECT 141.3 07/03/2012 1026    Additional studies/ records that were reviewed today include:  2Decho 12/02/13  Study Conclusions  - Left ventricle: The cavity size was normal. Systolic   function was normal. The estimated ejection fraction was   in the range of 55% to 60%. Wall motion was normal; there   were no regional wall motion abnormalities. Left   ventricular diastolic function parameters were normal. - Atrial septum: A patent foramen ovale cannot be excluded.   There was an atrial septal aneurysm     ASSESSMENT:    1. Chest  pain, unspecified type   2. Lightheaded   3. TOBACCO USE      PLAN:  In order of problems listed above:  Chest pain described more as a sensation of some relieving her hand on her chest but eases if she starts talking to somebody. No EKG changes. She is very anxious. She is a smoker and has hyperlipidemia. Will order GXT. Follow-up if abnormal.  Lightheaded on occasion she thinks it's related to anxiety which I suspect is  the case. Blood pressure is stable here. She will take her blood pressure at home during these episodes. They do not occur often.  Tobacco abuse says she smokes 6 cigarettes a day. Smoking cessation discussed.      Medication Adjustments/Labs and Tests Ordered: Current medicines are reviewed at length with the patient today.  Concerns regarding medicines are outlined above.  Medication changes, Labs and Tests ordered today are listed in the Patient Instructions below. Patient Instructions  Your physician recommends that you continue on your current medications as directed. Please refer to the Current Medication list given to you today. Your physician has requested that you have an exercise tolerance test. For further information please visit [email or web address]. Please also follow instruction sheet, as given.  Your physician recommends that you schedule a follow-up appointment with Dr. Irish Lack if exercise tolerance test is abnormal or you continue to have symptoms.     Sumner Boast, PA-C  08/14/2016 10:57 AM    [facility] [place], [place], [place]  [address] Phone: [phone]; Fax: [phone]

## 2016-08-14 NOTE — Patient Instructions (Signed)
Your physician recommends that you continue on your current medications as directed. Please refer to the Current Medication list given to you today. Your physician has requested that you have an exercise tolerance test. For further information please visit [email or web address]. Please also follow instruction sheet, as given.  Your physician recommends that you schedule a follow-up appointment with Dr. Irish Lack if exercise tolerance test is abnormal or you continue to have symptoms.

## 2016-08-22 ENCOUNTER — Encounter: Payer: Self-pay | Admitting: Family Medicine

## 2016-08-22 ENCOUNTER — Ambulatory Visit (INDEPENDENT_AMBULATORY_CARE_PROVIDER_SITE_OTHER): Payer: 59 | Admitting: Family Medicine

## 2016-08-22 VITALS — BP 118/82 | HR 99 | Temp 97.9°F | Resp 16 | Ht 66.0 in | Wt 133.8 lb

## 2016-08-22 DIAGNOSIS — G4700 Insomnia, unspecified: Secondary | ICD-10-CM

## 2016-08-22 DIAGNOSIS — R42 Dizziness and giddiness: Secondary | ICD-10-CM | POA: Diagnosis not present

## 2016-08-22 LAB — POCT URINALYSIS DIPSTICK
BILIRUBIN UA: NEGATIVE
Blood, UA: NEGATIVE
GLUCOSE UA: NEGATIVE
Ketones, UA: NEGATIVE
LEUKOCYTES UA: NEGATIVE
NITRITE UA: NEGATIVE
Protein, UA: NEGATIVE
Spec Grav, UA: 1.015
Urobilinogen, UA: 0.2
pH, UA: 5.5

## 2016-08-22 MED ORDER — LEVOCETIRIZINE DIHYDROCHLORIDE 5 MG PO TABS: 5.0000 mg | ORAL_TABLET | Freq: Every evening | ORAL | 5 refills | Status: DC

## 2016-08-22 MED ORDER — ALPRAZOLAM 0.25 MG PO TABS: 0.2500 mg | ORAL_TABLET | Freq: Two times a day (BID) | ORAL | 0 refills | Status: DC | PRN

## 2016-08-22 NOTE — Progress Notes (Signed)
Patient ID: [name], female    DOB: 03-11-1966  Age: 50 y.o. MRN: [id number]    Subjective:  Subjective  HPI [name] presents for dizziness x 2 months.  First episode was while she was helping a friend move -- once she ate she felt better.  About 2 weeks ago she had another episode.    Review of Systems  Constitutional: Negative for appetite change, diaphoresis, fatigue and unexpected weight change.  Eyes: Negative for pain, redness and visual disturbance.  Respiratory: Negative for cough, chest tightness, shortness of breath and wheezing.   Cardiovascular: Negative for chest pain, palpitations and leg swelling.  Endocrine: Negative for cold intolerance, heat intolerance, polydipsia, polyphagia and polyuria.  Genitourinary: Negative for difficulty urinating, dysuria and frequency.  Neurological: Positive for dizziness. Negative for light-headedness, numbness and headaches.    History Past Medical History:  Diagnosis Date  . Breast cancer [facility]) 1999   2004  . Headache   . Hyperlipidemia     She has a past surgical history that includes Abdominal hysterectomy (2006) and Mastectomy.   Her family history includes Aortic aneurysm in her mother; Hyperlipidemia in her sister; Stroke (age of onset: 72) in her sister.She reports that she has been smoking Cigarettes.  She has a 13.50 pack-year smoking history. She has never used smokeless tobacco. She reports that she drinks alcohol. She reports that she does not use drugs.  Current Outpatient Prescriptions on File Prior to Visit  Medication Sig Dispense Refill  . aspirin 81 MG tablet Take 1 tablet (81 mg total) by mouth daily. 30 tablet   . atorvastatin (LIPITOR) 10 MG tablet Take 1 tablet (10 mg total) by mouth daily at 6 PM. 90 tablet 1  . nicotine polacrilex (NICORETTE) 4 MG gum Take 4 mg by mouth as needed for smoking cessation.     No current facility-administered medications on file prior to visit.      Objective:    Objective  Physical Exam  Constitutional: She is oriented to person, place, and time. She appears well-developed and well-nourished.  HENT:  Head: Normocephalic and atraumatic.  Eyes: Conjunctivae and EOM are normal.  Neck: Normal range of motion. Neck supple. No JVD present. Carotid bruit is not present. No thyromegaly present.  Cardiovascular: Normal rate, regular rhythm and normal heart sounds.   No murmur heard. Pulmonary/Chest: Effort normal and breath sounds normal. No respiratory distress. She has no wheezes. She has no rales. She exhibits no tenderness.  Musculoskeletal: She exhibits no edema.  Neurological: She is alert and oriented to person, place, and time.  Psychiatric: She has a normal mood and affect. Her behavior is normal. Judgment and thought content normal.  Nursing note and vitals reviewed.  BP 118/82 (BP Location: Right Arm, Patient Position: Sitting, Cuff Size: Normal)   Pulse 99   Temp 97.9 F (36.6 C) (Oral)   Resp 16   Ht 5\' 6"  (1.676 m)   Wt 133 lb 12.8 oz (60.7 kg)   SpO2 98%   BMI 21.60 kg/m  Wt Readings from Last 3 Encounters:  08/22/16 133 lb 12.8 oz (60.7 kg)  08/14/16 131 lb 6.4 oz (59.6 kg)  08/08/16 135 lb 3.2 oz (61.3 kg)     Lab Results  Component Value Date   WBC 6.7 08/22/2016   HGB 14.9 08/22/2016   HCT 44.6 08/22/2016   PLT 232.0 08/22/2016   GLUCOSE 76 08/22/2016   CHOL 139 01/24/2016   TRIG 115.0 01/24/2016  HDL 30.70 (L) 01/24/2016   LDLDIRECT 141.3 07/03/2012   LDLCALC 85 01/24/2016   ALT 11 08/22/2016   AST 14 08/22/2016   NA 138 08/22/2016   K 4.8 08/22/2016   CL 102 08/22/2016   CREATININE 1.00 08/22/2016   BUN 15 08/22/2016   CO2 31 08/22/2016   TSH 1.02 08/22/2016   HGBA1C 5.6 12/02/2013   MICROALBUR <0.7 01/24/2016    Mr Brain W [id number] Contrast  Result Date: 06/23/2016  [facility] [address], [place], [place] [address] [phone] NEUROIMAGING REPORT STUDY DATE: 06/22/2016 PATIENT  NAME: [name] DOB: 1966/06/29 MRN: [id number] ORDERING CLINICIAN: Dr Jaynee Eagles CLINICAL HISTORY:  58 year patient with paresthesias on left side COMPARISON FILMS: MRI Brain 12/01/2013 EXAM: MRI brain w/wo and thin Pituitary sections w/wo TECHNIQUE: MRI of the brain with and without contrast was obtained utilizing 5 mm axial slices with T1, T2, T2 flair, T2 star gradient echo and diffusion weighted views.  T1 sagittal, T2 coronal and postcontrast views in the axial and coronal plane were obtained. Thin 3 mm sagittal, coronal and axial T1 sections were obtained through the pituitary lesion within without contrast in addition. Post contrast coronal dynamic images were obtained as well CONTRAST: 9 ml iv multihance IMAGING SITE: [facility] FINDINGS: The brain parenchyma shows no definite abnormalities. On flair images solitary tiny punctate areas of hyperintensity is noted in the left lateral pons but it is not seen on any other sequences and is of unclear significance. Diffusion-weighted imaging is negative for acute ischemia. Calvarium show no abnormality. Orbits appear unremarkable. Paranasal sinuses show evidence of chronic extremity changes involving bilateral maxillary, ethmoid sinuses mainly and to lesser degree mastoids as well. 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 The pituitary gland shows slight borderline enlargement of size but no distinct adenoma is noted even on thin sections and contrast administration and coronary dynamic imaging. The flow voids are large vessels of intracranial circulation appear to be patent. Visualized portion of upper cervical spine shows no abnormality. Subarachnoid spaces and ventricular system appear normal.    Abnormal MRI scan of the brain showing chronic changes severe maxillary and ethmoid paranasal sinusitis. Incidental large Thornwaldt cyst is noted. Overall no significant change compared with  previous MRI scan dated 12/01/2013. The pituitary gland is slightly enlarged but no distinct adenoma is noted INTERPRETING PHYSICIAN: Antony Contras, MD Certified in  Neuroimaging by [place] [facility] and [place] [facility] Neurological Subspecialities   Mr Ankle Right  Wo Contrast  Result Date: 06/22/2016 CLINICAL DATA:  Right ankle pain instability. History of prior ankle sprains. Right ankle pain. EXAM: MRI OF THE RIGHT ANKLE WITHOUT CONTRAST TECHNIQUE: Multiplanar, multisequence MR imaging of the ankle was performed. No intravenous contrast was administered. COMPARISON:  01/14/2008 FINDINGS: TENDONS Peroneal: Unremarkable Posteromedial: Distal tibialis posterior tenosynovitis and distal tendinopathy. Anterior: Unremarkable Achilles: Unremarkable Plantar Fascia: Unremarkable LIGAMENTS Lateral: Thin/attenuated anterior talofibular ligament. Medial: Unremarkable.  Also the Lisfranc ligament appears intact. 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 This is fairly similar to the appearance on 01/14/2008 at which time there was concern for a Brodie's abscess. Presumably this could also represent an osteochondral lesion of the tibial plafond  with geode, although the degree of marrow edema is fairly notable. The lack of progression seems to argue against this being related to the patient' s prior breast cancer. There is no tibiotalar joint effusion. Small non-fragmented osteochondral lesion of the lateral talar dome on image 14/8. Subtalar Joints/Sinus Tarsi: Unremarkable Bones: No additional significant bony findings. Other: No supplemental non-categorized findings. IMPRESSION: 1. 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 This is fairly similar in appearance to 01/14/2008, at which time there is concern for a Brodie's abscess. The lack of change from that time raises suspicion that this may be a geode. There surrounding marrow edema which may reflect an active process but which was also present in 2009. 2. Small non-fragmented osteochondral lesion of the lateral talar dome. 3. Distal tibialis posterior tenosynovitis and tendinopathy, correlate clinically in assessing for tibialis posterior dysfunction. 4. Attenuated anterior talofibular ligament, probably reflecting a remote injury. Electronically Signed   By: Van Clines M.D.   On: 06/22/2016 09:43     Assessment & Plan:  Plan  I am having [name] start on levocetirizine. I am also having her maintain her aspirin, nicotine polacrilex, atorvastatin, and ALPRAZolam.  Meds ordered this encounter  Medications  . ALPRAZolam (XANAX) 0.25 MG tablet    Sig: Take 1 tablet (0.25 mg total) by mouth 2 (two) times daily as needed for anxiety.    Dispense:  30 tablet    Refill:  0  . levocetirizine (XYZAL) 5 MG tablet    Sig: Take 1 tablet (5 mg total) by mouth every evening.    Dispense:  30 tablet    Refill:  5    Problem List Items Addressed This Visit    None    Visit Diagnoses    Dizziness and giddiness    -  Primary   Relevant Medications   ALPRAZolam (XANAX) 0.25 MG tablet   levocetirizine (XYZAL) 5 MG tablet   Other Relevant Orders   EKG 12-Lead (Completed)   TSH (Completed)   POCT urinalysis dipstick (Completed)   Vitamin B12 (Completed)   CBC with Differential/Platelet (Completed)   Comprehensive metabolic panel (Completed)   Insomnia, unspecified type       Relevant Medications   ALPRAZolam (XANAX) 0.25 MG tablet      Follow-up: Return in about 4 weeks (around 09/19/2016) for anxiety.  [name], DO

## 2016-08-22 NOTE — Patient Instructions (Signed)
Dizziness Dizziness is a common problem. It is a feeling of unsteadiness or light-headedness. You may feel like you are about to faint. Dizziness can lead to injury if you stumble or fall. Anyone can become dizzy, but dizziness is more common in older adults. This condition can be caused by a number of things, including medicines, dehydration, or illness. Follow these instructions at home: Taking these steps may help with your condition: Eating and drinking   Drink enough fluid to keep your urine clear or pale yellow. This helps to keep you from becoming dehydrated. Try to drink more clear fluids, such as water.  Do not drink alcohol.  Limit your caffeine intake if directed by your health care provider.  Limit your salt intake if directed by your health care provider. Activity   Avoid making quick movements.  Rise slowly from chairs and steady yourself until you feel okay.  In the morning, first sit up on the side of the bed. When you feel okay, stand slowly while you hold onto something until you know that your balance is fine.  Move your legs often if you need to stand in one place for a long time. Tighten and relax your muscles in your legs while you are standing.  Do not drive or operate heavy machinery if you feel dizzy.  Avoid bending down if you feel dizzy. Place items in your home so that they are easy for you to reach without leaning over. Lifestyle   Do not use any tobacco products, including cigarettes, chewing tobacco, or electronic cigarettes. If you need help quitting, ask your health care provider.  Try to reduce your stress level, such as with yoga or meditation. Talk with your health care provider if you need help. General instructions   Watch your dizziness for any changes.  Take medicines only as directed by your health care provider. Talk with your health care provider if you think that your dizziness is caused by a medicine that you are taking.  Tell a friend  or a family member that you are feeling dizzy. If he or she notices any changes in your behavior, have this person call your health care provider.  Keep all follow-up visits as directed by your health care provider. This is important. Contact a health care provider if:  Your dizziness does not go away.  Your dizziness or light-headedness gets worse.  You feel nauseous.  You have reduced hearing.  You have new symptoms.  You are unsteady on your feet or you feel like the room is spinning. Get help right away if:  You vomit or have diarrhea and are unable to eat or drink anything.  You have problems talking, walking, swallowing, or using your arms, hands, or legs.  You feel generally weak.  You are not thinking clearly or you have trouble forming sentences. It may take a friend or family member to notice this.  You have chest pain, abdominal pain, shortness of breath, or sweating.  Your vision changes.  You notice any bleeding.  You have a headache.  You have neck pain or a stiff neck.  You have a fever. This information is not intended to replace advice given to you by your health care provider. Make sure you discuss any questions you have with your health care provider. Document Released: 03/21/2001 Document Revised: 03/02/2016 Document Reviewed: 09/21/2014 Elsevier Interactive Patient Education  2017 Elsevier Inc.  

## 2016-08-22 NOTE — Progress Notes (Signed)
Pre visit review using our clinic review tool, if applicable. No additional management support is needed unless otherwise documented below in the visit note. 

## 2016-08-23 ENCOUNTER — Telehealth: Payer: Self-pay | Admitting: Family Medicine

## 2016-08-23 LAB — CBC WITH DIFFERENTIAL/PLATELET
BASOS PCT: 0.5 % (ref 0.0–3.0)
Basophils Absolute: 0 10*3/uL (ref 0.0–0.1)
EOS ABS: 0.6 10*3/uL (ref 0.0–0.7)
EOS PCT: 8.6 % — AB (ref 0.0–5.0)
HEMATOCRIT: 44.6 % (ref 36.0–46.0)
HEMOGLOBIN: 14.9 g/dL (ref 12.0–15.0)
LYMPHS PCT: 41 % (ref 12.0–46.0)
Lymphs Abs: 2.8 10*3/uL (ref 0.7–4.0)
MCHC: 33.4 g/dL (ref 30.0–36.0)
MCV: 89.5 fl (ref 78.0–100.0)
MONOS PCT: 6.2 % (ref 3.0–12.0)
Monocytes Absolute: 0.4 10*3/uL (ref 0.1–1.0)
NEUTROS ABS: 2.9 10*3/uL (ref 1.4–7.7)
Neutrophils Relative %: 43.7 % (ref 43.0–77.0)
PLATELETS: 232 10*3/uL (ref 150.0–400.0)
RBC: 4.98 Mil/uL (ref 3.87–5.11)
RDW: 13.6 % (ref 11.5–15.5)
WBC: 6.7 10*3/uL (ref 4.0–10.5)

## 2016-08-23 LAB — COMPREHENSIVE METABOLIC PANEL
ALBUMIN: 4.6 g/dL (ref 3.5–5.2)
ALT: 11 U/L (ref 0–35)
AST: 14 U/L (ref 0–37)
Alkaline Phosphatase: 105 U/L (ref 39–117)
BUN: 15 mg/dL (ref 6–23)
CALCIUM: 10.1 mg/dL (ref 8.4–10.5)
CHLORIDE: 102 meq/L (ref 96–112)
CO2: 31 meq/L (ref 19–32)
CREATININE: 1 mg/dL (ref 0.40–1.20)
GFR: 75.43 mL/min (ref >60.00)
Glucose, Bld: 76 mg/dL (ref 70–99)
POTASSIUM: 4.8 meq/L (ref 3.5–5.1)
Sodium: 138 mEq/L (ref 135–145)
Total Bilirubin: 0.2 mg/dL (ref 0.2–1.2)
Total Protein: 7.9 g/dL (ref 6.0–8.3)

## 2016-08-23 LAB — VITAMIN B12: Vitamin B-12: 281 pg/mL (ref 211–911)

## 2016-08-23 LAB — TSH: TSH: 1.02 u[IU]/mL (ref 0.35–4.50)

## 2016-08-23 NOTE — Telephone Encounter (Signed)
Caller name: Relationship to patient: Self Can be reached: [phone] Pharmacy:  [facility] [place], [place] [phone] (Phone) [phone] (Fax)     Reason for call: Patient request to have an Epi Pen called in for her to have on standby if she needs it. States she saw provider yesterday and provider is aware of the reaction she had with seafood.

## 2016-08-24 ENCOUNTER — Encounter: Payer: Self-pay | Admitting: Family Medicine

## 2016-08-24 ENCOUNTER — Ambulatory Visit (INDEPENDENT_AMBULATORY_CARE_PROVIDER_SITE_OTHER): Payer: 59

## 2016-08-24 DIAGNOSIS — R079 Chest pain, unspecified: Secondary | ICD-10-CM | POA: Diagnosis not present

## 2016-08-24 LAB — EXERCISE TOLERANCE TEST
CHL CUP MPHR: 170 {beats}/min
CHL CUP RESTING HR STRESS: 109 {beats}/min
CHL RATE OF PERCEIVED EXERTION: 16
Estimated workload: 10.1 METS
Exercise duration (min): 8 min
Exercise duration (sec): 0 s
Peak HR: 184 {beats}/min
Percent HR: 108 %

## 2016-08-24 MED ORDER — EPINEPHRINE 0.3 MG/0.3ML IJ SOAJ: 0.3000 mg | Freq: Once | INTRAMUSCULAR | 0 refills | Status: AC

## 2016-08-24 NOTE — Telephone Encounter (Signed)
Medication filled to pharmacy as requested.   

## 2016-08-24 NOTE — Telephone Encounter (Signed)
Ok to refill epi pen

## 2016-08-28 ENCOUNTER — Encounter: Payer: Self-pay | Admitting: *Deleted

## 2016-09-28 ENCOUNTER — Telehealth: Payer: Self-pay | Admitting: *Deleted

## 2016-09-28 NOTE — Telephone Encounter (Signed)
Drug screen results received and sent to scan.

## 2016-10-19 ENCOUNTER — Telehealth: Payer: Self-pay | Admitting: Family Medicine

## 2016-10-19 ENCOUNTER — Telehealth: Payer: Self-pay | Admitting: Interventional Cardiology

## 2016-10-19 ENCOUNTER — Ambulatory Visit (INDEPENDENT_AMBULATORY_CARE_PROVIDER_SITE_OTHER): Payer: 59 | Admitting: Family Medicine

## 2016-10-19 ENCOUNTER — Encounter: Payer: Self-pay | Admitting: Family Medicine

## 2016-10-19 VITALS — BP 110/60 | HR 99 | Temp 98.1°F | Ht 66.0 in | Wt 132.6 lb

## 2016-10-19 DIAGNOSIS — R42 Dizziness and giddiness: Secondary | ICD-10-CM

## 2016-10-19 DIAGNOSIS — R110 Nausea: Secondary | ICD-10-CM | POA: Diagnosis not present

## 2016-10-19 MED ORDER — ONDANSETRON HCL 4 MG PO TABS: 4.0000 mg | ORAL_TABLET | Freq: Three times a day (TID) | ORAL | 0 refills | Status: DC | PRN

## 2016-10-19 NOTE — Telephone Encounter (Signed)
Patient complaining of lightheadedness, nausea, and diarrhea off and on since Sunday. She states that she has been able to eat and has been drinking plenty of water. She states that when she experiences these symptoms when she gets worked up and her heart starts to race. She states that during the times that her heart is racing her BP is 112/86 and 121/96 and that her HR is 108. She states that she only feels like her heart is racing after she starts thinking about her other symptoms of being lightheaded and nauseous. I explained to the patient the this could be anxiety related. The patient stated that she does have anxiety and did take her medicine this morning. She states that she has an appointment this afternoon with her PCP but would also like to follow up with cardiology. The patient was seen by Ermalinda Barrios, PA-C on 08/14/16. A GXT was ordered. The patient was instructed to follow up with Dr. Irish Lack if the GXT was abnormal or if the patient continued to have symptoms (see 08/14/16 note). The patient had a normal GXT on 08/24/16. Patient was scheduled for an appointment with Dr. Irish Lack on 10/25/16 for continued symptoms.

## 2016-10-19 NOTE — Patient Instructions (Signed)
Excedrin may help with your headache. It sounds like caffeine withdrawal.  If anything changes, let us know or seek care.

## 2016-10-19 NOTE — Telephone Encounter (Signed)
New Message    Pt stated she has nausea , light headedness, her bp is high and she is having heart palpitations, she has appointment with PCP today but would like to speak with you

## 2016-10-19 NOTE — Telephone Encounter (Signed)
Pt called In because she says that she is experiencing nausea, light headedness and tiredness. Pt asked to speak with providers nurse. Providers nurse isnt available. Transferred pt to [facility] for triaging

## 2016-10-19 NOTE — Telephone Encounter (Signed)
Patient Name: [name]  DOB: 02/17/1966    Initial Comment Caller states she has nausea, light headedness, and feeling tired. Not flu, been going on for a while.    Nurse Assessment  Nurse: Raphael Gibney, RN, Vanita Ingles Date/Time (Eastern Time): 10/19/2016 9:43:14 AM  Confirm and document reason for call. If symptomatic, describe symptoms. ---Caller states she has fatigue. she is lightheaded and nausea. No cough or congestion. Has had symptoms since Sunday. Had a loose stool this am. No vomiting. Pulse 96. BP 114/85.  Does the patient have any new or worsening symptoms? ---Yes  Will a triage be completed? ---Yes  Related visit to physician within the last 2 weeks? ---No  Does the PT have any chronic conditions? (i.e. diabetes, asthma, etc.) ---No  Is the patient pregnant or possibly pregnant? (Ask all females between the ages of 52-55) ---No  Is this a behavioral health or substance abuse call? ---No     Guidelines    Guideline Title Affirmed Question Affirmed Notes  Dizziness - Lightheadedness [1] MILD dizziness (e.g., walking normally) AND [2] has NOT been evaluated by physician for this (Exception: dizziness caused by heat exposure, sudden standing, or poor fluid intake)    Final Disposition User   See PCP When Office is Open (within 3 days) Raphael Gibney, RN, Vanita Ingles    Comments  pt wants appt today. Dr. Etter Sjogren does not have any appts today that pt can be at the office by. Scheduled appt with Dr. Riki Sheer on 10/19/16 at 2:15 pm.   Referrals  REFERRED TO PCP OFFICE   Disagree/Comply: Comply

## 2016-10-19 NOTE — Progress Notes (Signed)
Chief Complaint  Patient presents with  . Dizziness    and nausea and tried-sxs since Sunday (off and on)    [name] is 51 y.o. pt here for dizziness.  Duration: 4 days Pass out? No Spinning? No Recent illness/fever? No Headache? No Neurologic signs? No Change in PO intake? No  Usually lasts around 20 min when she is sitting down. No palliation or provocation. She was seen for this before and nothing came of the workup from neurology or ENT. Xanax had no noticeable effect. Associated nausea. HA from lack of caffeine intake. No vomiting, abdominal pain, decreased appetite, diarrhea, numbness, tingling, weakness, vision changes, fevers.  ROS:  Neuro: As noted in HPI Eyes: No vision changes  Past Medical History:  Diagnosis Date  . Breast cancer [facility]) 1999   2004  . Headache   . Hyperlipidemia     Family History  Problem Relation Age of Onset  . Stroke Sister 74    after starting OCP  . Hyperlipidemia Sister   . Aortic aneurysm Mother   . Aortic aneurysm    . Hypertension    . Seizures Neg Hx   . Migraines Neg Hx     Allergies as of 10/19/2016      Reactions   Penicillins Other (See Comments)   Childhood reaction      Medication List       Accurate as of 10/19/16  3:10 PM. Always use your most recent med list.          ALPRAZolam 0.25 MG tablet Commonly known as:  XANAX Take 1 tablet (0.25 mg total) by mouth 2 (two) times daily as needed for anxiety.   aspirin 81 MG tablet Take 1 tablet (81 mg total) by mouth daily.   atorvastatin 10 MG tablet Commonly known as:  LIPITOR Take 1 tablet (10 mg total) by mouth daily at 6 PM.   levocetirizine 5 MG tablet Commonly known as:  XYZAL Take 1 tablet (5 mg total) by mouth every evening.   nicotine polacrilex 4 MG gum Commonly known as:  NICORETTE Take 4 mg by mouth as needed for smoking cessation.   ondansetron 4 MG tablet Commonly known as:  ZOFRAN Take 1 tablet (4 mg total) by mouth every 8  (eight) hours as needed for nausea or vomiting.       BP 110/60 (BP Location: Right Arm, Patient Position: Sitting, Cuff Size: Normal)   Pulse 99   Temp 98.1 F (36.7 C) (Oral)   Ht 5\' 6"  (1.676 m)   Wt 132 lb 9.6 oz (60.1 kg)   SpO2 98%   BMI 21.40 kg/m  General: Awake, alert, appears stated age Eyes: PERRLA, EOMi Ears: Patent, TM's neg b/l Heart: RRR, no murmurs, no carotid bruits Lungs: CTAB, no accessory muscle use MSK: 5/5 strength throughout, gait normal Neuro: No cerebellar signs, patellar reflex 0/4 b/l wo clonus, calcaneal reflex 1/4 b/l wo clonus, biceps reflex 2/4 b/l wo clonus; Dix-Hall-Pike negative. Psych: Age appropriate judgment and insight, normal mood and affect  Light-headedness  Nausea without vomiting - Plan: ondansetron (ZOFRAN) 4 MG tablet  Orders as above. I do not see any red flag signs. I do not know what additional benefit obtaining labs would be at this time given her hx of the same event in past. Reassured patient for now. Continue to push fluids. Zofran for nausea, could be having decreased PO intake and not noticing.  She is going to see her cardiologist next  week. F/u prn. Pt voiced understanding and agreement to the plan.  [place]any [place], DO 10/19/16 3:10 PM

## 2016-10-20 NOTE — Telephone Encounter (Signed)
Pt saw Dr. Nani Ravens yesterday (10/19/16).

## 2016-10-25 ENCOUNTER — Ambulatory Visit: Payer: 59 | Admitting: Interventional Cardiology

## 2016-10-25 NOTE — Progress Notes (Signed)
Cardiology Office Note    Date:  10/27/2016   ID:  [name], DOB May 10, 1966, MRN [id number]  PCP:  [name], DO  Cardiologist:  Dr. Irish Lack   CC: follow up  History of Present Illness:  [name] is a 51 y.o. female with a history of breast cancer s/p bilateral mastectomy (2004), HLD, anxiety, tobacco abuse and chest pain/palitations who presents to clinic for follow up.  She was seen in the office in 03/2015 for palpitations. 2D ECHO in 2015 showed normal LV function with atrial septal anerysm (PFO could not be excluded). This was done during a stroke work up in 2015 that was later felt most likely 2/2 anxiety attack.   She was recently seen by Estella Husk PA-C in the office in 08/2016 for chest pain. Her chest pain was felt to be abnormal but since she had RFs of HLD and tobacco abuse a GXT was recommended. This was completed on 08/24/16 and negative for ischemia with excellent exercise capacity.   She called the office on 10/19/16 to report lightheadedness, dizziness and diarrhea as well as tachycardia. She was added onto my schedule. Saw PCP same day who prescribed zofran and reassurance.    Today she presents to clinic for follow up. She has not had anymore nausea. Just worried and wanted to come in for some reassurance. She was worried mostly because her SBP was low at 108 and very mildly dizzy. No chest pain or SOB. 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 No exertional CP or SOB. No LE edema, orthopnea or PND. No more dizziness or syncope. No blood in stool or urine. No palpitations. She quit smoking about 4 months ago and has been using the niotine lozenges and gum. She takes care of her sister who had a CVA which does stress her out some. She admits she often unnecessarily worries about her health.     Past Medical History:  Diagnosis Date  . Breast cancer [facility]) 1999   2004  . Headache   . Hyperlipidemia     Past Surgical History:    Procedure Laterality Date  . ABDOMINAL HYSTERECTOMY  2006   TAH,BSO secondary bleeding and breast cancer--  . MASTECTOMY     bilateral---1999/2004    Current Medications: Outpatient Medications Prior to Visit  Medication Sig Dispense Refill  . ALPRAZolam (XANAX) 0.25 MG tablet Take 1 tablet (0.25 mg total) by mouth 2 (two) times daily as needed for anxiety. 30 tablet 0  . aspirin 81 MG tablet Take 1 tablet (81 mg total) by mouth daily. 30 tablet   . atorvastatin (LIPITOR) 10 MG tablet Take 1 tablet (10 mg total) by mouth daily at 6 PM. 90 tablet 1  . nicotine polacrilex (NICORETTE) 4 MG gum Take 4 mg by mouth as needed for smoking cessation.    Marland Kitchen levocetirizine (XYZAL) 5 MG tablet Take 1 tablet (5 mg total) by mouth every evening. (Patient not taking: Reported on 10/27/2016) 30 tablet 5  . ondansetron (ZOFRAN) 4 MG tablet Take 1 tablet (4 mg total) by mouth every 8 (eight) hours as needed for nausea or vomiting. (Patient not taking: Reported on 10/27/2016) 20 tablet 0   No facility-administered medications prior to visit.      Allergies:   Penicillins   Social History   Social History  . Marital status: Single    Spouse name: N/A  . Number of children: 1  .  Years of education: 56   Occupational History  . works for [employer]; office specialist    Social History Main Topics  . Smoking status: Current Every Day Smoker    Packs/day: 0.50    Years: 27.00    Types: Cigarettes  . Smokeless tobacco: Never Used  . Alcohol use 0.0 oz/week     Comment: Rare  . Drug use: No  . Sexual activity: Not Currently   Other Topics Concern  . None   Social History Narrative   Marital status: single      Children: one daughter      Lives: with sister      Employment: works for [employer]; office specialist      Education: Multimedia programmer      Tobacco: trying to quit      Alcohol: none; rare      Drugs: none      Exercise: walking daily 2 miles       Caffeine use: Tea/pepsi- 1 pepsi/morning      Tea- 3 cups per day           Family History:  The patient's family history includes Aortic aneurysm in her mother; Hyperlipidemia in her sister; Stroke (age of onset: 23) in her sister.      ROS:   Please see the history of present illness.    ROS All other systems reviewed and are negative.   PHYSICAL EXAM:   VS:  BP 114/70   Pulse 100   Ht 5\' 6"  (1.676 m)   Wt 135 lb 12.8 oz (61.6 kg)   SpO2 95%   BMI 21.92 kg/m    GEN: Well nourished, well developed, in no acute distress  HEENT: normal  Neck: no JVD, carotid bruits, or masses Cardiac: RRR; no murmurs, rubs, or gallops,no edema  Respiratory:  clear to auscultation bilaterally, normal work of breathing GI: soft, nontender, nondistended, + BS MS: no deformity or atrophy  Skin: warm and dry, no rash Neuro:  Alert and Oriented x 3, Strength and sensation are intact Psych: euthymic mood, full affect    Wt Readings from Last 3 Encounters:  10/27/16 135 lb 12.8 oz (61.6 kg)  10/19/16 132 lb 9.6 oz (60.1 kg)  08/22/16 133 lb 12.8 oz (60.7 kg)      Studies/Labs Reviewed:   EKG:  EKG is ordered today.  The ekg ordered today demonstrates NSR HR 100  Recent Labs: 08/22/2016: ALT 11; BUN 15; Creatinine, Ser 1.00; Hemoglobin 14.9; Platelets 232.0; Potassium 4.8; Sodium 138; TSH 1.02   Lipid Panel    Component Value Date/Time   CHOL 139 01/24/2016 0853   TRIG 115.0 01/24/2016 0853   HDL 30.70 (L) 01/24/2016 0853   CHOLHDL 5 01/24/2016 0853   VLDL 23.0 01/24/2016 0853   LDLCALC 85 01/24/2016 0853   LDLDIRECT 141.3 07/03/2012 1026    Additional studies/ records that were reviewed today include:  2D ECHO: 12/02/2013 LV EF: 55% -  60% Study Conclusions - Left ventricle: The cavity size was normal. Systolic function was normal. The estimated ejection fraction was in the range of 55% to 60%. Wall motion was normal; there were no regional wall motion  abnormalities. Left ventricular diastolic function parameters were normal. - Atrial septum: A patent foramen ovale cannot be excluded. There was an atrial septal aneurysm.   GXT 08/24/16 Study Highlights   Blood pressure demonstrated a normal response to exercise.  There was no ST segment  deviation noted during stress.  Clinically and electrically negative for ischemia Excellent exercise tolerance.        ASSESSMENT & PLAN:    Nausea and dizziness: this has resolved. Could be related to nicotine gum/losenges. Otherwise provided reassurance that everything looks good and that she is healthy.  HLD: continue atorvastatin, followed by PCP  Tobacco abuse: she has quit x4 months, congratulated her on this.   Chest pain/palpitations: she denies any of these symptoms at this time. Recent reassuring GXT  Medication Adjustments/Labs and Tests Ordered: Current medicines are reviewed at length with the patient today.  Concerns regarding medicines are outlined above.  Medication changes, Labs and Tests ordered today are listed in the Patient Instructions below. Patient Instructions  Medication Instructions:  None ordered Labwork: None ordered  Testing/Procedures: None ordered  Follow-Up: Your physician recommends that you schedule a follow-up appointment in: AS NEEDED   Any Other Special Instructions Will Be Listed Below (If Applicable).     If you need a refill on your cardiac medications before your next appointment, please call your pharmacy.      Signed, Angelena Form, PA-C  10/27/2016 12:10 PM    [facility] [place], [place], [place]  [address] Phone: [phone]; Fax: [phone]

## 2016-10-27 ENCOUNTER — Ambulatory Visit (INDEPENDENT_AMBULATORY_CARE_PROVIDER_SITE_OTHER): Payer: 59 | Admitting: Physician Assistant

## 2016-10-27 ENCOUNTER — Encounter: Payer: Self-pay | Admitting: Physician Assistant

## 2016-10-27 ENCOUNTER — Encounter (INDEPENDENT_AMBULATORY_CARE_PROVIDER_SITE_OTHER): Payer: Self-pay

## 2016-10-27 VITALS — BP 114/70 | HR 100 | Ht 66.0 in | Wt 135.8 lb

## 2016-10-27 DIAGNOSIS — R079 Chest pain, unspecified: Secondary | ICD-10-CM

## 2016-10-27 DIAGNOSIS — F17200 Nicotine dependence, unspecified, uncomplicated: Secondary | ICD-10-CM | POA: Diagnosis not present

## 2016-10-27 DIAGNOSIS — R002 Palpitations: Secondary | ICD-10-CM

## 2016-10-27 DIAGNOSIS — E785 Hyperlipidemia, unspecified: Secondary | ICD-10-CM

## 2016-10-27 DIAGNOSIS — R42 Dizziness and giddiness: Secondary | ICD-10-CM | POA: Diagnosis not present

## 2016-10-27 NOTE — Patient Instructions (Signed)
Medication Instructions:  None ordered  Labwork: None ordered  Testing/Procedures: None ordered  Follow-Up: Your physician recommends that you schedule a follow-up appointment in: AS NEEDED   Any Other Special Instructions Will Be Listed Below (If Applicable).     If you need a refill on your cardiac medications before your next appointment, please call your pharmacy.   

## 2016-11-06 ENCOUNTER — Telehealth: Payer: Self-pay | Admitting: Neurology

## 2016-11-06 ENCOUNTER — Other Ambulatory Visit: Payer: Self-pay | Admitting: Family Medicine

## 2016-11-06 DIAGNOSIS — G4700 Insomnia, unspecified: Secondary | ICD-10-CM

## 2016-11-06 NOTE — Telephone Encounter (Signed)
Patient is calling in reference to left sided numbness, light headed/dizziness from left knee down.  Patient states she has been to ENT, PCP and has been advised it is stress, but patient isn't sure that's it.  Patient states these spells come and go.  Please call

## 2016-11-06 NOTE — Telephone Encounter (Signed)
Called and spoke to pt. Symptoms are intermittent. She has also been evaluated by heart doctor. Polyp was found in nose by ENT. Pt not sure which side. She stopped drinking soda. She has also been doing nicorette gum. They thought she was going through caffeine withdrawal.Symptoms do not happen everyday, and do not last all day.  She gets sick to her stomach sometimes. But once this feels better, her head feels better. She does not believe this is anxiety related. I scheduled f/u with AA,MD on 11/20/16 at 830am, check in 800am.  Advised I will send to AA,MD to make sure this is ok.  She is ok to come see NP sooner or go to different physician if need be. Advised I will call back tomorrow to advise. She verbalized understanding.

## 2016-11-06 NOTE — Telephone Encounter (Signed)
Follow up on the 12th is fine thanks

## 2016-11-07 ENCOUNTER — Other Ambulatory Visit: Payer: Self-pay | Admitting: Family Medicine

## 2016-11-07 DIAGNOSIS — F411 Generalized anxiety disorder: Secondary | ICD-10-CM

## 2016-11-07 NOTE — Telephone Encounter (Signed)
Requesting:   Alprazolam Contract    08/22/2016 UDS   NONE Last OV    08/22/2016 No upcoming scheduled OV Last Refill   #30 with 0 refills on 08/22/2016  Please Advise

## 2016-11-07 NOTE — Telephone Encounter (Signed)
Faxed hardcopy for alprazolam to [facility] [place] 

## 2016-11-07 NOTE — Telephone Encounter (Signed)
Called and spoke to pt. Advised her to keep f/u we made for 11/20/16. She stated she called PCP last night and she took xanax 0.25 mg tablet last night for anxiety. She said her symptoms went away and she is feeling better. She would still like to f/u with Dr Jaynee Eagles to make sure.

## 2016-11-20 ENCOUNTER — Encounter: Payer: Self-pay | Admitting: Neurology

## 2016-11-20 ENCOUNTER — Ambulatory Visit (INDEPENDENT_AMBULATORY_CARE_PROVIDER_SITE_OTHER): Payer: 59 | Admitting: Neurology

## 2016-11-20 VITALS — BP 118/84 | HR 104 | Ht 66.0 in | Wt 135.4 lb

## 2016-11-20 DIAGNOSIS — F419 Anxiety disorder, unspecified: Secondary | ICD-10-CM | POA: Diagnosis not present

## 2016-11-20 DIAGNOSIS — IMO0001 Reserved for inherently not codable concepts without codable children: Secondary | ICD-10-CM

## 2016-11-20 DIAGNOSIS — R209 Unspecified disturbances of skin sensation: Secondary | ICD-10-CM | POA: Diagnosis not present

## 2016-11-20 NOTE — Patient Instructions (Signed)
Remember to drink plenty of fluid, eat healthy meals and do not skip any meals. Try to eat protein with a every meal and eat a healthy snack such as fruit or nuts in between meals. Try to keep a regular sleep-wake schedule and try to exercise daily, particularly in the form of walking, 20-30 minutes a day, if you can.   As far as your medications are concerned, I would like to suggest: Start Lexapro (escitalopram), Xanax as needed  I would like to see you back as needed, sooner if we need to. Please call us with any interim questions, concerns, problems, updates or refill requests.   Our phone number is [phone]. We also have an after hours call service for urgent matters and there is a physician on-call for urgent questions. For any emergencies you know to call 911 or go to the nearest emergency room

## 2016-11-20 NOTE — Progress Notes (Signed)
[facility]    Provider:  Dr Jaynee Eagles Referring Provider: Carollee Herter, Alferd Apa, * Primary Care Physician:  [name], DO  CC: Left-sided paresthesias  Interval history 11/20/2016: She was started on Lexapro and xanax for anxiety and levocetirizine for insomnia - she has not started the Lexapro and I encouraged her to start it for her anxiety which may be the cause of her symptoms or at least exacerbating them. She still has left-sided numbness which makes her even more anxious. 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 She has stopped caffeine. She denies headaches. Routine EEG was normal. She had a normal stroke workup in 2015 for facial droop.   Today she says if she gets sick or anxious she has left-sided tingling. She has had episodes of feeling sick to her stomach and dizziness, left back of her head numb and numb in the should and calf or numb in the left hand and foot. Also numbness on the left side of her face. No other issues. No weakness. No focal deficits. No headache. Lightheaded during the episodes. Not breathing hard but she did start stressing. She drank some caffeine which resolved the symptoms, she was told she wass going through caffeine withdrawal. It still makes her significantly anxious. Xanax will also resolve the tingling.   Interval history 08/08/2016: She saw Dr. Ernesto Rutherford and she was treated, she was treated for sinus disease and the abnormal smells resolved. Abnormal smells resolved. No further episodes. Feeling well. No issues. Discussed MRI of the brain, she should have repeat MRI every few years to follow pituitary unless she has symptoms such as vision changes and headaches or anything concerning then needs repeat sooner. F/u one  year.  [id number] L Jonesis a 51 y.o.femalehere as a referral from Dr. Marlyne Beards abnormal smells new onset recently and left-sided paresthesias for 6 years. Past medical history anxiety, HLD. She recently has episode of smelling gas/diesel that is not there. She is under a lot of stress due to her Aunt's AML recently placed into hospice. During an episode of anxiety she smelled diesel and gas, brief, had multiple episodes over 4-6 days. Her aunt is dying, she is having a hard time with it. She has tingling left side of the body usually worse when she is nervous/anxious. She had the abnormal smells off an or for 4 days, would last briefly. Her whole left side is tingly and heavy. Can last briefly, worse with stress. No weakness. No aphasia or dysarthria. She has difficulty concentrating, worse with stress. She has difficulty sleeping. She has insomnia. Sleeping is poor. No head trauma or other inciting events. She is feeling very fatigued, tired, decreased concentration. She is scared about her aunt dying. She is afraid she is having TIAs as well. She smoked for 10 years, she is trying to quit. She has 3 sisters, she handles everything for her Aunt who is like her mother. No headaches, no new vision changes, no other focal neurologic deficits. Alprazolam helps with her symptoms. She was hospitalized 6 years ago and evaluation for stroke was negative. 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 She had some  mild dysfunction. 3 days previously she began to intermittently smelling odor as a mix between chemical and diesel fuel. In the smell last for a maximum of 3 minutes at a time and abruptly disappears. She also has anxiety with tingling in her limbs. But the symptoms are not new for her. She denied any new fevers chills,  headache, neurologic deficits, difficulty breathing, nausea vomiting or other complaints.Exam was normal including general exam and neurologic exam. Since there are no indications of neurologic abnormalities or functional deficits aresigns of seizures they diagnosed her with possible anxiety and referral to neurology. No imaging was completed.  Patient was also seen in 2015 and admitted for right facial droop. Imaging showed no stroke. No signs of MS or other etiologies and MRI. No stenosis or significant issues on vascular imaging. No resultant neuro deficits. Was not felt to be a stroke.  CMP normal.  LDL 85.   FINDINGS: MRI HEAD 2015 (personally reviewed images and agree with the following)  No evidence for acute infarction, hemorrhage, mass lesion, hydrocephalus, or extra-axial fluid. No atrophy or white matter disease. Flow voids are maintained throughout the carotid, basilar, and vertebral arteries. There are no areas of chronic hemorrhage. Pituitary, pineal, and cerebellar tonsils unremarkable. No upper cervical lesions.  Negative orbits, and osseous structures. Chronic bilateral maxillary sinus disease. Chronic bilateral ethmoid sinus disease. 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 Trace left mastoid effusion.  Good general agreement prior CT.  MRA HEAD FINDINGS  Internal carotid arteries widely patent. Basilar artery widely patent with left greater than right vertebrals contributing. Fetal origin left PCA. 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 No intracranial stenosis or dissection. No visible intracranial aneurysm.  IMPRESSION: No acute or focal intracranial abnormality. Nasopharyngeal Thornwaldt cyst of moderate size, incidental finding. No intracranial flow reducing lesion is evident.  Social History   Social History  . Marital  status: Single    Spouse name: N/A  . Number of children: 1  . Years of education: 33   Occupational History  . works for [employer]; office specialist    Social History Main Topics  . Smoking status: Current Every Day Smoker    Packs/day: 0.50    Years: 27.00    Types: Cigarettes  . Smokeless tobacco: Never Used  . Alcohol use 0.0 oz/week     Comment: Rare  . Drug use: No  . Sexual activity: Not Currently   Other Topics Concern  . Not on file   Social History Narrative   Marital status: single      Children: one daughter      Lives: with sister      Employment: works for [employer]; office specialist      Education: Multimedia programmer      Tobacco: trying to quit      Alcohol: none; rare      Drugs: none      Exercise: walking daily 2 miles      Caffeine use: Tea/pepsi- 1 pepsi/morning      Tea- 3 cups per day          Family History  Problem Relation Age of Onset  . Stroke Sister 52    after starting OCP  . Hyperlipidemia Sister   . Aortic aneurysm Mother   . Aortic aneurysm    . Hypertension    .  Seizures Neg Hx   . Migraines Neg Hx     Past Medical History:  Diagnosis Date  . Breast cancer [facility]) 1999   2004  . Headache   . Hyperlipidemia     Past Surgical History:  Procedure Laterality Date  . ABDOMINAL HYSTERECTOMY  2006   TAH,BSO secondary bleeding and breast cancer--  . MASTECTOMY     bilateral---1999/2004    Current Outpatient Prescriptions  Medication Sig Dispense Refill  . ALPRAZolam (XANAX) 0.25 MG tablet TAKE 1 TABLET BY MOUTH TWICE DAILY AS NEEDED FOR ANXIETY 30 tablet 0  . aspirin 81 MG tablet Take 1 tablet (81 mg total) by mouth daily. 30 tablet   . atorvastatin (LIPITOR) 10 MG tablet Take 1 tablet (10 mg total) by mouth daily at 6 PM. 90 tablet 1  . escitalopram (LEXAPRO) 10 MG tablet TAKE 1 TABLET(10 MG) BY MOUTH DAILY 90 tablet 0  . nicotine polacrilex (NICORETTE) 4 MG gum Take 4 mg by mouth as  needed for smoking cessation.     No current facility-administered medications for this visit.     Allergies as of 11/20/2016 - Review Complete 10/27/2016  Allergen Reaction Noted  . Penicillins Other (See Comments) 10/11/2010    Vitals: There were no vitals taken for this visit. Last Weight:  Wt Readings from Last 1 Encounters:  10/27/16 135 lb 12.8 oz (61.6 kg)   Last Height:   Ht Readings from Last 1 Encounters:  10/27/16 5\' 6"  (1.676 m)   Physical exam: Exam: Gen: NAD, conversant, well nourised, obese, well groomed                     CV: RRR, no MRG. No Carotid Bruits. No peripheral edema, warm, nontender Eyes: Conjunctivae clear without exudates or hemorrhage  Neuro: Detailed Neurologic Exam  Speech:    Speech is normal; fluent and spontaneous with normal comprehension.  Cognition:    The patient is oriented to person, place, and time;     recent and remote memory intact;     language fluent;     normal attention, concentration,     fund of knowledge Cranial Nerves:    The pupils are equal, round, and reactive to light. The fundi are normal and spontaneous venous pulsations are present. Visual fields are full to finger confrontation. Extraocular movements are intact. Trigeminal sensation is intact and the muscles of mastication are normal. The face is symmetric. The palate elevates in the midline. Hearing intact. Voice is normal. Shoulder shrug is normal. The tongue has normal motion without fasciculations.   Coordination:    Normal finger to nose and heel to shin. Normal rapid alternating movements.   Gait:    Heel-toe and tandem gait are normal.   Motor Observation:    No asymmetry, no atrophy, and no involuntary movements noted. Tone:    Normal muscle tone.    Posture:    Posture is normal. normal erect    Strength:    Strength is V/V in the upper and lower limbs.      Sensation: intact to LT     Reflex Exam:  DTR's:    Deep tendon reflexes in  the upper and lower extremities are normal bilaterally.   Toes:    The toes are downgoing bilaterally.   Clonus:    Clonus is absent.   Assessment/Plan:Very lovely 51 year old with left-sided paresthesias in the setting of anxiety. She is under a lot of stress. 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   - Abnormal smells resolved with treatment of her sinus disease.  - left-sided tingling: resolved with xanax so likely due to anxiety I encouraged her to start the Lexapro - EEG for epileptiform activity. EEG was normal.  - Aspirin 81mg  daily for stroke prevention as she has had facial droopin gin the past and worked up for TIA, however low likelihood these current symptoms represent TIA - LDL 85, needs to follow with pcp and other vascular risk factors such as BP - Will repeat MRI of the brain with and without contrast with thin cuts through the pituitary gland as this was shown to be enlarged in the past. No significant change from 2015. 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 A Tornwaldt's cyst is a benign cyst located in the upper posterior nasopharynx. It is a relatively rare lesion and most are small and asymptomatic whereas some cause nasal obstruction, postnasal drip, occipital headache or eustachian tube dysfunction. She has chronic sinusitis. Referred to ENT and she is currently following with improvement.   Addendum: MRI of the brain: 06/22/2016 :  The brain parenchyma shows no definite abnormalities. On flair images solitary tiny punctate areas of hyperintensity is noted in the left lateral pons but it is not seen on any other sequences and is of unclear significance. Diffusion-weighted imaging is negative for acute ischemia. Calvarium show no abnormality. Orbits appear unremarkable. Paranasal sinuses show evidence of chronic changes involving bilateral  maxillary, ethmoid sinuses mainly and to lesser degree mastoids as well. There is incidental large 1.5 x 1.2 cm Thornwaldt cyst noted in the midline the posterior nasopharynx.The pituitary gland shows slight borderline enlargement of size but no distinct adenoma is noted even on thin sections and contrast administration and coronary dynamic imaging. The flow voids are large vessels of intracranial circulation appear to be patent. Visualized portion of upper cervical spine shows no abnormality. Subarachnoid spaces and ventricular system appear normal.   IMPRESSION: Abnormal MRI scan of the brain showing chronic changes severe maxillary and ethmoid paranasal sinusitis. Incidental large Thornwaldt cyst is noted. Overall no significant change compared with previous MRI scan dated 12/01/2013. The pituitary gland is slightly enlarged but no distinct adenoma is noted   I had a long d/w patient personally independently reviewed imaging studies and evaluation results and answered questions.Continue ASA 81 mg daily and maintain strict control of hypertension with blood pressure goal below 130/90, diabetes with hemoglobin A1c goal below 6.5% and lipids with LDL cholesterol goal below 70 mg/dL. I also advised the patient to eat a healthy diet with plenty of whole grains, cereals, fruits and vegetables, exercise regularly and maintain ideal body weight, stop smoking,. Sarina Ill, MD  [facility] [address] [place], [place] [address]  Phone [phone] Fax [phone]  A total of 25 minutes was spent face-to-face with this patient. Over half this time was spent on counseling patient on the left-sided paresthesias and anxiety diagnosis and different diagnostic and therapeutic options available.

## 2016-11-21 DIAGNOSIS — H18823 Corneal disorder due to contact lens, bilateral: Secondary | ICD-10-CM | POA: Diagnosis not present

## 2016-11-28 DIAGNOSIS — Z947 Corneal transplant status: Secondary | ICD-10-CM | POA: Diagnosis not present

## 2016-11-28 DIAGNOSIS — H04123 Dry eye syndrome of bilateral lacrimal glands: Secondary | ICD-10-CM | POA: Diagnosis not present

## 2016-11-28 DIAGNOSIS — H18453 Nodular corneal degeneration, bilateral: Secondary | ICD-10-CM | POA: Diagnosis not present

## 2016-12-12 ENCOUNTER — Other Ambulatory Visit: Payer: Self-pay | Admitting: Family Medicine

## 2016-12-12 DIAGNOSIS — G4700 Insomnia, unspecified: Secondary | ICD-10-CM

## 2016-12-12 NOTE — Telephone Encounter (Signed)
Faxed hardcopy for Alprazolam to [facility] [place]

## 2016-12-12 NOTE — Telephone Encounter (Signed)
Last alprazolam refill was on 11/07/2016 with #30 with 0 refills Last office visit 08/22/2016---no future appt. No UDS/ No Contract.

## 2017-01-05 ENCOUNTER — Other Ambulatory Visit: Payer: Self-pay | Admitting: Family Medicine

## 2017-01-29 DIAGNOSIS — Z947 Corneal transplant status: Secondary | ICD-10-CM | POA: Diagnosis not present

## 2017-01-29 DIAGNOSIS — H04123 Dry eye syndrome of bilateral lacrimal glands: Secondary | ICD-10-CM | POA: Diagnosis not present

## 2017-02-12 ENCOUNTER — Other Ambulatory Visit: Payer: Self-pay | Admitting: Family Medicine

## 2017-03-28 DIAGNOSIS — Z947 Corneal transplant status: Secondary | ICD-10-CM | POA: Diagnosis not present

## 2017-03-28 DIAGNOSIS — H04123 Dry eye syndrome of bilateral lacrimal glands: Secondary | ICD-10-CM | POA: Diagnosis not present

## 2017-04-05 ENCOUNTER — Other Ambulatory Visit: Payer: Self-pay | Admitting: Family Medicine

## 2017-04-16 DIAGNOSIS — J330 Polyp of nasal cavity: Secondary | ICD-10-CM | POA: Diagnosis not present

## 2017-04-16 DIAGNOSIS — J310 Chronic rhinitis: Secondary | ICD-10-CM | POA: Diagnosis not present

## 2017-04-17 ENCOUNTER — Encounter: Payer: Self-pay | Admitting: Family Medicine

## 2017-04-17 ENCOUNTER — Ambulatory Visit (INDEPENDENT_AMBULATORY_CARE_PROVIDER_SITE_OTHER): Payer: 59 | Admitting: Family Medicine

## 2017-04-17 VITALS — BP 110/78 | HR 94 | Temp 98.5°F | Resp 16 | Ht 66.0 in | Wt 131.8 lb

## 2017-04-17 DIAGNOSIS — K529 Noninfective gastroenteritis and colitis, unspecified: Secondary | ICD-10-CM | POA: Diagnosis not present

## 2017-04-17 LAB — CBC WITH DIFFERENTIAL/PLATELET
BASOS PCT: 0.7 % (ref 0.0–3.0)
Basophils Absolute: 0 10*3/uL (ref 0.0–0.1)
EOS ABS: 0.3 10*3/uL (ref 0.0–0.7)
Eosinophils Relative: 4.6 % (ref 0.0–5.0)
HCT: 43.7 % (ref 36.0–46.0)
Hemoglobin: 14.8 g/dL (ref 12.0–15.0)
Lymphocytes Relative: 28.1 % (ref 12.0–46.0)
Lymphs Abs: 2 10*3/uL (ref 0.7–4.0)
MCHC: 34 g/dL (ref 30.0–36.0)
MCV: 91.2 fl (ref 78.0–100.0)
MONO ABS: 0.5 10*3/uL (ref 0.1–1.0)
Monocytes Relative: 7.7 % (ref 3.0–12.0)
NEUTROS ABS: 4.1 10*3/uL (ref 1.4–7.7)
Neutrophils Relative %: 58.9 % (ref 43.0–77.0)
PLATELETS: 259 10*3/uL (ref 150.0–400.0)
RBC: 4.79 Mil/uL (ref 3.87–5.11)
RDW: 13.6 % (ref 11.5–15.5)
WBC: 7 10*3/uL (ref 4.0–10.5)

## 2017-04-17 LAB — POC URINALSYSI DIPSTICK (AUTOMATED)
BILIRUBIN UA: NEGATIVE
Blood, UA: NEGATIVE
GLUCOSE UA: NEGATIVE
KETONES UA: NEGATIVE
Leukocytes, UA: NEGATIVE
Nitrite, UA: NEGATIVE
Protein, UA: NEGATIVE
SPEC GRAV UA: 1.01 (ref 1.010–1.025)
Urobilinogen, UA: 0.2 E.U./dL
pH, UA: 6 (ref 5.0–8.0)

## 2017-04-17 LAB — COMPREHENSIVE METABOLIC PANEL
ALT: 19 U/L (ref 0–35)
AST: 18 U/L (ref 0–37)
Albumin: 4.5 g/dL (ref 3.5–5.2)
Alkaline Phosphatase: 95 U/L (ref 39–117)
BUN: 13 mg/dL (ref 6–23)
CHLORIDE: 104 meq/L (ref 96–112)
CO2: 29 meq/L (ref 19–32)
CREATININE: 0.99 mg/dL (ref 0.40–1.20)
Calcium: 10.3 mg/dL (ref 8.4–10.5)
GFR: 76.11 mL/min (ref >60.00)
GLUCOSE: 82 mg/dL (ref 70–99)
Potassium: 4.5 mEq/L (ref 3.5–5.1)
SODIUM: 139 meq/L (ref 135–145)
Total Bilirubin: 0.4 mg/dL (ref 0.2–1.2)
Total Protein: 7.9 g/dL (ref 6.0–8.3)

## 2017-04-17 MED ORDER — ONDANSETRON HCL 8 MG PO TABS: 8.0000 mg | ORAL_TABLET | Freq: Three times a day (TID) | ORAL | 0 refills | Status: DC | PRN

## 2017-04-17 NOTE — Patient Instructions (Signed)
Nausea and Vomiting, Adult Nausea is the feeling that you have an upset stomach or have to vomit. As nausea gets worse, it can lead to vomiting. Vomiting occurs when stomach contents are thrown up and out of the mouth. Vomiting can make you feel weak and cause you to become dehydrated. Dehydration can make you tired and thirsty, cause you to have a dry mouth, and decrease how often you urinate. Older adults and people with other diseases or a weak immune system are at higher risk for dehydration. It is important to treat your nausea and vomiting as told by your health care provider. Follow these instructions at home: Follow instructions from your health care provider about how to care for yourself at home. Eating and drinking Follow these recommendations as told by your health care provider:  Take an oral rehydration solution (ORS). This is a drink that is sold at pharmacies and retail stores.  Drink clear fluids in small amounts as you are able. Clear fluids include water, ice chips, diluted fruit juice, and low-calorie sports drinks.  Eat bland, easy-to-digest foods in small amounts as you are able. These foods include bananas, applesauce, rice, lean meats, toast, and crackers.  Avoid fluids that contain a lot of sugar or caffeine, such as energy drinks, sports drinks, and soda.  Avoid alcohol.  Avoid spicy or fatty foods.  General instructions  Drink enough fluid to keep your urine clear or pale yellow.  Wash your hands often. If soap and water are not available, use hand sanitizer.  Make sure that all people in your household wash their hands well and often.  Take over-the-counter and prescription medicines only as told by your health care provider.  Rest at home while you recover.  Watch your condition for any changes.  Breathe slowly and deeply when you feel nauseated.  Keep all follow-up visits as told by your health care provider. This is important. Contact a health care  provider if:  You have a fever.  You cannot keep fluids down.  Your symptoms get worse.  You have new symptoms.  Your nausea does not go away after two days.  You feel light-headed or dizzy.  You have a headache.  You have muscle cramps. Get help right away if:  You have pain in your chest, neck, arm, or jaw.  You feel extremely weak or you faint.  You have persistent vomiting.  You see blood in your vomit.  Your vomit looks like black coffee grounds.  You have bloody or black stools or stools that look like tar.  You have a severe headache, a stiff neck, or both.  You have a rash.  You have severe pain, cramping, or bloating in your abdomen.  You have trouble breathing or you are breathing very quickly.  Your heart is beating very quickly.  Your skin feels cold and clammy.  You feel confused.  You have pain when you urinate.  You have signs of dehydration, such as: ? Dark urine, very little urine, or no urine. ? Cracked lips. ? Dry mouth. ? Sunken eyes. ? Sleepiness. ? Weakness. These symptoms may represent a serious problem that is an emergency. Do not wait to see if the symptoms will go away. Get medical help right away. Call your local emergency services (911 in the [place] Do not drive yourself to the hospital. This information is not intended to replace advice given to you by your health care provider. Make sure you discuss any questions you   have with your health care provider. Document Released: 09/25/2005 Document Revised: 02/28/2016 Document Reviewed: 06/01/2015 Elsevier Interactive Patient Education  2017 Elsevier Inc.  

## 2017-04-17 NOTE — Progress Notes (Signed)
Patient ID: [name], female   DOB: [birth year], 51 y.o.   MRN: [id number]     Subjective:  I acted as a Education administrator for Dr. Carollee Herter.  Guerry Bruin, Vader   Patient ID: [name], female    DOB: Apr 25, 1966, 51 y.o.   MRN: [id number]  Chief Complaint  Patient presents with  . Nausea    HPI  Patient is in today for nausea.  Has been nauseated since last Thursday.  She did have some lightheadedness and headaches.  Today she just feel nauseated and fatigue.  She does have some diarrhea. + vomiting -- last episode this am-- she has held down water today.  No diarrhea but occassional loose stools. No fever --- + chills   Symptoms started Thursday and worse over the weekend.  She went to work today but she had NV.      Patient Care Team: Carollee Herter, Alferd Apa, DO as PCP - General (Family Medicine)   Past Medical History:  Diagnosis Date  . Anxiety   . Breast cancer [facility]) 1999   2004  . Headache   . Hyperlipidemia   . Numbness     Past Surgical History:  Procedure Laterality Date  . ABDOMINAL HYSTERECTOMY  2006   TAH,BSO secondary bleeding and breast cancer--  . MASTECTOMY     bilateral---1999/2004    Family History  Problem Relation Age of Onset  . Stroke Sister 44       after starting OCP  . Hyperlipidemia Sister   . Aortic aneurysm Mother   . Aortic aneurysm Unknown   . Hypertension Unknown   . Seizures Neg Hx   . Migraines Neg Hx     Social History   Social History  . Marital status: Single    Spouse name: N/A  . Number of children: 1  . Years of education: 61   Occupational History  . works for [employer]; office specialist    Social History Main Topics  . Smoking status: Current Some Day Smoker    Packs/day: 0.50    Years: 27.00    Types: Cigarettes  . Smokeless tobacco: Never Used     Comment: Trying to quit  . Alcohol use 0.0 oz/week     Comment: Rare  . Drug use: No  . Sexual activity: Not Currently   Other Topics Concern  . Not on  file   Social History Narrative   Marital status: single      Children: one daughter      Lives: with sister      Employment: works for [employer]; office specialist      Education: Multimedia programmer      Tobacco: trying to quit      Alcohol: none; rare      Drugs: none      Exercise: walking daily 2 miles      Caffeine use: Tea/pepsi- 1 pepsi/morning      Tea- 3 cups per day   Right-handed       Outpatient Medications Prior to Visit  Medication Sig Dispense Refill  . ALPRAZolam (XANAX) 0.25 MG tablet TAKE 1 TABLET BY MOUTH TWICE DAILY AS NEEDED FOR ANXIETY 30 tablet 0  . aspirin 81 MG tablet Take 1 tablet (81 mg total) by mouth daily. 30 tablet   . atorvastatin (LIPITOR) 10 MG tablet TAKE 1 TABLET(10 MG) BY MOUTH DAILY AT 6 PM 90 tablet 0  . escitalopram (LEXAPRO) 10 MG  tablet TAKE 1 TABLET(10 MG) BY MOUTH DAILY 90 tablet 0  . nicotine polacrilex (NICORETTE) 4 MG gum Take 4 mg by mouth as needed for smoking cessation.    Marland Kitchen levocetirizine (XYZAL) 5 MG tablet Take 5 mg by mouth every evening.     No facility-administered medications prior to visit.     Allergies  Allergen Reactions  . Penicillins Other (See Comments)    Childhood reaction    Review of Systems  Constitutional: Positive for malaise/fatigue. Negative for fever.  HENT: Negative for congestion.   Eyes: Negative for blurred vision.  Respiratory: Negative for cough and shortness of breath.   Cardiovascular: Negative for chest pain, palpitations and leg swelling.  Gastrointestinal: Positive for diarrhea and nausea. Negative for vomiting.  Musculoskeletal: Negative for back pain.  Skin: Negative for rash.  Neurological: Positive for headaches. Negative for loss of consciousness.       Objective:    Physical Exam  Constitutional: She is oriented to person, place, and time. She appears well-developed and well-nourished.  HENT:  Head: Normocephalic and atraumatic.  Eyes: Conjunctivae  and EOM are normal.  Neck: Normal range of motion. Neck supple. No JVD present. Carotid bruit is not present. No thyromegaly present.  Cardiovascular: Normal rate, regular rhythm and normal heart sounds.   No murmur heard. Pulmonary/Chest: Effort normal and breath sounds normal. No respiratory distress. She has no wheezes. She has no rales. She exhibits no tenderness.  Abdominal: Soft. She exhibits no distension and no mass. There is tenderness in the right upper quadrant and epigastric area. There is no rebound and no guarding.    Musculoskeletal: She exhibits no edema.  Neurological: She is alert and oriented to person, place, and time.  Psychiatric: She has a normal mood and affect.  Nursing note and vitals reviewed.   BP 110/78 (BP Location: Right Arm, Cuff Size: Normal)   Pulse 94   Temp 98.5 F (36.9 C) (Oral)   Resp 16   Ht 5\' 6"  (1.676 m)   Wt 131 lb 12.8 oz (59.8 kg)   SpO2 93%   BMI 21.27 kg/m  Wt Readings from Last 3 Encounters:  04/17/17 131 lb 12.8 oz (59.8 kg)  11/20/16 135 lb 6.4 oz (61.4 kg)  10/27/16 135 lb 12.8 oz (61.6 kg)   BP Readings from Last 3 Encounters:  04/17/17 110/78  11/20/16 118/84  10/27/16 114/70     Immunization History  Administered Date(s) Administered  . Influenza Split 07/03/2012  . Influenza Whole 08/16/2010  . Influenza,inj,Quad PF,36+ Mos 07/18/2013  . Td 10/09/2009    Health Maintenance  Topic Date Due  . HIV Screening  06/25/1981  . COLONOSCOPY  06/25/2016  . PAP SMEAR  07/18/2016  . INFLUENZA VACCINE  05/09/2017  . TETANUS/TDAP  10/10/2019    Lab Results  Component Value Date   WBC 7.0 04/17/2017   HGB 14.8 04/17/2017   HCT 43.7 04/17/2017   PLT 259.0 04/17/2017   GLUCOSE 82 04/17/2017   CHOL 139 01/24/2016   TRIG 115.0 01/24/2016   HDL 30.70 (L) 01/24/2016   LDLDIRECT 141.3 07/03/2012   LDLCALC 85 01/24/2016   ALT 19 04/17/2017   AST 18 04/17/2017   NA 139 04/17/2017   K 4.5 04/17/2017   CL 104 04/17/2017     CREATININE 0.99 04/17/2017   BUN 13 04/17/2017   CO2 29 04/17/2017   TSH 1.02 08/22/2016   HGBA1C 5.6 12/02/2013   MICROALBUR <0.7 01/24/2016    Lab  Results  Component Value Date   TSH 1.02 08/22/2016   Lab Results  Component Value Date   WBC 7.0 04/17/2017   HGB 14.8 04/17/2017   HCT 43.7 04/17/2017   MCV 91.2 04/17/2017   PLT 259.0 04/17/2017   Lab Results  Component Value Date   NA 139 04/17/2017   K 4.5 04/17/2017   CO2 29 04/17/2017   GLUCOSE 82 04/17/2017   BUN 13 04/17/2017   CREATININE 0.99 04/17/2017   BILITOT 0.4 04/17/2017   ALKPHOS 95 04/17/2017   AST 18 04/17/2017   ALT 19 04/17/2017   PROT 7.9 04/17/2017   ALBUMIN 4.5 04/17/2017   CALCIUM 10.3 04/17/2017   ANIONGAP 11 10/31/2015   GFR 76.11 04/17/2017   Lab Results  Component Value Date   CHOL 139 01/24/2016   Lab Results  Component Value Date   HDL 30.70 (L) 01/24/2016   Lab Results  Component Value Date   LDLCALC 85 01/24/2016   Lab Results  Component Value Date   TRIG 115.0 01/24/2016   Lab Results  Component Value Date   CHOLHDL 5 01/24/2016   Lab Results  Component Value Date   HGBA1C 5.6 12/02/2013         Assessment & Plan:   Problem List Items Addressed This Visit    None    Visit Diagnoses    Gastroenteritis    -  Primary   Relevant Medications   ondansetron (ZOFRAN) 8 MG tablet   Other Relevant Orders   CBC with Differential/Platelet (Completed)   Comprehensive metabolic panel (Completed)   POCT Urinalysis Dipstick (Automated) (Completed)    if no improvement -- we can try phenergan IM If vomiting con't -- will need to go to ER for IVF  I have discontinued Ms. Bencosme levocetirizine. I am also having her start on ondansetron. Additionally, I am having her maintain her aspirin, nicotine polacrilex, escitalopram, ALPRAZolam, and atorvastatin.  Meds ordered this encounter  Medications  . ondansetron (ZOFRAN) 8 MG tablet    Sig: Take 1 tablet (8 mg total)  by mouth every 8 (eight) hours as needed for nausea or vomiting.    Dispense:  20 tablet    Refill:  0    CMA served as scribe during this visit. History, Physical and Plan performed by medical provider. Documentation and orders reviewed and attested to.  [name], DO

## 2017-05-10 ENCOUNTER — Other Ambulatory Visit: Payer: Self-pay | Admitting: Family Medicine

## 2017-06-25 DIAGNOSIS — S0501XA Injury of conjunctiva and corneal abrasion without foreign body, right eye, initial encounter: Secondary | ICD-10-CM | POA: Diagnosis not present

## 2017-06-25 DIAGNOSIS — H0012 Chalazion right lower eyelid: Secondary | ICD-10-CM | POA: Diagnosis not present

## 2017-06-29 ENCOUNTER — Other Ambulatory Visit: Payer: Self-pay | Admitting: Family Medicine

## 2017-06-29 DIAGNOSIS — F411 Generalized anxiety disorder: Secondary | ICD-10-CM

## 2017-07-02 DIAGNOSIS — Z947 Corneal transplant status: Secondary | ICD-10-CM | POA: Diagnosis not present

## 2017-07-02 DIAGNOSIS — H16103 Unspecified superficial keratitis, bilateral: Secondary | ICD-10-CM | POA: Diagnosis not present

## 2017-07-11 DIAGNOSIS — Z947 Corneal transplant status: Secondary | ICD-10-CM | POA: Diagnosis not present

## 2017-07-11 DIAGNOSIS — H01022 Squamous blepharitis right lower eyelid: Secondary | ICD-10-CM | POA: Diagnosis not present

## 2017-07-11 DIAGNOSIS — H01021 Squamous blepharitis right upper eyelid: Secondary | ICD-10-CM | POA: Diagnosis not present

## 2017-07-12 ENCOUNTER — Ambulatory Visit (INDEPENDENT_AMBULATORY_CARE_PROVIDER_SITE_OTHER): Payer: 59 | Admitting: Medical

## 2017-07-12 ENCOUNTER — Encounter: Payer: Self-pay | Admitting: Medical

## 2017-07-12 VITALS — BP 118/80 | HR 101 | Temp 98.0°F | Resp 16 | Ht 66.0 in | Wt 128.8 lb

## 2017-07-12 DIAGNOSIS — R519 Headache, unspecified: Secondary | ICD-10-CM

## 2017-07-12 DIAGNOSIS — R195 Other fecal abnormalities: Secondary | ICD-10-CM

## 2017-07-12 DIAGNOSIS — R51 Headache: Secondary | ICD-10-CM | POA: Diagnosis not present

## 2017-07-12 DIAGNOSIS — R42 Dizziness and giddiness: Secondary | ICD-10-CM

## 2017-07-12 DIAGNOSIS — R110 Nausea: Secondary | ICD-10-CM | POA: Diagnosis not present

## 2017-07-12 MED ORDER — MECLIZINE HCL 12.5 MG PO TABS: 12.5000 mg | ORAL_TABLET | Freq: Three times a day (TID) | ORAL | 0 refills | Status: DC | PRN

## 2017-07-12 MED ORDER — ONDANSETRON 8 MG PO TBDP: 8.0000 mg | ORAL_TABLET | Freq: Three times a day (TID) | ORAL | 0 refills | Status: DC | PRN

## 2017-07-12 NOTE — Progress Notes (Signed)
Subjective:    Patient ID: [name], female    DOB: [birth year], 51 y.o.   MRN: [id number]  HPI  Pt in with feeling sick since Tuesday. She had onset of some upset stomach, diarrhea, intermittent dizzy, and mild ha.  No vomiting. No fever, no chills or sweats(other than hot flashes). Symptoms are still persisting but intermittent. Particularly then dizziness. None present today on exam. No recent hospitalization. No friends sick and no suspicious foods reported. No recent antibiotic use reported.  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 Pt states given after severe swelling underneath her rt cheek. Eye swelled and her eye MD had concern for detached retina. Pt followed up with eye MD yesterday.  Pt has about 2 loose stools a day. She is cutting back on foods since symptoms started.    Review of Systems  Constitutional: Negative for chills, fatigue and fever.  HENT: Negative for congestion, ear discharge, ear pain, sinus pressure and sore throat.   Respiratory: Negative for cough, chest tightness, shortness of breath and wheezing.   Cardiovascular: Negative for chest pain and palpitations.  Gastrointestinal: Positive for abdominal pain, diarrhea and nausea. Negative for abdominal distention, constipation and vomiting.   Past Medical History:  Diagnosis Date  . Anxiety   . Breast cancer [facility]) 1999   2004  . Headache   . Hyperlipidemia   . Numbness      Social History   Social History  . Marital status: Single    Spouse name: N/A  . Number of children: 1  . Years of education: 75   Occupational History  . works for [employer]; office specialist    Social History Main Topics  . Smoking status: Current Some Day Smoker    Packs/day: 0.50    Years: 27.00    Types: Cigarettes  . Smokeless tobacco: Never Used     Comment: Trying to quit  . Alcohol use 0.0 oz/week     Comment: Rare  . Drug use: No  . Sexual activity: Not  Currently   Other Topics Concern  . Not on file   Social History Narrative   Marital status: single      Children: one daughter      Lives: with sister      Employment: works for [employer]; office specialist      Education: Multimedia programmer      Tobacco: trying to quit      Alcohol: none; rare      Drugs: none      Exercise: walking daily 2 miles      Caffeine use: Tea/pepsi- 1 pepsi/morning      Tea- 3 cups per day   Right-handed       Past Surgical History:  Procedure Laterality Date  . ABDOMINAL HYSTERECTOMY  2006   TAH,BSO secondary bleeding and breast cancer--  . MASTECTOMY     bilateral---1999/2004    Family History  Problem Relation Age of Onset  . Stroke Sister 67       after starting OCP  . Hyperlipidemia Sister   . Aortic aneurysm Mother   . Aortic aneurysm Unknown   . Hypertension Unknown   . Seizures Neg Hx   . Migraines Neg Hx     Allergies  Allergen Reactions  . Penicillins Other (See Comments)    Childhood reaction    Current Outpatient Prescriptions on File Prior to Visit  Medication Sig Dispense Refill  . ALPRAZolam (XANAX) 0.25 MG tablet TAKE 1 TABLET BY MOUTH TWICE DAILY AS NEEDED FOR ANXIETY 30 tablet 0  . aspirin 81 MG tablet Take 1 tablet (81 mg total) by mouth daily. 30 tablet   . atorvastatin (LIPITOR) 10 MG tablet TAKE 1 TABLET(10 MG) BY MOUTH DAILY AT 6 PM 90 tablet 0  . escitalopram (LEXAPRO) 10 MG tablet TAKE 1 TABLET(10 MG) BY MOUTH DAILY 90 tablet 0  . nicotine polacrilex (NICORETTE) 4 MG gum Take 4 mg by mouth as needed for smoking cessation.     No current facility-administered medications on file prior to visit.     BP 118/80   Pulse (!) 101   Temp 98 F (36.7 C) (Oral)   Resp 16   Ht 5\' 6"  (1.676 m)   Wt 128 lb 12.8 oz (58.4 kg)   SpO2 97%   BMI 20.79 kg/m       Objective:   Physical Exam  General- No acute distress. Pleasant patient. Neck- Full range of motion, no jvd Lungs- Clear,  even and unlabored. Heart- regular rate and rhythm. Neurologic- CNII- XII grossly intact.  Abdomen- soft non-tender, non distended. +bs, no rebound or guarding.  Back- no cva tenderness  Neurologic Cranial Nerve exam:- CN III-XII intact(No nystagmus), symmetric smile. Strength:- 5/5 equal and symmetric strength both upper and lower extremities.  Skin-moist/not dry on palpation.    Assessment & Plan:  Your recent loose stools and associated symptoms may indicate viral gastroenteritis. Your symptoms may resolve relatively quickly but if they don't stool panel studies may be beneficial in light of your occupation.  Will also get labs to assess infection fighting cell count and evaluate your electrolytes(particularly sugar level post steroid use.).  Recommend rest, hydration with propel fitness water, and bland foods.  For recurrent dizziness that is prolonged Rx of meclizine given.  For nausea or vomiting Rx of Zofran.  For low level headache recommend Tylenol.  For upset stomach can use Zantac OTC.  We'll follow your lab results and stool panel studies and let you know the results as they come in. Also if diarrhea/loose stools worsen over weekend then advise use of Imodium.  Follow-up in 7 days or as needed. If worsening or changing sinus symptoms please let us know.  Torian Thoennes, Percell Miller, PA-C

## 2017-07-12 NOTE — Patient Instructions (Addendum)
Your recent loose stools and associated symptoms may indicate viral gastroenteritis. Your symptoms may resolve relatively quickly but if they don't stool panel studies may be beneficial in light of your occupation.  Will also get labs to assess infection fighting cell count and evaluate your electrolytes(particularly sugar level post steroid use.).  Recommend rest, hydration with propel fitness water, and bland foods.  For recurrent dizziness that is prolonged Rx of meclizine given.  For nausea or vomiting Rx of Zofran.  For low level headache recommend Tylenol.  For upset stomach can use Zantac OTC.  We'll follow your lab results and stool panel studies and let you know the results as they come in. Also if diarrhea/loose stools worsen over weekend then advise use of Imodium.  Follow-up in 7 days or as needed. If worsening or changing sinus symptoms please let us know.

## 2017-07-13 LAB — CBC WITH DIFFERENTIAL/PLATELET
BASOS PCT: 1.1 % (ref 0.0–3.0)
Basophils Absolute: 0.1 10*3/uL (ref 0.0–0.1)
Eosinophils Absolute: 0.3 10*3/uL (ref 0.0–0.7)
Eosinophils Relative: 4.2 % (ref 0.0–5.0)
HCT: 49.2 % — ABNORMAL HIGH (ref 36.0–46.0)
Hemoglobin: 16.3 g/dL — ABNORMAL HIGH (ref 12.0–15.0)
LYMPHS PCT: 36.1 % (ref 12.0–46.0)
Lymphs Abs: 2.5 10*3/uL (ref 0.7–4.0)
MCHC: 33.1 g/dL (ref 30.0–36.0)
MCV: 93.2 fl (ref 78.0–100.0)
MONOS PCT: 7.9 % (ref 3.0–12.0)
Monocytes Absolute: 0.5 10*3/uL (ref 0.1–1.0)
NEUTROS ABS: 3.5 10*3/uL (ref 1.4–7.7)
NEUTROS PCT: 50.7 % (ref 43.0–77.0)
Platelets: 296 10*3/uL (ref 150.0–400.0)
RBC: 5.27 Mil/uL — ABNORMAL HIGH (ref 3.87–5.11)
RDW: 13.3 % (ref 11.5–15.5)
WBC: 6.9 10*3/uL (ref 4.0–10.5)

## 2017-07-13 LAB — COMPREHENSIVE METABOLIC PANEL
ALK PHOS: 95 U/L (ref 39–117)
ALT: 13 U/L (ref 0–35)
AST: 12 U/L (ref 0–37)
Albumin: 4.6 g/dL (ref 3.5–5.2)
BUN: 13 mg/dL (ref 6–23)
CHLORIDE: 101 meq/L (ref 96–112)
CO2: 29 mEq/L (ref 19–32)
Calcium: 10.5 mg/dL (ref 8.4–10.5)
Creatinine, Ser: 1.11 mg/dL (ref 0.40–1.20)
GFR: 66.63 mL/min (ref >60.00)
GLUCOSE: 94 mg/dL (ref 70–99)
Potassium: 5.3 mEq/L — ABNORMAL HIGH (ref 3.5–5.1)
SODIUM: 136 meq/L (ref 135–145)
TOTAL PROTEIN: 8.2 g/dL (ref 6.0–8.3)
Total Bilirubin: 0.3 mg/dL (ref 0.2–1.2)

## 2017-07-15 ENCOUNTER — Telehealth: Payer: Self-pay | Admitting: Medical

## 2017-07-15 ENCOUNTER — Encounter: Payer: Self-pay | Admitting: Medical

## 2017-07-15 DIAGNOSIS — E876 Hypokalemia: Secondary | ICD-10-CM

## 2017-07-15 NOTE — Telephone Encounter (Signed)
Recheck cmp for low potassium

## 2017-07-18 NOTE — Telephone Encounter (Signed)
°  Relation to PZ:WCHE Call back number:[phone]   Reason for call:  Patient would like to speak with Percell Miller or nurse today regarding her potassium stating she's very concerned about her levels and what foods should she not eat, patient states she's extremely stressed out and would like to discuss her most recent labs,please advise

## 2017-07-18 NOTE — Telephone Encounter (Signed)
I'll call patient [name] and reviewed with her her potassium level. Explained to her that is very minimally elevated and we discussed list of foods that increase potassium. For her leafy greens and salads might be the culprit. I explained to her that her level is very minimally elevated and I'm not that concerned but wanted to advise her regarding diet. She will repeat the potassium next Thursday. Also on reviewing her historical potassium levels 2 years ago she had a level of 5.0. At that time the upper limit range was 5.3. I explained to her that some labs to have varying ranges. Patient will try to minimize her potassium and will follow up with her lab.

## 2017-07-23 DIAGNOSIS — Z947 Corneal transplant status: Secondary | ICD-10-CM | POA: Diagnosis not present

## 2017-07-23 DIAGNOSIS — T86898 Other complications of other transplanted tissue: Secondary | ICD-10-CM | POA: Diagnosis not present

## 2017-07-26 ENCOUNTER — Other Ambulatory Visit: Payer: 59

## 2017-07-30 ENCOUNTER — Other Ambulatory Visit: Payer: Self-pay | Admitting: Family Medicine

## 2017-07-31 NOTE — Telephone Encounter (Signed)
Requested drug refills are authorized, however only given #90, the patient needs further evaluation and/or laboratory testing before further refills are given. Ask her to make an appointment for this.//AB/CMA

## 2017-08-06 ENCOUNTER — Other Ambulatory Visit: Payer: Self-pay | Admitting: Family Medicine

## 2017-08-06 NOTE — Telephone Encounter (Signed)
Patient requesting med refill on ALPRAZolam (XANAX) 0.25 MG tablet  Please advise.

## 2017-08-07 NOTE — Telephone Encounter (Signed)
Refill x1 

## 2017-08-07 NOTE — Telephone Encounter (Signed)
Database printed and is at your desk for alprazolam  Last filled per database: 12/12/16 Last written: 12/11/16 #30 Last ov: 07/12/17 Next ov: nothing scheduled Contract: not on file UDS: not on file  Would you like a contract and uds?

## 2017-08-08 ENCOUNTER — Other Ambulatory Visit: Payer: Self-pay

## 2017-08-08 DIAGNOSIS — G4700 Insomnia, unspecified: Secondary | ICD-10-CM

## 2017-08-08 MED ORDER — ALPRAZOLAM 0.25 MG PO TABS: 0.2500 mg | ORAL_TABLET | Freq: Two times a day (BID) | ORAL | 0 refills | Status: DC | PRN

## 2017-08-08 NOTE — Telephone Encounter (Signed)
Pt will need a UDS contract signed for any and all controlled refills in the future. UDS given.

## 2017-08-08 NOTE — Telephone Encounter (Signed)
Refilled and I will call the Pt to let her know that for any and all controlled Rx in the future she will need to come in and sign our UDS contract. Pt had UDS in the past, however did not signed contract on file from what I can see. Called in to pharmacy as requested. Called Pt and told her in the future for any other refills for controlleds she will have to sign the UDS contract. Pt stated she'd comply.

## 2017-08-15 ENCOUNTER — Ambulatory Visit: Payer: 59 | Admitting: Neurology

## 2017-08-15 ENCOUNTER — Encounter: Payer: Self-pay | Admitting: Neurology

## 2017-08-15 VITALS — BP 105/77 | HR 91 | Ht 66.0 in | Wt 134.2 lb

## 2017-08-15 DIAGNOSIS — E875 Hyperkalemia: Secondary | ICD-10-CM

## 2017-08-15 DIAGNOSIS — R202 Paresthesia of skin: Secondary | ICD-10-CM | POA: Diagnosis not present

## 2017-08-15 DIAGNOSIS — Z947 Corneal transplant status: Secondary | ICD-10-CM | POA: Diagnosis not present

## 2017-08-15 DIAGNOSIS — F419 Anxiety disorder, unspecified: Secondary | ICD-10-CM

## 2017-08-15 DIAGNOSIS — H2513 Age-related nuclear cataract, bilateral: Secondary | ICD-10-CM | POA: Diagnosis not present

## 2017-08-15 DIAGNOSIS — H04123 Dry eye syndrome of bilateral lacrimal glands: Secondary | ICD-10-CM | POA: Diagnosis not present

## 2017-08-15 NOTE — Progress Notes (Signed)
[facility]    Provider:  Dr Jaynee Eagles Referring Provider: Carollee Herter, Alferd Apa, * Primary Care Physician:  Carollee Herter, Alferd Apa, DO CC: Left-sided paresthesias  Interval history 08/15/2017: patient returns for left-sided paresthesias, multiple MRIs, EEGs and other workup have been negative for etiology possibly anxiety. 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 The lest-sided symptoms happen when she gets very anxious. Still seeing Dr. Ernesto Rutherford, no more bad smells. She has not had to use her xanax still has a prescription from march. Anxiety improved on Lexapro. Takes ASA 81mg  daily for stroke prevention.  Interval history 11/20/2016: She was started on Lexapro and xanax for anxiety and levocetirizine for insomnia - she has not started the Lexapro and I encouraged her to start it for her anxiety which may be the cause of her symptoms or at least exacerbating them. She still has left-sided numbness which makes her even more anxious. 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 She has stopped caffeine. She denies headaches. Routine EEG was normal. She had a normal stroke workup in 2015 for facial droop.   Today she says if she gets sick or anxious she has left-sided tingling. She has had episodes of feeling sick to her stomach and dizziness, left back of her head numb and numb in the should and calf or numb in the left hand and foot. Also numbness on the left side of her face. No other issues. No weakness. No focal deficits. No headache. Lightheaded during the episodes. Not breathing hard but she did start stressing. She drank some caffeine which resolved the symptoms, she was told she wass going through caffeine withdrawal. It still makes her significantly anxious. Xanax  will also resolve the tingling.   Interval history 08/08/2016: She saw Dr. Ernesto Rutherford and she was treated, she was treated for sinus disease and the abnormal smells resolved. Abnormal smells resolved. No further episodes. Feeling well. No issues. Discussed MRI of the brain, she should have repeat MRI every few years to follow pituitary unless she has symptoms such as vision changes and headaches or anything concerning then needs repeat sooner. F/u one year.  VQM:GQQPY L Jonesis a 51 y.o.femalehere as a referral from Dr. Marlyne Beards abnormal smells new onset recently and left-sided paresthesias for 6 years. Past medical history anxiety, HLD. She recently has episode of smelling gas/diesel that is not there. She is under a lot of stress due to her Aunt's AML recently placed into hospice. During an episode of anxiety she smelled diesel and gas, brief, had multiple episodes over 4-6 days. Her aunt is dying, she is having a hard time with it. She has tingling left side of the body usually worse when she is nervous/anxious. She had the abnormal smells off an or for 4 days, would last briefly. Her whole left side is tingly and heavy. Can last briefly, worse with stress. No weakness. No aphasia or dysarthria. She has difficulty concentrating, worse with stress. She has difficulty sleeping. She has insomnia. Sleeping is poor. No head trauma or other inciting events. She is feeling very fatigued, tired, decreased concentration. She is scared about her aunt dying. She is afraid she is having TIAs as well. She smoked for 10 years, she is trying to quit. She has 3 sisters, she handles everything for her Aunt who  is like her mother. No headaches, no new vision changes, no other focal neurologic deficits. Alprazolam helps with her symptoms. She was hospitalized 6 years ago and evaluation for stroke was negative. 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 She had some mild dysfunction. 3 days previously she began to intermittently smelling odor as a mix between chemical and diesel fuel. In the smell last for a maximum of 3 minutes at a time and abruptly disappears. She also has anxiety with tingling in her limbs. But the symptoms are not new for her. She denied any new fevers chills, headache, neurologic deficits, difficulty breathing, nausea vomiting or other complaints.Exam was normal including general exam and neurologic exam. Since there are no indications of neurologic abnormalities or functional deficits aresigns of seizures they diagnosed her with possible anxiety and referral to neurology. No imaging was completed.  Patient was also seen in 2015 and admitted for right facial droop. Imaging showed no stroke. No signs of MS or other etiologies and MRI. No stenosis or significant issues on vascular imaging. No resultant neuro deficits. Was not felt to be a stroke.  CMP normal.  LDL 85.   FINDINGS: MRI HEAD 2015 (personally reviewed images and agree with the following)  No evidence for acute infarction, hemorrhage, mass lesion, hydrocephalus, or extra-axial fluid. No atrophy or white matter disease. Flow voids are maintained throughout the carotid, basilar, and vertebral arteries. There are no areas of chronic hemorrhage. Pituitary, pineal, and cerebellar tonsils unremarkable. No upper cervical lesions.  Negative orbits, and osseous structures. Chronic bilateral maxillary sinus disease. Chronic bilateral ethmoid sinus disease. 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 Trace left mastoid effusion.  Good general agreement prior CT.  MRA HEAD FINDINGS  Internal carotid arteries widely patent. Basilar artery widely patent with left greater than right vertebrals  contributing. Fetal origin left PCA. 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 No intracranial stenosis or dissection. No visible intracranial aneurysm.    Review of Systems: Patient complains of symptoms per HPI as well as the following symptoms: hyperkalemia. Pertinent negatives and positives per HPI. All others negative.   Social History   Socioeconomic History  . Marital status: Single    Spouse name: Not on file  . Number of children: 1  . Years of education: 63  . Highest education level: Not on file  Social Needs  . Financial resource strain: Not on file  . Food insecurity - worry: Not on file  . Food insecurity - inability: Not on file  . Transportation needs - medical: Not on file  . Transportation needs - non-medical: Not on file  Occupational History  . Occupation: works for [employer]; office specialist  Tobacco Use  . Smoking status: Current Some Day Smoker    Packs/day: 0.25    Years: 27.00    Pack years: 6.75    Types: Cigarettes  . Smokeless tobacco: Never Used  . Tobacco comment: Trying to quit  Substance and Sexual Activity  . Alcohol use: Yes    Alcohol/week: 0.0 oz    Comment: Rare  . Drug use: No  . Sexual activity: Not Currently  Other Topics Concern  . Not on file  Social History Narrative  Marital status: single      Children: one daughter      Lives: with sister      Employment: works for [employer]; office specialist      Education: Multimedia programmer      Tobacco: trying to quit      Alcohol: none; rare      Drugs: none      Exercise: walking daily 2 miles      Caffeine use: Tea/pepsi- 1 pepsi/morning      Tea- 3 cups per day   Right-handed    Family History  Problem Relation Age of Onset  . Stroke Sister 49       after starting OCP  . Hyperlipidemia Sister   . Aortic aneurysm Mother   . Aortic aneurysm Unknown   .  Hypertension Unknown   . Seizures Neg Hx   . Migraines Neg Hx     Past Medical History:  Diagnosis Date  . Anxiety   . Breast cancer [facility] 1999   2004  . Headache   . Hyperlipidemia   . Numbness     Past Surgical History:  Procedure Laterality Date  . ABDOMINAL HYSTERECTOMY  2006   TAH,BSO secondary bleeding and breast cancer--  . CORNEAL TRANSPLANT Bilateral    6x, started in 1999, last was 2006.  Marland Kitchen MASTECTOMY     bilateral---1999/2004    Current Outpatient Medications  Medication Sig Dispense Refill  . ALPRAZolam (XANAX) 0.25 MG tablet Take 1 tablet (0.25 mg total) by mouth 2 (two) times daily as needed. for anxiety 60 tablet 0  . aspirin 81 MG tablet Take 1 tablet (81 mg total) by mouth daily. 30 tablet   . atorvastatin (LIPITOR) 10 MG tablet TAKE 1 TABLET(10 MG) BY MOUTH DAILY AT 6 PM 90 tablet 0  . escitalopram (LEXAPRO) 10 MG tablet TAKE 1 TABLET(10 MG) BY MOUTH DAILY 90 tablet 0  . meclizine (ANTIVERT) 12.5 MG tablet Take 1 tablet (12.5 mg total) by mouth 3 (three) times daily as needed for dizziness. 30 tablet 0  . nicotine polacrilex (NICORETTE) 4 MG gum Take 4 mg by mouth as needed for smoking cessation.    . prednisoLONE acetate (PRED FORTE) 1 % ophthalmic suspension SHAKE LQ AND INT 1 GTT IN OD QID  2   No current facility-administered medications for this visit.     Allergies as of 08/15/2017 - Review Complete 08/15/2017  Allergen Reaction Noted  . Penicillins Other (See Comments) 10/11/2010    Vitals: BP 105/77 (BP Location: Right Arm, Patient Position: Sitting)   Pulse 91   Ht 5\' 6"  (1.676 m)   Wt 134 lb 3.2 oz (60.9 kg)   BMI 21.66 kg/m  Last Weight:  Wt Readings from Last 1 Encounters:  08/15/17 134 lb 3.2 oz (60.9 kg)   Last Height:   Ht Readings from Last 1 Encounters:  08/15/17 5\' 6"  (1.676 m)   Physical exam: Exam: Gen: NAD, conversant, well nourised, well groomed                     CV: RRR, no MRG. No Carotid Bruits. No peripheral  edema, warm, nontender Eyes: Conjunctivae clear without exudates or hemorrhage  Neuro: Detailed Neurologic Exam  Speech:    Speech is normal; fluent and spontaneous with normal comprehension.  Cognition:    The patient is oriented to person, place, and time;     recent and remote memory intact;  language fluent;     normal attention, concentration,     fund of knowledge Cranial Nerves:    The pupils are equal, round, and reactive to light. The fundi are normal and spontaneous venous pulsations are present. Visual fields are full to finger confrontation. Extraocular movements are intact. Trigeminal sensation is intact and the muscles of mastication are normal. The face is symmetric. The palate elevates in the midline. Hearing intact. Voice is normal. Shoulder shrug is normal. The tongue has normal motion without fasciculations.   Coordination:    Normal finger to nose and heel to shin. Normal rapid alternating movements.   Gait:    Heel-toe and tandem gait are normal.   Motor Observation:    No asymmetry, no atrophy, and no involuntary movements noted. Tone:    Normal muscle tone.    Posture:    Posture is normal. normal erect    Strength:    Strength is V/V in the upper and lower limbs.      Sensation: intact to LT     Reflex Exam:  DTR's:    Deep tendon reflexes in the upper and lower extremities are normal bilaterally.   Toes:    The toes are downgoing bilaterally.   Clonus:    Clonus is absent.   Assessment/Plan:Very lovely 51 year old with left-sided paresthesias in the setting of anxiety. She is under a lot of stress. 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   Hyperkalemia: repeat bmp Anxiety and left-sided paresthesias: resolved, continue lexapro and xanax prn Acute facial droop: asa daily, manage risk factors, stroke wup negative - Abnormal smells resolved with treatment of her sinus disease.  - EEG for epileptiform activity. EEG was normal.  - Aspirin  81mg  daily for stroke prevention as she has had facial droopin gin the past and worked up for TIA, however low likelihood these current symptoms represent TIA. Stable, no new symptoms - LDL 85, needs to follow with pcp and other vascular risk factors such as BP, discussed and she follows with pcp - repeat MRI of the brain: No significant change from 2015. - Stopped smoking - appears to have started again - Recommended therapy to help with her anxiety and situational stress: still recommending but has imorived on Lexapro - chronic sinusitis and a rather large thornwaldt cyst on MRi of the brain. A Tornwaldt's cyst is a benign cyst located in the upper posterior nasopharynx. It is a relatively rare lesion and most are small and asymptomatic whereas some cause nasal obstruction, postnasal drip, occipital headache or eustachian tube dysfunction. She has chronic sinusitis. Referred to ENT and she is currently following with improvement.   Addendum: MRI of the brain: 06/22/2016 :  The brain parenchyma shows no definite abnormalities. On flair images solitary tiny punctate areas of hyperintensity is noted in the left lateral pons but it is not seen on any other sequences and is of unclear significance. Diffusion-weighted imaging is negative for acute ischemia. Calvarium show no abnormality. Orbits appear unremarkable. Paranasal sinuses show evidence of chronic changes involving bilateral maxillary, ethmoid sinuses mainly and to lesser degree mastoids as well. There is incidental large 1.5 x 1.2 cm Thornwaldt cyst noted in the midline the posterior nasopharynx.The pituitary gland shows slight borderline enlargement of size but no distinct adenoma is noted even on thin sections and contrast administration and coronary dynamic imaging. The flow voids are large vessels of intracranial circulation appear to be patent. Visualized portion of upper cervical spine shows no abnormality.  Subarachnoid spaces and  ventricular system appear normal.   IMPRESSION: Abnormal MRI scan of the brain showing chronic changes severe maxillary and ethmoid paranasal sinusitis. Incidental large Thornwaldt cyst is noted. Overall no significant change compared with previous MRI scan dated 12/01/2013. The pituitary gland is slightly enlarged but no distinct adenoma is noted   I had a long d/w patient personally independently reviewed imaging studies and evaluation results and answered questions.Continue ASA 81 mg daily and maintain strict control of hypertension with blood pressure goal below 130/90, diabetes with hemoglobin A1c goal below 6.5% and lipids with LDL cholesterol goal below 70 mg/dL. I also advised the patient to eat a healthy diet with plenty of whole grains, cereals, fruits and vegetables, exercise regularly and maintain ideal body weight, stop smoking,.   Sarina Ill, MD    Sarina Ill, MD  [facility] [address] [place], [place] [address]  Phone [phone] Fax [phone]  A total of 25 minutes was spent face-to-face with this patient. Over half this time was spent on counseling patient on the hyperkalemia (repeat bmp), paresthesias, anxiety, stroke prevention diagnosis and different diagnostic and therapeutic options available.

## 2017-08-16 ENCOUNTER — Telehealth: Payer: Self-pay | Admitting: *Deleted

## 2017-08-16 LAB — BASIC METABOLIC PANEL
BUN / CREAT RATIO: 17 (ref 9–23)
BUN: 16 mg/dL (ref 6–24)
CALCIUM: 10 mg/dL (ref 8.7–10.2)
CHLORIDE: 105 mmol/L (ref 96–106)
CO2: 27 mmol/L (ref 20–29)
Creatinine, Ser: 0.92 mg/dL (ref 0.57–1.00)
GFR, EST AFRICAN AMERICAN: 83 mL/min/{1.73_m2} (ref >59)
GFR, EST NON AFRICAN AMERICAN: 72 mL/min/{1.73_m2} (ref >59)
Glucose: 73 mg/dL (ref 65–99)
POTASSIUM: 4.5 mmol/L (ref 3.5–5.2)
SODIUM: 143 mmol/L (ref 134–144)

## 2017-08-16 NOTE — Telephone Encounter (Signed)
Called and spoke with pt. She verbalized understanding that her labs are normal.

## 2017-08-16 NOTE — Telephone Encounter (Signed)
-----   Message from Melvenia Beam, MD sent at 08/16/2017  8:05 AM EST ----- Labs normal

## 2017-08-26 ENCOUNTER — Other Ambulatory Visit: Payer: Self-pay | Admitting: Family Medicine

## 2017-08-26 DIAGNOSIS — F411 Generalized anxiety disorder: Secondary | ICD-10-CM

## 2017-09-24 ENCOUNTER — Other Ambulatory Visit: Payer: Self-pay | Admitting: Family Medicine

## 2017-09-24 DIAGNOSIS — F411 Generalized anxiety disorder: Secondary | ICD-10-CM

## 2017-10-23 ENCOUNTER — Other Ambulatory Visit: Payer: Self-pay | Admitting: Family Medicine

## 2017-11-01 ENCOUNTER — Encounter: Payer: Self-pay | Admitting: Family Medicine

## 2017-11-01 ENCOUNTER — Other Ambulatory Visit (HOSPITAL_COMMUNITY)
Admission: RE | Admit: 2017-11-01 | Discharge: 2017-11-01 | Disposition: A | Discharge status: Home / Self Care | Payer: 59 | Source: Ambulatory Visit | Attending: Family Medicine | Admitting: Family Medicine

## 2017-11-01 ENCOUNTER — Ambulatory Visit: Payer: 59 | Admitting: Family Medicine

## 2017-11-01 VITALS — BP 116/80 | HR 91 | Temp 98.0°F | Resp 16 | Ht 66.0 in | Wt 137.8 lb

## 2017-11-01 DIAGNOSIS — Z7982 Long term (current) use of aspirin: Secondary | ICD-10-CM | POA: Diagnosis not present

## 2017-11-01 DIAGNOSIS — B379 Candidiasis, unspecified: Secondary | ICD-10-CM

## 2017-11-01 DIAGNOSIS — Z880 Allergy status to penicillin: Secondary | ICD-10-CM | POA: Diagnosis not present

## 2017-11-01 DIAGNOSIS — E785 Hyperlipidemia, unspecified: Secondary | ICD-10-CM | POA: Insufficient documentation

## 2017-11-01 DIAGNOSIS — F17210 Nicotine dependence, cigarettes, uncomplicated: Secondary | ICD-10-CM | POA: Diagnosis not present

## 2017-11-01 DIAGNOSIS — Z853 Personal history of malignant neoplasm of breast: Secondary | ICD-10-CM | POA: Diagnosis not present

## 2017-11-01 DIAGNOSIS — F419 Anxiety disorder, unspecified: Secondary | ICD-10-CM | POA: Insufficient documentation

## 2017-11-01 DIAGNOSIS — Z7251 High risk heterosexual behavior: Secondary | ICD-10-CM

## 2017-11-01 DIAGNOSIS — Z823 Family history of stroke: Secondary | ICD-10-CM | POA: Diagnosis not present

## 2017-11-01 DIAGNOSIS — Z8249 Family history of ischemic heart disease and other diseases of the circulatory system: Secondary | ICD-10-CM | POA: Insufficient documentation

## 2017-11-01 DIAGNOSIS — Z79899 Other long term (current) drug therapy: Secondary | ICD-10-CM | POA: Insufficient documentation

## 2017-11-01 MED ORDER — FLUCONAZOLE 150 MG PO TABS: ORAL_TABLET | ORAL | 2 refills | Status: DC

## 2017-11-01 MED FILL — FLUCONAZOLE 150 MG TABLET: 150 | 2 days supply | Qty: 2 | Fill #0

## 2017-11-01 NOTE — Progress Notes (Signed)
Patient ID: [name], female   DOB: Feb 27, 1966, 52 y.o.   MRN: [id number]    Subjective:  I acted as a Education administrator for Dr. Carollee Herter.  Guerry Bruin, [place]   Patient ID: [name], female    DOB: [birth year], 52 y.o.   MRN: [id number]  Chief Complaint  Patient presents with  . Vaginal Discharge    HPI  Patient is in today for intermittant vaginal discharge.    Patient Care Team: Carollee Herter, Alferd Apa, DO as PCP - General (Family Medicine)   Past Medical History:  Diagnosis Date  . Anxiety   . Breast cancer [facility] 1999   2004  . Headache   . Hyperlipidemia   . Numbness     Past Surgical History:  Procedure Laterality Date  . ABDOMINAL HYSTERECTOMY  2006   TAH,BSO secondary bleeding and breast cancer--  . CORNEAL TRANSPLANT Bilateral    6x, started in 1999, last was 2006.  Marland Kitchen MASTECTOMY     bilateral---1999/2004    Family History  Problem Relation Age of Onset  . Stroke Sister 34       after starting OCP  . Hyperlipidemia Sister   . Aortic aneurysm Mother   . Aortic aneurysm Unknown   . Hypertension Unknown   . Seizures Neg Hx   . Migraines Neg Hx     Social History   Socioeconomic History  . Marital status: Single    Spouse name: Not on file  . Number of children: 1  . Years of education: 43  . Highest education level: Not on file  Social Needs  . Financial resource strain: Not on file  . Food insecurity - worry: Not on file  . Food insecurity - inability: Not on file  . Transportation needs - medical: Not on file  . Transportation needs - non-medical: Not on file  Occupational History  . Occupation: works for [employer]; office specialist  Tobacco Use  . Smoking status: Current Some Day Smoker    Packs/day: 0.25    Years: 27.00    Pack years: 6.75    Types: Cigarettes  . Smokeless tobacco: Never Used  . Tobacco comment: Trying to quit  Substance and Sexual Activity  . Alcohol use: Yes    Alcohol/week: 0.0 oz    Comment: Rare  . Drug  use: No  . Sexual activity: Not Currently  Other Topics Concern  . Not on file  Social History Narrative   Marital status: single      Children: one daughter      Lives: with sister      Employment: works for [employer]; office specialist      Education: Multimedia programmer      Tobacco: trying to quit      Alcohol: none; rare      Drugs: none      Exercise: walking daily 2 miles      Caffeine use: Tea/pepsi- 1 pepsi/morning      Tea- 3 cups per day   Right-handed    Outpatient Medications Prior to Visit  Medication Sig Dispense Refill  . ALPRAZolam (XANAX) 0.25 MG tablet Take 1 tablet (0.25 mg total) by mouth 2 (two) times daily as needed. for anxiety 60 tablet 0  . aspirin 81 MG tablet Take 1 tablet (81 mg total) by mouth daily. 30 tablet   . atorvastatin (LIPITOR) 10 MG tablet TAKE 1 TABLET BY MOUTH DAILY AT 6:00PM 90 tablet 0  .  escitalopram (LEXAPRO) 10 MG tablet Take 1 tablet (10 mg total) by mouth daily. 30 tablet 0  . nicotine polacrilex (NICORETTE) 4 MG gum Take 4 mg by mouth as needed for smoking cessation.    . prednisoLONE acetate (PRED FORTE) 1 % ophthalmic suspension SHAKE LQ AND INT 1 GTT IN OD QID  2  . meclizine (ANTIVERT) 12.5 MG tablet Take 1 tablet (12.5 mg total) by mouth 3 (three) times daily as needed for dizziness. 30 tablet 0   No facility-administered medications prior to visit.     Allergies  Allergen Reactions  . Penicillins Other (See Comments)    Childhood reaction    Review of Systems  Constitutional: Negative for fever and malaise/fatigue.  HENT: Negative for congestion.   Eyes: Negative for blurred vision.  Respiratory: Negative for cough and shortness of breath.   Cardiovascular: Negative for chest pain, palpitations and leg swelling.  Gastrointestinal: Negative for vomiting.  Musculoskeletal: Negative for back pain.  Skin: Negative for rash.  Neurological: Negative for loss of consciousness and headaches.         Objective:    Physical Exam  Constitutional: She is oriented to person, place, and time. She appears well-developed and well-nourished.  HENT:  Head: Normocephalic and atraumatic.  Eyes: Conjunctivae and EOM are normal.  Neck: Normal range of motion. Neck supple. No JVD present. Carotid bruit is not present. No thyromegaly present.  Cardiovascular: Normal rate, regular rhythm and normal heart sounds.  No murmur heard. Pulmonary/Chest: Effort normal and breath sounds normal. No respiratory distress. She has no wheezes. She has no rales. She exhibits no tenderness.  Genitourinary: Cervix exhibits discharge. Cervix exhibits no motion tenderness and no friability. No erythema, tenderness or bleeding in the vagina. No foreign body in the vagina. No signs of injury around the vagina. Vaginal discharge found.  Musculoskeletal: She exhibits no edema.  Neurological: She is alert and oriented to person, place, and time.  Psychiatric: She has a normal mood and affect. Her behavior is normal. Thought content normal.  Nursing note and vitals reviewed.   BP 116/80 (BP Location: Right Arm, Cuff Size: Normal)   Pulse 91   Temp 98 F (36.7 C) (Oral)   Resp 16   Ht 5\' 6"  (1.676 m)   Wt 137 lb 12.8 oz (62.5 kg)   SpO2 94%   BMI 22.24 kg/m  Wt Readings from Last 3 Encounters:  11/01/17 137 lb 12.8 oz (62.5 kg)  08/15/17 134 lb 3.2 oz (60.9 kg)  07/12/17 128 lb 12.8 oz (58.4 kg)   BP Readings from Last 3 Encounters:  11/01/17 116/80  08/15/17 105/77  07/12/17 118/80     Immunization History  Administered Date(s) Administered  . Influenza Split 07/03/2012  . Influenza Whole 08/16/2010  . Influenza,inj,Quad PF,6+ Mos 07/18/2013  . Td 10/09/2009    Health Maintenance  Topic Date Due  . HIV Screening  06/25/1981  . COLONOSCOPY  06/25/2016  . PAP SMEAR  07/18/2016  . INFLUENZA VACCINE  07/04/2024 (Originally 05/09/2017)  . TETANUS/TDAP  10/10/2019    Lab Results  Component Value Date    WBC 6.9 07/12/2017   HGB 16.3 (H) 07/12/2017   HCT 49.2 (H) 07/12/2017   PLT 296.0 07/12/2017   GLUCOSE 73 08/15/2017   CHOL 139 01/24/2016   TRIG 115.0 01/24/2016   HDL 30.70 (L) 01/24/2016   LDLDIRECT 141.3 07/03/2012   LDLCALC 85 01/24/2016   ALT 13 07/12/2017   AST 12 07/12/2017  NA 143 08/15/2017   K 4.5 08/15/2017   CL 105 08/15/2017   CREATININE 0.92 08/15/2017   BUN 16 08/15/2017   CO2 27 08/15/2017   TSH 1.02 08/22/2016   HGBA1C 5.6 12/02/2013   MICROALBUR <0.7 01/24/2016    Lab Results  Component Value Date   TSH 1.02 08/22/2016   Lab Results  Component Value Date   WBC 6.9 07/12/2017   HGB 16.3 (H) 07/12/2017   HCT 49.2 (H) 07/12/2017   MCV 93.2 07/12/2017   PLT 296.0 07/12/2017   Lab Results  Component Value Date   NA 143 08/15/2017   K 4.5 08/15/2017   CO2 27 08/15/2017   GLUCOSE 73 08/15/2017   BUN 16 08/15/2017   CREATININE 0.92 08/15/2017   BILITOT 0.3 07/12/2017   ALKPHOS 95 07/12/2017   AST 12 07/12/2017   ALT 13 07/12/2017   PROT 8.2 07/12/2017   ALBUMIN 4.6 07/12/2017   CALCIUM 10.0 08/15/2017   ANIONGAP 11 10/31/2015   GFR 66.63 07/12/2017   Lab Results  Component Value Date   CHOL 139 01/24/2016   Lab Results  Component Value Date   HDL 30.70 (L) 01/24/2016   Lab Results  Component Value Date   LDLCALC 85 01/24/2016   Lab Results  Component Value Date   TRIG 115.0 01/24/2016   Lab Results  Component Value Date   CHOLHDL 5 01/24/2016   Lab Results  Component Value Date   HGBA1C 5.6 12/02/2013         Assessment & Plan:   Problem List Items Addressed This Visit    None    Visit Diagnoses    Candidiasis    -  Primary   Relevant Medications   fluconazole (DIFLUCAN) 150 MG tablet   High risk heterosexual behavior       Relevant Orders   HIV antibody   HSV(herpes simplex vrs) 1+2 ab-IgG   RPR   Hepatitis, Acute   Cervicovaginal ancillary only      I have discontinued Atianna L. Standing's meclizine. I am  also having her start on fluconazole. Additionally, I am having her maintain her aspirin, nicotine polacrilex, prednisoLONE acetate, ALPRAZolam, escitalopram, and atorvastatin.  Meds ordered this encounter  Medications  . fluconazole (DIFLUCAN) 150 MG tablet    Sig: 1 po qd x1, may repeat in 3 days prn    Dispense:  2 tablet    Refill:  2    CMA served as scribe during this visit. History, Physical and Plan performed by medical provider. Documentation and orders reviewed and attested to.  [name], DO

## 2017-11-01 NOTE — Patient Instructions (Signed)
Cervicitis  Cervicitis is irritation and swelling of the cervix. The cervix is the lower and narrow end of the uterus. It is the part of the uterus that opens up to the vagina.  What are the causes?  This condition may be caused by:  · An STI (sexually transmitted infection), such as gonorrhea, chlamydia, or genital herpes.  · Objects that are put in the vagina, such as tampons or birth control devices. This usually occurs if an object is left in for too long.  · Chemical irritation or allergic reaction. This may be from vaginal douches, latex condoms, or contraceptive creams.  · An injury to the cervix.  · A bacterial infection.  · Radiation therapy.    What increases the risk?  You are more likely to develop this condition if:  · You have unprotected sex.  · You have sex with many partners.  · You have a new sexual partner.  · You start having sex at an early age.  · You have a history of STIs.    What are the signs or symptoms?  Symptoms of this condition include:  · Gray, white, yellow, or bad-smelling vaginal discharge.  · Pain or itchiness around the vagina.  · Pain during sex.  · Pain in the lower abdomen or lower back, especially during sex.  · Urinating often.  · Pain during urination.  · Abnormal vaginal bleeding, such as bleeding between periods, after sex, or after menopause.    In some cases, there are no symptoms.  How is this diagnosed?  This condition may be diagnosed with:  · A pelvic exam. Your health care provider will examine whether the cervix has an unusual discharge or bleeds easily when touched with a swab.  · A wet prep. This is a test in which vaginal discharge is examined under a microscope to check for signs of infection.  · A swab test of the cervix. For this test, sample cells from the cervix are collected on a swab and examined under a microscope to check for signs of infection.  · Urine tests.    How is this treated?  Treatment for cervicitis depends on what is causing the condition.  Treatment may include:  · Antibiotic medicines. These are used to treat certain infections, including STIs like gonorrhea or chlamydia. If you are taking these medicines to treat an STI, your sexual partner may also need to take these medicines.  · Antiviral medicines. These are used to treat herpes simplex virus. Your sexual partner may also need to take these medicines.  · Stopping use of items that cause irritation, such as tampons, latex condoms, douches, or spermicides.    Follow these instructions at home:  · Do not have sex until your health care provider says it is okay.  · Take over-the-counter and prescription medicines only as told by your health care provider.  · If you were prescribed an antibiotic, take it as told by your health care provider. Do not stop taking the antibiotic even if you start to feel better.  · Keep all follow-up visits as told by your health care provider. This is important.  Contact a health care provider if:  · Your symptoms come back or get worse after treatment.  · You have a fever.  · You have fatigue.  · You have pain in your abdomen.  · You experience nausea, vomiting, or diarrhea.  · You have back pain.  Get help right away if:  ·   You have severe abdominal pain that cannot be helped with medicine.  · You cannot urinate.  Summary  · Cervicitis is irritation and swelling of the cervix.  · This condition may be caused by an STI (sexually transmitted infection), an allergic reaction or chemical irritation, radiation therapy, or objects that are put in the vagina, such as tampons or diaphragms.  · Symptoms of this condition can include unusual vaginal discharge, painful urination, irritation or pain around the vagina, bleeding between periods or after sex, and pain during sex.  · You are more likely to develop this condition if you have unprotected sex, have many sexual partners, or have a history of STIs.  · This condition may be treated with antibiotic or antiviral medicines or  by stopping use of items that cause irritation.  This information is not intended to replace advice given to you by your health care provider. Make sure you discuss any questions you have with your health care provider.  Document Released: 09/25/2005 Document Revised: 06/10/2016 Document Reviewed: 06/10/2016  Elsevier Interactive Patient Education © 2017 [employer]vier [employer]

## 2017-11-02 LAB — CERVICOVAGINAL ANCILLARY ONLY
BACTERIAL VAGINITIS: NEGATIVE
CANDIDA VAGINITIS: NEGATIVE
Chlamydia: NEGATIVE
Neisseria Gonorrhea: NEGATIVE
Trichomonas: NEGATIVE

## 2017-11-05 LAB — HEPATITIS PANEL, ACUTE
HEP B C IGM: NONREACTIVE
Hep A IgM: NONREACTIVE
Hepatitis B Surface Ag: NONREACTIVE
Hepatitis C Ab: NONREACTIVE
SIGNAL TO CUT-OFF: 0.02 (ref <1.00)

## 2017-11-05 LAB — RPR: RPR Ser Ql: REACTIVE — AB

## 2017-11-05 LAB — FLUORESCENT TREPONEMAL AB(FTA)-IGG-BLD: FLUORESCENT TREPONEMAL ABS: REACTIVE — AB

## 2017-11-05 LAB — HSV(HERPES SIMPLEX VRS) I + II AB-IGG: HSV 2 IGG, TYPE SPEC: 12.7 {index} — AB

## 2017-11-05 LAB — RPR TITER: RPR Titer: 1:8 {titer} — ABNORMAL HIGH

## 2017-11-05 LAB — HIV ANTIBODY (ROUTINE TESTING W REFLEX): HIV 1&2 Ab, 4th Generation: NONREACTIVE

## 2017-11-06 ENCOUNTER — Encounter: Payer: Self-pay | Admitting: *Deleted

## 2017-11-06 ENCOUNTER — Other Ambulatory Visit: Payer: Self-pay | Admitting: *Deleted

## 2017-11-06 MED ORDER — VALACYCLOVIR HCL 1 G PO TABS: 1000.0000 mg | ORAL_TABLET | Freq: Every day | ORAL | 5 refills | Status: DC

## 2017-11-07 ENCOUNTER — Ambulatory Visit (INDEPENDENT_AMBULATORY_CARE_PROVIDER_SITE_OTHER): Payer: 59

## 2017-11-07 DIAGNOSIS — H04123 Dry eye syndrome of bilateral lacrimal glands: Secondary | ICD-10-CM | POA: Diagnosis not present

## 2017-11-07 DIAGNOSIS — Z947 Corneal transplant status: Secondary | ICD-10-CM | POA: Diagnosis not present

## 2017-11-07 DIAGNOSIS — A539 Syphilis, unspecified: Secondary | ICD-10-CM | POA: Diagnosis not present

## 2017-11-07 DIAGNOSIS — H2513 Age-related nuclear cataract, bilateral: Secondary | ICD-10-CM | POA: Diagnosis not present

## 2017-11-07 MED ORDER — PENICILLIN G BENZATHINE 1200000 UNIT/2ML IM SUSP
1.2000 10*6.[IU] | Freq: Once | INTRAMUSCULAR | Status: AC
Start: 2017-11-07 — End: 2017-11-07
  Administered 2017-11-07: 1.2 10*6.[IU] via INTRAMUSCULAR

## 2017-11-07 NOTE — Progress Notes (Signed)
Pre visit review using our clinic tool,if applicable. No additional management support is needed unless otherwise documented below in the visit note.   Patient in for Bicillin L-A injections per order from Dr. Lizbeth Bark -Chase due to patient being recently diagnosed with Syphillis. Patient to have 3 visits.  Given  2 (1.2 unit) injections. Patient tolerated well.  Patient scheduled next to  Injections at front desk.

## 2017-11-09 ENCOUNTER — Telehealth: Payer: Self-pay | Admitting: Family Medicine

## 2017-11-09 NOTE — Telephone Encounter (Unsigned)
Copied from [place] [id number]. Topic: General - Other >> Nov 09, 2017  3:44 PM Neva Seat wrote: [place] [phone]  Needing to discuss pt's positive syphilis test

## 2017-11-09 NOTE — Telephone Encounter (Signed)
Spoke with Georgina Snell and gave info needed

## 2017-11-14 ENCOUNTER — Ambulatory Visit (INDEPENDENT_AMBULATORY_CARE_PROVIDER_SITE_OTHER): Payer: 59

## 2017-11-14 ENCOUNTER — Telehealth: Payer: Self-pay | Admitting: *Deleted

## 2017-11-14 DIAGNOSIS — Z947 Corneal transplant status: Secondary | ICD-10-CM | POA: Diagnosis not present

## 2017-11-14 DIAGNOSIS — H16103 Unspecified superficial keratitis, bilateral: Secondary | ICD-10-CM | POA: Diagnosis not present

## 2017-11-14 DIAGNOSIS — A539 Syphilis, unspecified: Secondary | ICD-10-CM

## 2017-11-14 DIAGNOSIS — H01021 Squamous blepharitis right upper eyelid: Secondary | ICD-10-CM | POA: Diagnosis not present

## 2017-11-14 MED ORDER — PENICILLIN G BENZATHINE 1200000 UNIT/2ML IM SUSP
2.4000 10*6.[IU] | INTRAMUSCULAR | Status: DC
  Administered 2017-11-14: 2.4 10*6.[IU] via INTRAMUSCULAR

## 2017-11-14 NOTE — Telephone Encounter (Signed)
Can they be broken in half?  She can take 500 mg daily

## 2017-11-14 NOTE — Telephone Encounter (Signed)
Patient notified she will take half

## 2017-11-14 NOTE — Telephone Encounter (Signed)
Patient came into office, she states that since she started on the valtrex she has had a headache (she says feels like a headache on chemo) and red spots coming on her arms.  She feels very tired.  She said that she cannot take this medication every day.  She would be willing to take every other day.  Would you like for her to stop and just take this symptomatically.   She is still asymptomatic.

## 2017-11-14 NOTE — Progress Notes (Signed)
Patient in for Bicillin L-A injections per order from Dr. Lizbeth Bark -Chase due to patient being recently diagnosed with Syphillis. Patient to have 3 visits. One remaining after today's visit  Given  2 (1.2 unit) injections and after waiting 15-20 minutes in the lobby patient seemed to have tolerated it well.  Patient states she's scheduled to come in next Wednesday at 0900 for follow up dose

## 2017-11-21 ENCOUNTER — Ambulatory Visit (INDEPENDENT_AMBULATORY_CARE_PROVIDER_SITE_OTHER): Payer: 59

## 2017-11-21 DIAGNOSIS — A539 Syphilis, unspecified: Secondary | ICD-10-CM

## 2017-11-21 MED ORDER — PENICILLIN G BENZATHINE 1200000 UNIT/2ML IM SUSP
1.2000 10*6.[IU] | Freq: Once | INTRAMUSCULAR | Status: AC
  Administered 2017-11-21: 1.2 10*6.[IU] via INTRAMUSCULAR

## 2017-11-21 NOTE — Progress Notes (Signed)
Pt comes in today for her 3rd round of bicillin injections. Pt asked what should she do or expect next, should she follow up or be retested to make sure "everything is cleared up". Dr.Lowne is not in today to advise of any of this so I told the Pt I would route the conversation to provider and follow up with her at another time.  Pt received bilateral ventrogluteal injection of 1.2 billion units of bicillin per injection site. Pt tolerated injections well and will follow up as needed

## 2017-12-05 DIAGNOSIS — H01021 Squamous blepharitis right upper eyelid: Secondary | ICD-10-CM | POA: Diagnosis not present

## 2017-12-05 DIAGNOSIS — H16103 Unspecified superficial keratitis, bilateral: Secondary | ICD-10-CM | POA: Diagnosis not present

## 2017-12-05 DIAGNOSIS — Z947 Corneal transplant status: Secondary | ICD-10-CM | POA: Diagnosis not present

## 2018-01-16 DIAGNOSIS — Z947 Corneal transplant status: Secondary | ICD-10-CM | POA: Diagnosis not present

## 2018-01-16 DIAGNOSIS — H16223 Keratoconjunctivitis sicca, not specified as Sjogren's, bilateral: Secondary | ICD-10-CM | POA: Diagnosis not present

## 2018-01-30 ENCOUNTER — Telehealth: Payer: Self-pay | Admitting: *Deleted

## 2018-01-30 DIAGNOSIS — H01022 Squamous blepharitis right lower eyelid: Secondary | ICD-10-CM | POA: Diagnosis not present

## 2018-01-30 DIAGNOSIS — H01024 Squamous blepharitis left upper eyelid: Secondary | ICD-10-CM | POA: Diagnosis not present

## 2018-01-30 DIAGNOSIS — H01021 Squamous blepharitis right upper eyelid: Secondary | ICD-10-CM | POA: Diagnosis not present

## 2018-01-30 IMAGING — MR MR ANKLE*R* W/O CM
4 of 5 series · 33 of 40 positions shown · non-contrast
Comparison: 01/14/2008

CLINICAL DATA: Right ankle pain instability. History of prior ankle
sprains. Right ankle pain.

EXAM:
MRI OF THE RIGHT ANKLE WITHOUT CONTRAST
TECHNIQUE: Multiplanar, multisequence MR imaging of the ankle was performed. No
intravenous contrast was administered.

[Series 4: T2 fat-sat · axial · 3.0mm · 0.47mm/px · z∈[-71,+42]mm · 9 of 30 slices shown (1 of 3)]
[im 1/30]
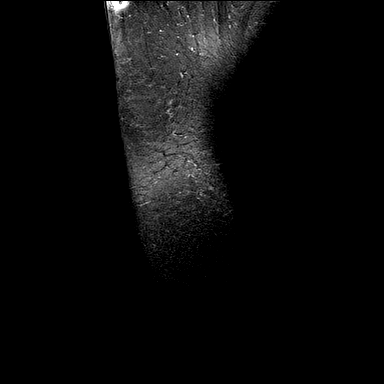
[im 4/30]
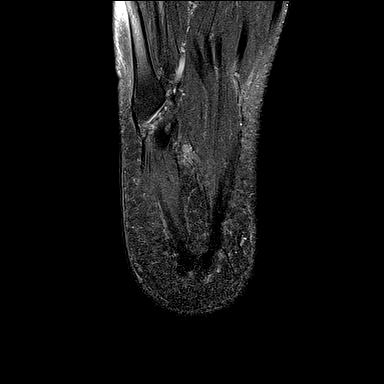
[im 8/30]
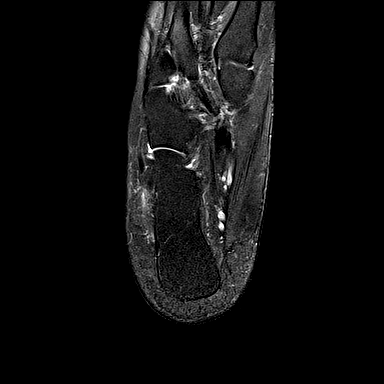
[im 11/30]
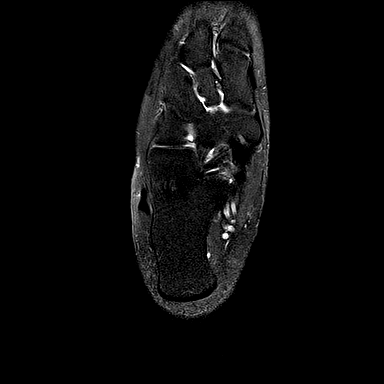
[im 15/30]
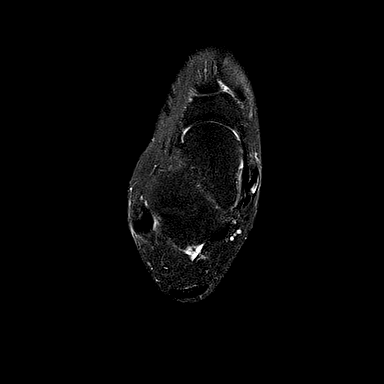
[im 19/30]
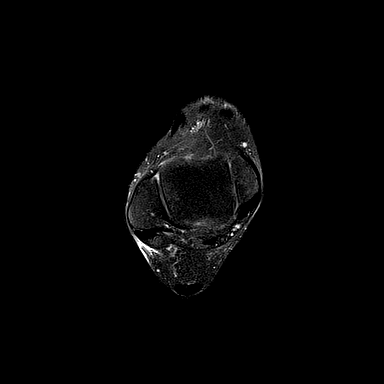
[im 22/30]
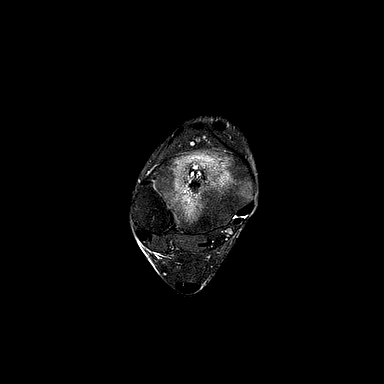
[im 26/30]
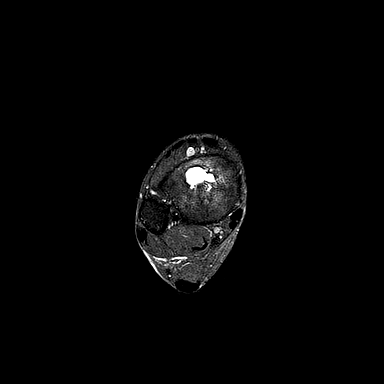
[im 30/30]
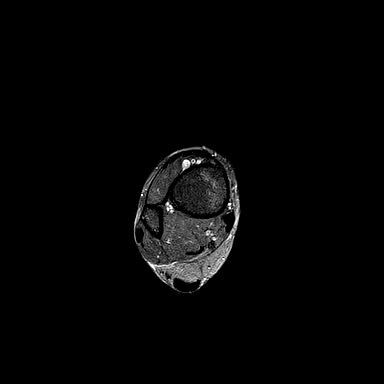

[Series 5: PD fat-sat · axial · 3.0mm · 0.47mm/px · z∈[-71,+42]mm · 9 of 30 slices shown]
[im 1/30]
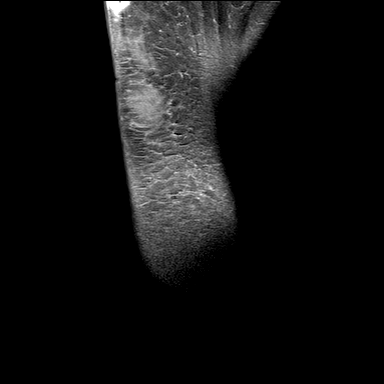
[im 4/30]
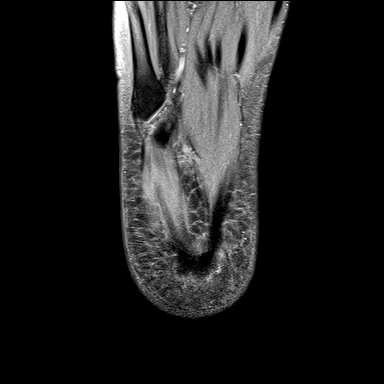
[im 8/30]
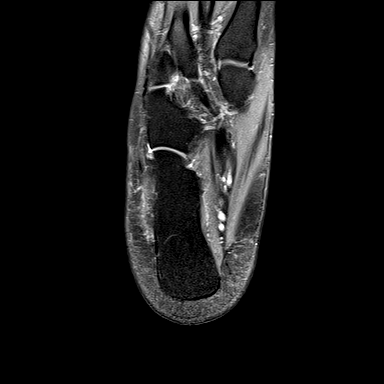
[im 11/30]
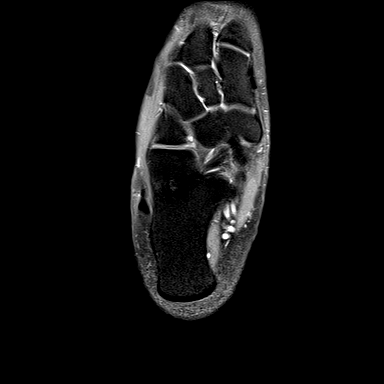
[im 15/30]
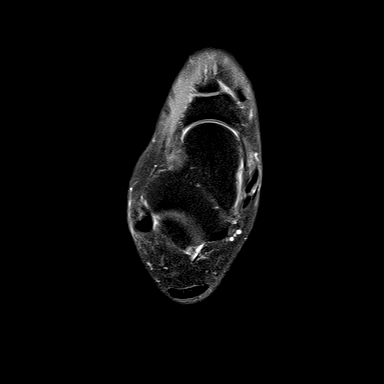
[im 19/30]
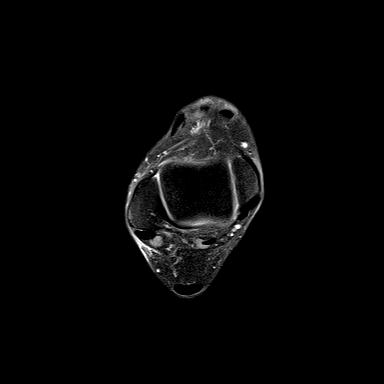
[im 22/30]
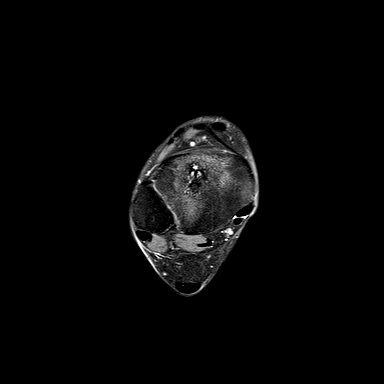
[im 26/30]
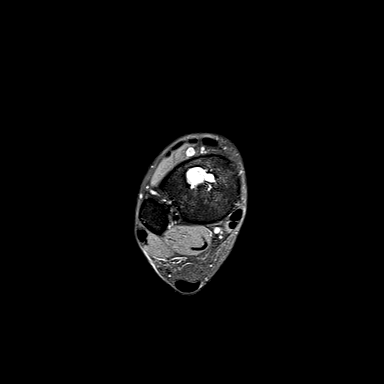
[im 30/30]
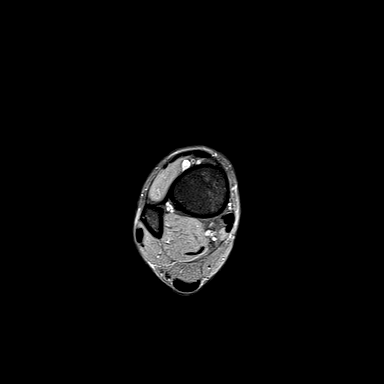

[Series 7: T2 fat-sat · sagittal · 3.0mm · 0.47mm/px · 7 of 22 slices shown (2 of 3)]
[im 1/22]
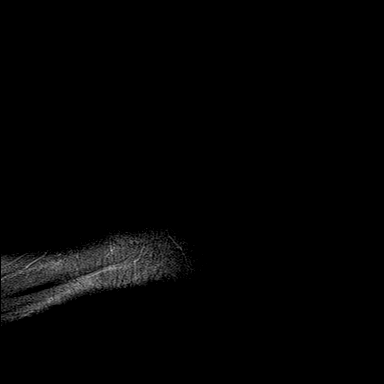
[im 4/22]
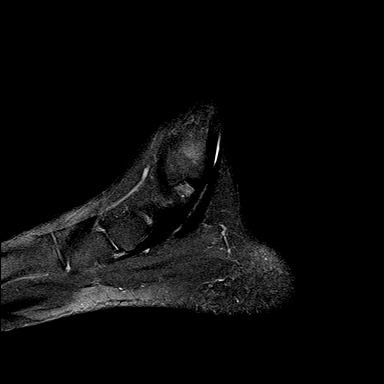
[im 8/22]
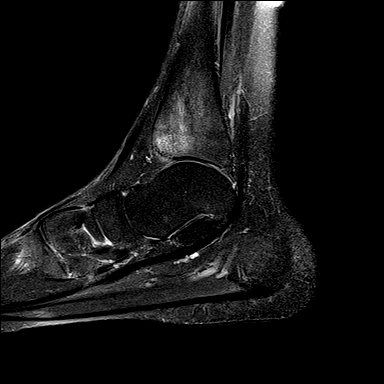
[im 11/22]
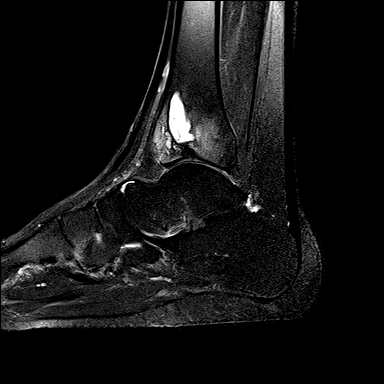
[im 15/22]
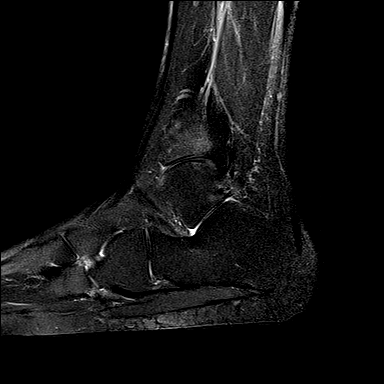
[im 18/22]
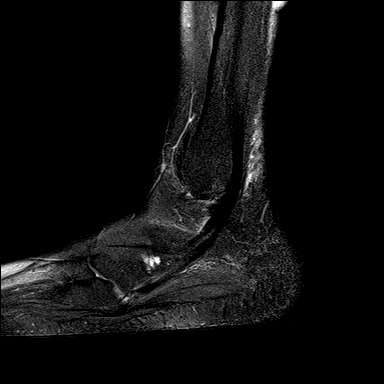
[im 22/22]
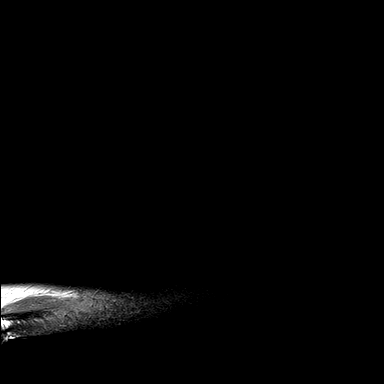

[Series 8: T2 fat-sat · coronal · 3.5mm · 0.47mm/px · 8 of 28 slices shown (3 of 3)]
[im 1/28]
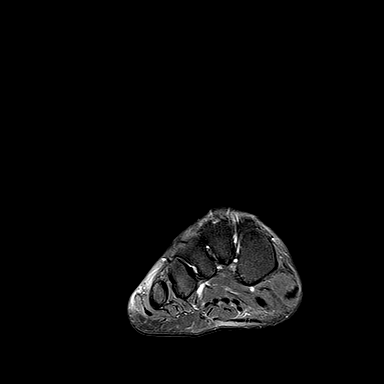
[im 4/28]
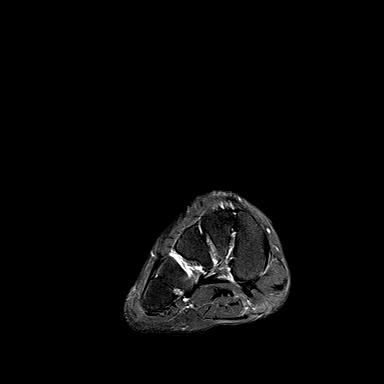
[im 8/28]
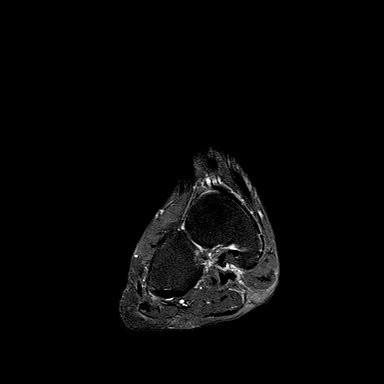
[im 12/28]
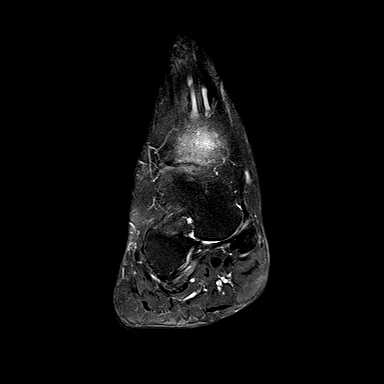
[im 16/28]
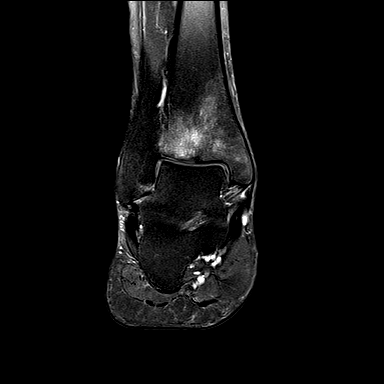
[im 20/28]
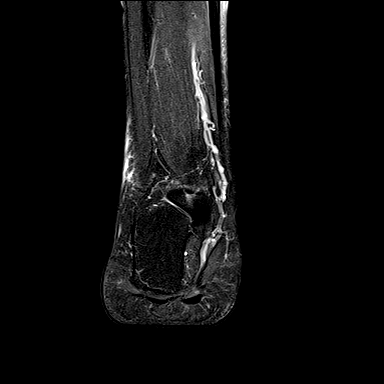
[im 24/28]
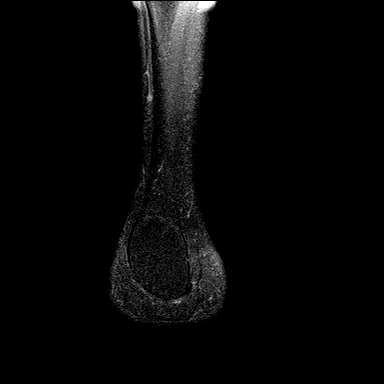
[im 28/28]
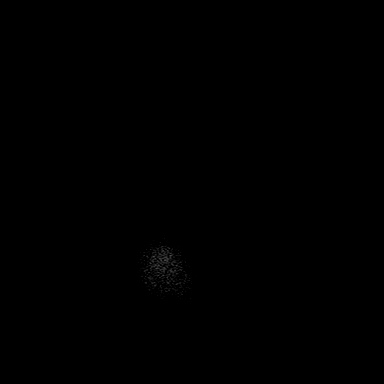

[33 of 40 positions shown; findings below may reference images not displayed]

FINDINGS: TENDONS

Peroneal: Unremarkable

Posteromedial: Distal tibialis posterior tenosynovitis and distal
tendinopathy.

Anterior: Unremarkable

Achilles: Unremarkable

Plantar Fascia: Unremarkable

LIGAMENTS

Lateral: Thin/attenuated anterior talofibular ligament.

Medial: Unremarkable.  Also the Lisfranc ligament appears intact.

CARTILAGE

Ankle Joint: 6 mm osteochondral lesion of the central tibial plafond
on image [DATE] with a cortical divot, underlying 2.5 by 1.1 by 1.5 cm
cystic lesion with thin corticated margins, surrounding marrow
edema, and several other small surrounding cystic lesions as on
image [DATE]. This is fairly similar to the appearance on 01/14/2008
at which time there was concern for Inocentia Krog abscess. Presumably
this could also represent an osteochondral lesion of the tibial
plafond with geode, although the degree of marrow edema is fairly
notable. The lack of progression seems to argue against this being
related to the patient' s prior breast cancer. There is no
tibiotalar joint effusion.

Small non-fragmented osteochondral lesion of the lateral talar dome
on image [DATE].

Subtalar Joints/Sinus Tarsi: Unremarkable

Bones: No additional significant bony findings.

Other: No supplemental non-categorized findings.
IMPRESSION: 1. The dominant finding is a 6 mm osteochondral lesion of the
central tibial plafond with cortical divot, surrounding large cystic
lesion, and surrounding marrow edema. This is fairly similar in
appearance to 01/14/2008, at which time there is concern for Carino
Bai abscess. The lack of change from that time raises suspicion
that this may be a geode. There surrounding marrow edema which may
reflect an active process but which was also present in 0338.
2. Small non-fragmented osteochondral lesion of the lateral talar
dome.
3. Distal tibialis posterior tenosynovitis and tendinopathy,
correlate clinically in assessing for tibialis posterior
dysfunction.
4. Attenuated anterior talofibular ligament, probably reflecting a
remote injury.

## 2018-01-30 NOTE — Telephone Encounter (Signed)
Pharmacy [facility] faxed over a drug change request.  Valtrex is not covered by ins.  The preferred alternative is acyclovir.  Please advise.

## 2018-01-31 MED ORDER — ACYCLOVIR 400 MG PO TABS: 400.0000 mg | ORAL_TABLET | Freq: Two times a day (BID) | ORAL | 3 refills | Status: DC

## 2018-01-31 NOTE — Telephone Encounter (Signed)
Acyclovir 400 mg bid  #180   3 refills

## 2018-01-31 NOTE — Telephone Encounter (Signed)
rx sent to [facility]

## 2018-03-24 ENCOUNTER — Other Ambulatory Visit: Payer: Self-pay | Admitting: Family Medicine

## 2018-04-26 NOTE — Progress Notes (Deleted)
[name] [address], [place] [address] Phone: [phone] Subjective:    I'm seeing this patient by the request  of:    CC:   [id number]  [name] is a 52 y.o. female coming in with complaint of ***  Onset-  Location Duration-  Character- Aggravating factors- Reliving factors-  Therapies tried-  Severity-     Past Medical History:  Diagnosis Date  . Anxiety   . Breast cancer [facility]) 1999   2004  . Headache   . Hyperlipidemia   . Numbness    Past Surgical History:  Procedure Laterality Date  . ABDOMINAL HYSTERECTOMY  2006   TAH,BSO secondary bleeding and breast cancer--  . CORNEAL TRANSPLANT Bilateral    6x, started in 1999, last was 2006.  Marland Kitchen MASTECTOMY     bilateral---1999/2004   Social History   Socioeconomic History  . Marital status: Single    Spouse name: Not on file  . Number of children: 1  . Years of education: 2  . Highest education level: Not on file  Occupational History  . Occupation: works for [employer]; office specialist  Social Needs  . Financial resource strain: Not on file  . Food insecurity:    Worry: Not on file    Inability: Not on file  . Transportation needs:    Medical: Not on file    Non-medical: Not on file  Tobacco Use  . Smoking status: Current Some Day Smoker    Packs/day: 0.25    Years: 27.00    Pack years: 6.75    Types: Cigarettes  . Smokeless tobacco: Never Used  . Tobacco comment: Trying to quit  Substance and Sexual Activity  . Alcohol use: Yes    Alcohol/week: 0.0 oz    Comment: Rare  . Drug use: No  . Sexual activity: Not Currently  Lifestyle  . Physical activity:    Days per week: Not on file    Minutes per session: Not on file  . Stress: Not on file  Relationships  . Social connections:    Talks on phone: Not on file    Gets together: Not on file    Attends religious service: Not on file    Active member of club or organization: Not on  file    Attends meetings of clubs or organizations: Not on file    Relationship status: Not on file  Other Topics Concern  . Not on file  Social History Narrative   Marital status: single      Children: one daughter      Lives: with sister      Employment: works for [employer]; office specialist      Education: Multimedia programmer      Tobacco: trying to quit      Alcohol: none; rare      Drugs: none      Exercise: walking daily 2 miles      Caffeine use: Tea/pepsi- 1 pepsi/morning      Tea- 3 cups per day   Right-handed   Allergies  Allergen Reactions  . Penicillins Other (See Comments)    Childhood reaction   Family History  Problem Relation Age of Onset  . Stroke Sister 55       after starting OCP  . Hyperlipidemia Sister   . Aortic aneurysm Mother   . Aortic aneurysm Unknown   . Hypertension Unknown   .  Seizures Neg Hx   . Migraines Neg Hx      Past medical history, social, surgical and family history all reviewed in electronic medical record.  No pertanent information unless stated regarding to the chief complaint.   Review of Systems:Review of systems updated and as accurate as of 04/26/18  No headache, visual changes, nausea, vomiting, diarrhea, constipation, dizziness, abdominal pain, skin rash, fevers, chills, night sweats, weight loss, swollen lymph nodes, body aches, joint swelling, muscle aches, chest pain, shortness of breath, mood changes.   Objective  There were no vitals taken for this visit. Systems examined below as of 04/26/18   General: No apparent distress alert and oriented x3 mood and affect normal, dressed appropriately.  HEENT: Pupils equal, extraocular movements intact  Respiratory: Patient's speak in full sentences and does not appear short of breath  Cardiovascular: No lower extremity edema, non tender, no erythema  Skin: Warm dry intact with no signs of infection or rash on extremities or on axial skeleton.  Abdomen:  Soft nontender  Neuro: Cranial nerves II through XII are intact, neurovascularly intact in all extremities with 2+ DTRs and 2+ pulses.  Lymph: No lymphadenopathy of posterior or anterior cervical chain or axillae bilaterally.  Gait normal with good balance and coordination.  MSK:  Non tender with full range of motion and good stability and symmetric strength and tone of shoulders, elbows, wrist, hip, knee and ankles bilaterally.     Impression and Recommendations:     This case required medical decision making of moderate complexity.      Note: This dictation was prepared with Dragon dictation along with smaller phrase technology. Any transcriptional errors that result from this process are unintentional.

## 2018-04-27 ENCOUNTER — Other Ambulatory Visit: Payer: Self-pay

## 2018-04-27 ENCOUNTER — Other Ambulatory Visit: Payer: Self-pay | Admitting: Physician Assistant

## 2018-04-27 ENCOUNTER — Ambulatory Visit: Payer: 59 | Admitting: Physician Assistant

## 2018-04-27 ENCOUNTER — Encounter: Payer: Self-pay | Admitting: Physician Assistant

## 2018-04-27 VITALS — BP 120/80 | HR 90 | Temp 98.3°F | Resp 16 | Ht 66.0 in | Wt 142.2 lb

## 2018-04-27 DIAGNOSIS — T783XXA Angioneurotic edema, initial encounter: Secondary | ICD-10-CM

## 2018-04-27 MED ORDER — METHYLPREDNISOLONE SODIUM SUCC 125 MG IJ SOLR: 125.0000 mg | Freq: Once | INTRAMUSCULAR | Status: DC

## 2018-04-27 MED ORDER — CETIRIZINE HCL 10 MG PO TABS: 10.0000 mg | ORAL_TABLET | Freq: Every day | ORAL | 0 refills | Status: DC

## 2018-04-27 MED ORDER — RANITIDINE HCL 150 MG PO TABS: 150.0000 mg | ORAL_TABLET | Freq: Two times a day (BID) | ORAL | 0 refills | Status: DC

## 2018-04-27 MED ORDER — METHYLPREDNISOLONE SODIUM SUCC 125 MG IJ SOLR
125.0000 mg | Freq: Once | INTRAMUSCULAR | Status: AC
  Administered 2018-04-27: 125 mg via INTRAMUSCULAR

## 2018-04-27 MED ORDER — RANITIDINE HCL 150 MG PO TABS
300.0000 mg | ORAL_TABLET | Freq: Once | ORAL | Status: AC
  Administered 2018-04-27: 300 mg via ORAL

## 2018-04-27 NOTE — Patient Instructions (Addendum)
Today we gave you a steroid injection to help with the injection.  The following medications you will take until your lip stops swelling and for the next 3 days. Zyrtec 10mg  - daily at night Zantac 150mg  - 1 pill 2x/day  Benadryl - 25mg  every 4 hours to help with the swelling - this may make you sleepy  Ice to the area -   IF you received an x-ray today, you will receive an invoice from [facility]. Please contact [facility] at [phone] with questions or concerns regarding your invoice.   IF you received labwork today, you will receive an invoice from [facility]. Please contact [facility] at [phone] with questions or concerns regarding your invoice.   Our billing staff will not be able to assist you with questions regarding bills from these companies.  You will be contacted with the lab results as soon as they are available. The fastest way to get your results is to activate your My Chart account. Instructions are located on the last page of this paperwork. If you have not heard from [place] regarding the results in 2 weeks, please contact this office.

## 2018-04-27 NOTE — Progress Notes (Signed)
[name]  MRN: [id number] DOB: 20-Jun-1966  PCP: [name], DO  Chief Complaint  Patient presents with  . Edema    lip - bottom LEFT side 04/26/18 at night    Subjective:  Pt presents to clinic for left lower lip swolling that started last night.  She used an ice pack but when she woke up this am and the swelling was much worse.  She has had no injury to the area.  There is no pain involved.  No teeth pain.  No new foods or exposures known.  History is obtained by patient.  Review of Systems  HENT: Negative for sore throat and trouble swallowing.   Respiratory: Negative for cough and shortness of breath.     Patient Active Problem List   Diagnosis Date Noted  . Lightheaded 08/14/2016  . 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 Generalized anxiety disorder 11/02/2015  . Facial droop 12/01/2013  . Sensory disturbance 12/01/2013  . Trichomonas contact, treated 08/05/2013  . UTI 11/22/2010  . FLANK PAIN, LEFT 11/22/2010  . TOBACCO USE 10/11/2010  . BREAST CANCER, HX OF 10/11/2010    Current Outpatient Medications on File Prior to Visit  Medication Sig Dispense Refill  . acyclovir (ZOVIRAX) 400 MG tablet Take 1 tablet (400 mg total) by mouth 2 (two) times daily. 180 tablet 3  . ALPRAZolam (XANAX) 0.25 MG tablet Take 1 tablet (0.25 mg total) by mouth 2 (two) times daily as needed. for anxiety 60 tablet 0  . aspirin 81 MG tablet Take 1 tablet (81 mg total) by mouth daily. 30 tablet   . atorvastatin (LIPITOR) 10 MG tablet TAKE 1 TABLET BY MOUTH DAILY AT 6:00PM 90 tablet 0  . nicotine polacrilex (NICORETTE) 4 MG gum Take 4 mg by mouth as needed for smoking cessation.     No current facility-administered medications on file prior to visit.     Allergies  Allergen Reactions  . Penicillins Other (See Comments)    Childhood reaction    Past Medical History:  Diagnosis Date  . Anxiety   . Breast cancer [facility]) 1999   2004  . Headache   . Hyperlipidemia    . Numbness    Social History   Social History Narrative   Marital status: single      Children: one daughter      Lives: with sister      Employment: works for [employer]; office specialist      Education: Multimedia programmer      Tobacco: trying to quit      Alcohol: none; rare      Drugs: none      Exercise: walking daily 2 miles      Caffeine use: Tea/pepsi- 1 pepsi/morning      Tea- 3 cups per day   Right-handed   Social History   Tobacco Use  . Smoking status: Current Some Day Smoker    Packs/day: 0.25    Years: 27.00    Pack years: 6.75    Types: Cigarettes  . Smokeless tobacco: Never Used  . Tobacco comment: Trying to quit  Substance Use Topics  . Alcohol use: Yes    Alcohol/week: 0.0 oz    Comment: Rare  . Drug use: No   family history includes Aortic aneurysm in her mother and unknown relative; Hyperlipidemia in her sister; Hypertension in her unknown relative; Stroke (age of onset: 7) in her sister.  Objective:  BP 120/80 (BP Location: Right Arm)   Pulse 90   Temp 98.3 F (36.8 C) (Oral)   Resp 16   Ht 5\' 6"  (1.676 m)   Wt 142 lb 3.2 oz (64.5 kg)   SpO2 100%   BMI 22.95 kg/m  Body mass index is 22.95 kg/m.  Wt Readings from Last 3 Encounters:  04/27/18 142 lb 3.2 oz (64.5 kg)  11/01/17 137 lb 12.8 oz (62.5 kg)  08/15/17 134 lb 3.2 oz (60.9 kg)    Physical Exam  Constitutional: She is oriented to person, place, and time. She appears well-developed and well-nourished.  HENT:  Head: Normocephalic and atraumatic.  Right Ear: Hearing and external ear normal.  Left Ear: Hearing and external ear normal.  Mouth/Throat:    Eyes: Conjunctivae are normal.  Neck: Normal range of motion.  Cardiovascular: Normal rate, regular rhythm and normal heart sounds.  No murmur heard. Pulmonary/Chest: Effort normal and breath sounds normal. She has no wheezes.  Neurological: She is alert and oriented to person, place, and time.    Skin: Skin is warm and dry.  Psychiatric: She has a normal mood and affect. Her behavior is normal. Judgment and thought content normal.  Vitals reviewed.   Assessment and Plan :  Angioedema of lips, initial encounter - Plan: ranitidine (ZANTAC) 150 MG tablet, cetirizine (ZYRTEC) 10 MG tablet, ranitidine (ZANTAC) tablet 300 mg, methylPREDNISolone sodium succinate (SOLU-MEDROL) 125 mg/2 mL injection 125 mg, DISCONTINUED: methylPREDNISolone sodium succinate (SOLU-MEDROL) 125 mg/2 mL injection 125 mg - pt is not on lisinopril and she did not take any.  There seems to be no cause of this and it seems to be getting better from this am when she woke.  Precautions given to patient.  D/w Dr Pamella Pert  Patient verbalized to me that they understand the following: diagnosis, what is being done for them, what to expect and what should be done at home.  Their questions have been answered.  See after visit summary for patient specific instructions.  Windell Hummingbird PA-C  [facility] 04/30/2018 1:18 PM  Please note: Portions of this report may have been transcribed using dragon voice recognition software. Every effort was made to ensure accuracy; however, inadvertent computerized transcription errors may be present.

## 2018-04-29 ENCOUNTER — Encounter

## 2018-04-29 ENCOUNTER — Ambulatory Visit: Payer: 59 | Admitting: Family Medicine

## 2018-04-29 DIAGNOSIS — Z0289 Encounter for other administrative examinations: Secondary | ICD-10-CM

## 2018-04-30 ENCOUNTER — Encounter: Payer: Self-pay | Admitting: Physician Assistant

## 2018-05-22 DIAGNOSIS — H01024 Squamous blepharitis left upper eyelid: Secondary | ICD-10-CM | POA: Diagnosis not present

## 2018-05-22 DIAGNOSIS — H01021 Squamous blepharitis right upper eyelid: Secondary | ICD-10-CM | POA: Diagnosis not present

## 2018-05-22 DIAGNOSIS — H01022 Squamous blepharitis right lower eyelid: Secondary | ICD-10-CM | POA: Diagnosis not present

## 2018-06-09 NOTE — Progress Notes (Signed)
[name] [name], [name] Phone: [phone] Subjective:    I'm seeing this patient by the request  of:    I [name] am serving as a scribe for Dr. Hulan Saas.  CC: Bilateral ankle pain right greater than left  OAC:[id number]  [name] is a 52 y.o. female coming in with complaint of bilateral ankle pain. Right is worse than left. Has had PT, injections, cast etc. Was even told to have bone cancer. Ankle pops and then swells up really bad. Walks on her toes. Walking with a limp today. Has a right ankle brace. Knee on the left side is painful. When it pop it pops in various locations. Pain radiates up to the hip. Ankle is weak. ROM is good even when it is painful. Ankles have never been broken. Past weekend ankle was swollen. Struggles with wearing shoes. Running makes her ankles give out. No numbness and tingling noted.  Patient states that the swelling seems to come and go even within the day.  Onset- 20 years ago Location- Achilles, talar dome, medial Duration-can last days to minutes Character-severe pain Aggravating factors- Reliving factors-  Therapies tried- PT, injections, ice, heat, massage Severity-9 out of 10 when it occurs  Patient has had an MRI from September 2017.  There was concern for a possible Brodie's abscess but no significant change from MRI of the year previously.  I believe that it seems to be more of an OCD     Past Medical History:  Diagnosis Date  . Anxiety   . Breast cancer [facility]) 1999   2004  . Headache   . Hyperlipidemia   . Numbness    Past Surgical History:  Procedure Laterality Date  . ABDOMINAL HYSTERECTOMY  2006   TAH,BSO secondary bleeding and breast cancer--  . BREAST SURGERY    . CORNEAL TRANSPLANT Bilateral    6x, started in 1999, last was 2006.  Marland Kitchen EYE SURGERY    . MASTECTOMY     bilateral---1999/2004   Social History   Socioeconomic History  . Marital status:  Single    Spouse name: Not on file  . Number of children: 1  . Years of education: 39  . Highest education level: Not on file  Occupational History  . Occupation: works for [employer]; office specialist  Social Needs  . Financial resource strain: Not on file  . Food insecurity:    Worry: Not on file    Inability: Not on file  . Transportation needs:    Medical: Not on file    Non-medical: Not on file  Tobacco Use  . Smoking status: Current Some Day Smoker    Packs/day: 0.25    Years: 27.00    Pack years: 6.75    Types: Cigarettes  . Smokeless tobacco: Never Used  . Tobacco comment: Trying to quit  Substance and Sexual Activity  . Alcohol use: Yes    Alcohol/week: 0.0 standard drinks    Comment: Rare  . Drug use: No  . Sexual activity: Not Currently  Lifestyle  . Physical activity:    Days per week: Not on file    Minutes per session: Not on file  . Stress: Not on file  Relationships  . Social connections:    Talks on phone: Not on file    Gets together: Not on file    Attends religious service: Not on file    Active member  of club or organization: Not on file    Attends meetings of clubs or organizations: Not on file    Relationship status: Not on file  Other Topics Concern  . Not on file  Social History Narrative   Marital status: single      Children: one daughter      Lives: with sister      Employment: works for [employer]; office specialist      Education: Multimedia programmer      Tobacco: trying to quit      Alcohol: none; rare      Drugs: none      Exercise: walking daily 2 miles      Caffeine use: Tea/pepsi- 1 pepsi/morning      Tea- 3 cups per day   Right-handed   Allergies  Allergen Reactions  . Penicillins Other (See Comments)    Childhood reaction   Family History  Problem Relation Age of Onset  . Stroke Sister 81       after starting OCP  . Hyperlipidemia Sister   . Aortic aneurysm Mother   . Aortic  aneurysm Unknown   . Hypertension Unknown   . Seizures Neg Hx   . Migraines Neg Hx      Current Outpatient Medications (Cardiovascular):  .  atorvastatin (LIPITOR) 10 MG tablet, TAKE 1 TABLET BY MOUTH DAILY AT 6:00PM  Current Outpatient Medications (Respiratory):  .  cetirizine (ZYRTEC) 10 MG tablet, Take 1 tablet (10 mg total) by mouth daily.  Current Outpatient Medications (Analgesics):  .  aspirin 81 MG tablet, Take 1 tablet (81 mg total) by mouth daily.   Current Outpatient Medications (Other):  .  acyclovir (ZOVIRAX) 400 MG tablet, Take 1 tablet (400 mg total) by mouth 2 (two) times daily. Marland Kitchen  ALPRAZolam (XANAX) 0.25 MG tablet, Take 1 tablet (0.25 mg total) by mouth 2 (two) times daily as needed. for anxiety .  nicotine polacrilex (NICORETTE) 4 MG gum, Take 4 mg by mouth as needed for smoking cessation. .  ranitidine (ZANTAC) 150 MG tablet, Take 1 tablet (150 mg total) by mouth 2 (two) times daily. .  Vitamin D, Ergocalciferol, (DRISDOL) 50000 units CAPS capsule, Take 1 capsule (50,000 Units total) by mouth every 7 (seven) days.    Past medical history, social, surgical and family history all reviewed in electronic medical record.  No pertanent information unless stated regarding to the chief complaint.   Review of Systems:  No headache, visual changes, nausea, vomiting, diarrhea, constipation, dizziness, abdominal pain, skin rash, fevers, chills, night sweats, weight loss, swollen lymph nodes,  chest pain, shortness of breath, mood changes  positive muscle aches, body aches, joint swelling.    Objective  Blood pressure 132/90, pulse 94, height 5\' 6"  (1.676 m), weight 144 lb (65.3 kg), SpO2 98 %.    General: No apparent distress alert and oriented x3 mood and affect normal, dressed appropriately.  HEENT: Pupils equal, extraocular movements intact  Respiratory: Patient's speak in full sentences and does not appear short of breath  Cardiovascular: No lower extremity edema,  non tender, no erythema  Skin: Warm dry intact with no signs of infection or rash on extremities or on axial skeleton.  Abdomen: Soft nontender  Neuro: Cranial nerves II through XII are intact, neurovascularly intact in all extremities with 2+ DTRs and 2+ pulses.  Lymph: No lymphadenopathy of posterior or anterior cervical chain or axillae bilaterally.  Gait normal with good balance and coordination.  MSK:  Non tender with full range of motion and good stability and symmetric strength and tone of shoulders, elbows, wrist, hip, knee bilaterally.  Ankle: right swelling noted to the talar dome Range of motion is decreased by 5 degrees and plantar and dorsiflexion. Strength is 4/5 in all directions. Stable lateral and medial ligaments; squeeze test and kleiger test unremarkable; Talar dome tender; No pain at base of 5th MT; No tenderness over cuboid; No tenderness over N spot or navicular prominence No tenderness on posterior aspects of lateral and medial malleolus No sign of peroneal tendon subluxations or tenderness to palpation Negative tarsal tunnel tinel's Able to walk 4 steps. Contralateral ankle unremarkable  MSK US performed of: Right ankle This study was ordered, performed, and interpreted by Charlann Boxer D.O.  Foot/Ankle:   All structures visualized.   Talar dome narrowing noted Ankle mortise moderate  Peroneus longus and brevis tendons. swelling trace swelling Posterior tibialis, flexor hallucis longus, and flexor digitorum longus tendons unremarkable on long and transverse views without sheath effusions. Achilles tendon visualized along length of tendon and unremarkable on long and transverse views without sheath effusion. Anterior Talofibular Ligament chronic changes Deltoid Ligament unremarkable and intact.   IMPRESSION: Arthritic changes with trace effusion    Impression and Recommendations:     This case required medical decision making of moderate complexity. The  above documentation has been reviewed and is accurate and complete Lyndal Pulley, DO       Note: This dictation was prepared with Dragon dictation along with smaller phrase technology. Any transcriptional errors that result from this process are unintentional.

## 2018-06-11 ENCOUNTER — Ambulatory Visit: Payer: Self-pay

## 2018-06-11 ENCOUNTER — Encounter: Payer: Self-pay | Admitting: Family Medicine

## 2018-06-11 ENCOUNTER — Ambulatory Visit: Payer: 59 | Admitting: Family Medicine

## 2018-06-11 ENCOUNTER — Other Ambulatory Visit (INDEPENDENT_AMBULATORY_CARE_PROVIDER_SITE_OTHER): Payer: 59

## 2018-06-11 VITALS — BP 132/90 | HR 94 | Ht 66.0 in | Wt 144.0 lb

## 2018-06-11 DIAGNOSIS — M25572 Pain in left ankle and joints of left foot: Secondary | ICD-10-CM

## 2018-06-11 DIAGNOSIS — M2550 Pain in unspecified joint: Secondary | ICD-10-CM | POA: Diagnosis not present

## 2018-06-11 DIAGNOSIS — M25571 Pain in right ankle and joints of right foot: Secondary | ICD-10-CM

## 2018-06-11 DIAGNOSIS — G8929 Other chronic pain: Secondary | ICD-10-CM | POA: Diagnosis not present

## 2018-06-11 DIAGNOSIS — M958 Other specified acquired deformities of musculoskeletal system: Secondary | ICD-10-CM | POA: Diagnosis not present

## 2018-06-11 DIAGNOSIS — E876 Hypokalemia: Secondary | ICD-10-CM

## 2018-06-11 LAB — COMPREHENSIVE METABOLIC PANEL
ALBUMIN: 4.4 g/dL (ref 3.5–5.2)
ALK PHOS: 106 U/L (ref 39–117)
ALT: 10 U/L (ref 0–35)
AST: 13 U/L (ref 0–37)
BUN: 10 mg/dL (ref 6–23)
CALCIUM: 10.5 mg/dL (ref 8.4–10.5)
CO2: 28 mEq/L (ref 19–32)
CREATININE: 1.01 mg/dL (ref 0.40–1.20)
Chloride: 103 mEq/L (ref 96–112)
GFR: 74.03 mL/min (ref >60.00)
Glucose, Bld: 98 mg/dL (ref 70–99)
Potassium: 3.6 mEq/L (ref 3.5–5.1)
Sodium: 140 mEq/L (ref 135–145)
TOTAL PROTEIN: 7.6 g/dL (ref 6.0–8.3)
Total Bilirubin: 0.2 mg/dL (ref 0.2–1.2)

## 2018-06-11 LAB — CBC WITH DIFFERENTIAL/PLATELET
BASOS ABS: 0.1 10*3/uL (ref 0.0–0.1)
Basophils Relative: 1.3 % (ref 0.0–3.0)
Eosinophils Absolute: 0.5 10*3/uL (ref 0.0–0.7)
Eosinophils Relative: 8.4 % — ABNORMAL HIGH (ref 0.0–5.0)
HCT: 41.4 % (ref 36.0–46.0)
Hemoglobin: 14.2 g/dL (ref 12.0–15.0)
LYMPHS ABS: 2.1 10*3/uL (ref 0.7–4.0)
LYMPHS PCT: 33.8 % (ref 12.0–46.0)
MCHC: 34.3 g/dL (ref 30.0–36.0)
MCV: 90.7 fl (ref 78.0–100.0)
MONOS PCT: 8.5 % (ref 3.0–12.0)
Monocytes Absolute: 0.5 10*3/uL (ref 0.1–1.0)
NEUTROS PCT: 48 % (ref 43.0–77.0)
Neutro Abs: 3 10*3/uL (ref 1.4–7.7)
PLATELETS: 258 10*3/uL (ref 150.0–400.0)
RBC: 4.56 Mil/uL (ref 3.87–5.11)
RDW: 13.7 % (ref 11.5–15.5)
WBC: 6.3 10*3/uL (ref 4.0–10.5)

## 2018-06-11 LAB — IBC PANEL
Iron: 83 ug/dL (ref 42–145)
Saturation Ratios: 23.2 % (ref 20.0–50.0)
Transferrin: 255 mg/dL (ref 212.0–360.0)

## 2018-06-11 LAB — URIC ACID: URIC ACID, SERUM: 5.8 mg/dL (ref 2.4–7.0)

## 2018-06-11 LAB — SEDIMENTATION RATE: Sed Rate: 16 mm/hr (ref 0–30)

## 2018-06-11 MED ORDER — VITAMIN D (ERGOCALCIFEROL) 1.25 MG (50000 UNIT) PO CAPS: 50000.0000 [IU] | ORAL_CAPSULE | ORAL | 0 refills | Status: DC

## 2018-06-11 NOTE — Patient Instructions (Signed)
Good to see you  Ice 20 minutes 2 times daily. Usually after activity and before bed. Wear brace with walking Once weekly vitamin D for 12 weeks Lets get labs today  Would like to refer you to Dr. Clyde Canterbury at [facility] that I think can help [place] significantly.  See me again in 4-5 weeks

## 2018-06-11 NOTE — Assessment & Plan Note (Signed)
Patient does have an OCD lesion.  Only 6-minute millimeters in size 2 years ago.  Patient has had a history of breast cancer, laboratory work-up also ordered.  I believe though that patient would likely need surgical intervention with the instability.  Once weekly vitamin D patient will come back in 4 weeks

## 2018-06-12 ENCOUNTER — Other Ambulatory Visit: Payer: Self-pay

## 2018-06-12 ENCOUNTER — Encounter (HOSPITAL_COMMUNITY): Payer: Self-pay | Admitting: Emergency Medicine

## 2018-06-12 ENCOUNTER — Ambulatory Visit (HOSPITAL_COMMUNITY)
Admission: EM | Admit: 2018-06-12 | Discharge: 2018-06-12 | Disposition: A | Discharge status: Home / Self Care | Payer: 59 | Attending: Family Medicine | Admitting: Family Medicine

## 2018-06-12 DIAGNOSIS — G4700 Insomnia, unspecified: Secondary | ICD-10-CM

## 2018-06-12 DIAGNOSIS — R12 Heartburn: Secondary | ICD-10-CM

## 2018-06-12 DIAGNOSIS — F418 Other specified anxiety disorders: Secondary | ICD-10-CM

## 2018-06-12 MED ORDER — ALPRAZOLAM 0.25 MG PO TABS: 0.2500 mg | ORAL_TABLET | Freq: Two times a day (BID) | ORAL | 0 refills | Status: DC | PRN

## 2018-06-12 MED ORDER — RANITIDINE HCL 150 MG PO TABS: 150.0000 mg | ORAL_TABLET | Freq: Two times a day (BID) | ORAL | 0 refills | Status: DC

## 2018-06-12 NOTE — ED Triage Notes (Signed)
Nausea, heartburn is intermittent for a week.  Reports bp 143/103

## 2018-06-12 NOTE — Medical Student Note (Signed)
[facility] Statistician Note For educational purposes for Medical, PA and NP students only and not part of the legal medical record.   CSN: [id number] Arrival date & time: 06/12/18  1940     History   Chief Complaint Chief Complaint  Patient presents with  . Nausea    HPI [name] is a 52 y.o. female. Pt presents complaining of dyspepsia, nausea, and HTN.  O- Heartburn and nausea started last Thursday/Friday.  L- Across the entire width of the chest inferior to the clavicle D- Intermittent. Worse following eating.  C- Burning A- Food.  R- None  T- Tums.  I- Concerned about the elevated BP She noticed her BP was elevated when she checked her BP in [facility]. She came [facility] following a high reading there. Denies chest pain, back, jaw, or arm pain & changes in vision.    Past Medical History:  Diagnosis Date  . Anxiety   . Breast cancer [facility]) 1999   2004  . Headache   . Hyperlipidemia   . Numbness     Patient Active Problem List   Diagnosis Date Noted  . 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 Lightheaded 08/14/2016  . 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 Generalized anxiety disorder 11/02/2015  . Facial droop 12/01/2013  . Sensory disturbance 12/01/2013  . Trichomonas contact, treated 08/05/2013  . UTI 11/22/2010  . FLANK PAIN, LEFT 11/22/2010  . TOBACCO USE 10/11/2010  . BREAST CANCER, HX OF 10/11/2010    Past Surgical History:  Procedure Laterality Date  . ABDOMINAL HYSTERECTOMY  2006   TAH,BSO secondary bleeding and breast cancer--  . BREAST SURGERY    . CORNEAL TRANSPLANT Bilateral    6x, started in 1999, last was 2006.  Marland Kitchen EYE SURGERY    . MASTECTOMY     bilateral---1999/2004    OB History   None      Home Medications    Prior to Admission medications   Medication Sig Start Date End Date Taking? Authorizing Provider  aspirin 81 MG tablet Take 1 tablet (81 mg total) by mouth daily. 12/02/13  Yes Vann,  Jessica U, DO  atorvastatin (LIPITOR) 10 MG tablet TAKE 1 TABLET BY MOUTH DAILY AT 6:00PM 03/25/18  Yes [name], DO  Vitamin D, Ergocalciferol, (DRISDOL) 50000 units CAPS capsule Take 1 capsule (50,000 Units total) by mouth every 7 (seven) days. 06/11/18  Yes Lyndal Pulley, DO  acyclovir (ZOVIRAX) 400 MG tablet Take 1 tablet (400 mg total) by mouth 2 (two) times daily. 01/31/18   [name], DO  ALPRAZolam (XANAX) 0.25 MG tablet Take 1 tablet (0.25 mg total) by mouth 2 (two) times daily as needed. for anxiety 06/12/18   Vanessa Kick, MD  ranitidine (ZANTAC) 150 MG tablet Take 1 tablet (150 mg total) by mouth 2 (two) times daily. 06/12/18   Vanessa Kick, MD    Family History Family History  Problem Relation Age of Onset  . Stroke Sister 63       after starting OCP  . Hyperlipidemia Sister   . Aortic aneurysm Mother   . Aortic aneurysm Unknown   . Hypertension Unknown   . Seizures Neg Hx   . Migraines Neg Hx     Social History Social History   Tobacco Use  . Smoking status: Current Some Day Smoker    Packs/day: 0.25    Years: 27.00    Pack years: 6.75  Types: Cigarettes  . Smokeless tobacco: Never Used  . Tobacco comment: Trying to quit  Substance Use Topics  . Alcohol use: Yes    Alcohol/week: 0.0 standard drinks    Comment: Rare  . Drug use: No     Allergies   Penicillins   Review of Systems Review of Systems  Constitutional: Negative for activity change and appetite change.  Eyes: Negative for photophobia and visual disturbance.  Respiratory: Negative for chest tightness and shortness of breath.   Cardiovascular: Negative for chest pain and palpitations.  Gastrointestinal: Positive for nausea. Negative for vomiting.       Epigastric pain.   Neurological: Positive for light-headedness. Negative for dizziness, syncope, numbness and headaches.     Physical Exam Updated Vital Signs BP (!) 135/97 (BP Location: Right Arm)   Pulse (!) 115    Temp 98.2 F (36.8 C) (Oral)   Resp 20   SpO2 100%   Physical Exam  Constitutional: She is oriented to person, place, and time. She appears well-developed and well-nourished.  Eyes: Pupils are equal, round, and reactive to light. EOM are normal.  Cardiovascular: Regular rhythm, normal heart sounds, intact distal pulses and normal pulses. Tachycardia present. PMI is not displaced.  Pulmonary/Chest: Effort normal and breath sounds normal. No respiratory distress. She exhibits no tenderness.  Abdominal: Soft. Normal appearance and bowel sounds are normal. She exhibits no distension. There is tenderness in the epigastric area.  Neurological: She is alert and oriented to person, place, and time. No sensory deficit.  Nursing note and vitals reviewed.    ED Treatments / Results  Labs (all labs ordered are listed, but only abnormal results are displayed) Labs Reviewed - No data to display  EKG None Radiology No results found.  Procedures Procedures (including critical care time)  Medications Ordered in ED Medications - No data to display   Initial Impression / Assessment and Plan / ED Course  I have reviewed the triage vital signs and the nursing notes.  Pertinent labs & imaging results that were available during my care of the patient were reviewed by me and considered in my medical decision making (see chart for details).     Patient experiencing situational anxiety to finding out her daughter had a "lump in her breast". Also, experiencing heartburn.   Xanax PRN prescribed to manage anxiety.  Zantac prescribed PO BID to assist with heartburn.  Encouraged to FU with PCP for elevated BP today on exam and anxiety if she begins to need the short-acting therapy consistently as she may need long-term management.   Final Clinical Impressions(s) / ED Diagnoses   Final diagnoses:  Situational anxiety  Heartburn    New Prescriptions New Prescriptions   RANITIDINE (ZANTAC) 150  MG TABLET    Take 1 tablet (150 mg total) by mouth 2 (two) times daily.

## 2018-06-13 ENCOUNTER — Telehealth: Payer: Self-pay | Admitting: Interventional Cardiology

## 2018-06-13 ENCOUNTER — Ambulatory Visit: Payer: Self-pay | Admitting: *Deleted

## 2018-06-13 ENCOUNTER — Encounter: Payer: Self-pay | Admitting: Physician Assistant

## 2018-06-13 LAB — PTH, INTACT AND CALCIUM
Calcium: 8.8 mg/dL (ref 8.6–10.4)
PTH: 10 pg/mL — ABNORMAL LOW (ref 14–64)

## 2018-06-13 LAB — CALCIUM, IONIZED: Calcium, Ion: 5.84 mg/dL — ABNORMAL HIGH (ref 4.8–5.6)

## 2018-06-13 LAB — RHEUMATOID FACTOR: Rhuematoid fact SerPl-aCnc: <14 IU/mL (ref <14)

## 2018-06-13 LAB — RPR: RPR: REACTIVE — AB

## 2018-06-13 LAB — LACTATE DEHYDROGENASE: LDH: 126 U/L (ref 120–250)

## 2018-06-13 LAB — VITAMIN D 1,25 DIHYDROXY
Vitamin D 1, 25 (OH)2 Total: 51 pg/mL (ref 18–72)
Vitamin D3 1, 25 (OH)2: 51 pg/mL

## 2018-06-13 LAB — ANA: ANA: NEGATIVE

## 2018-06-13 LAB — FLUORESCENT TREPONEMAL AB(FTA)-IGG-BLD: FLUORESCENT TREPONEMAL ABS: REACTIVE — AB

## 2018-06-13 LAB — CYCLIC CITRUL PEPTIDE ANTIBODY, IGG: Cyclic Citrullin Peptide Ab: <16 UNITS

## 2018-06-13 LAB — RPR TITER

## 2018-06-13 NOTE — Telephone Encounter (Signed)
Returned call to patient. Patient states that she has been having intermittent episodes of heartburn and lightheadedness for 1 week. She states that her heartburn is across her chest and she becomes lightheaded. She states that these episodes are not associated with eating a meal or with any type of activity. Patient states that she was seen in Urgent Care was told she was not having a heart attack or stroke. She states that they did not do an EKG. She states that they gave her zantac and xanax. She denies having any significant chest pain, SOB, irregular beats, syncope, significant changes in her vision, speech, or strength. She states that her BP has been elevated at 140/103, 135/97, and 130/90 with HRs 80-low 100s. Patient does not take any meds for BP. Patient is a very anxious person. Patient requesting an appointment. Appointment made for tomorrow with Richardson Dopp, PA at 8:45 AM.

## 2018-06-13 NOTE — Progress Notes (Signed)
Cardiology Office Note:    Date:  06/14/2018   ID:  [name], DOB Dec 18, 1965, MRN [id number]  PCP:  [name], Mary Apa, DO  Cardiologist:  Mary Grooms, MD   Referring MD: [name], [name], *   Chief Complaint  Patient presents with  . Chest Pain    History of Present Illness:    [name] is a 52 y.o. female with breast CA s/p bilateral mastectomies, hyperlipidemia, anxiety, tobacco abuse, palpitations.  Last seen in clinic in 10/2016.    Ms. Baby returns for evaluation of chest discomfort.  She actually describes her chest discomfort as heartburn.  This is associated with abdominal pain, nausea and lightheadedness.  These occur in what she calls episodes.  They occur at rest.  She denies exertional symptoms.  She denies shortness of breath, orthopnea, PND or edema.  She denies palpitations, near-syncope or syncope.  She has recorded some high blood pressures at home at times when this is occurring.  She denies any relation to meals, vomiting, hematochezia, hematemesis, weight loss.  Prior CV studies:   The following studies were reviewed today:  GXT 08/24/16  Blood pressure demonstrated a normal response to exercise.  There was no ST segment deviation noted during stress.  Clinically and electrically negative for ischemia Excellent exercise tolerance.  Carotid US 12/02/13 - The vertebral arteries appear patent with antegrade flow. - Findings consistent with 1-39 percent stenosis involving the right internal carotid artery and the left internal carotid artery.  Echo 12/02/13 EF 55-60, no RWMA, normal diastolic function, atrial septal aneurysm   Past Medical History:  Diagnosis Date  . Anxiety   . Breast cancer [facility]) 1999   2004  . Headache   . Hyperlipidemia   . Numbness    Surgical Hx: The patient  has a past surgical history that includes Abdominal hysterectomy (2006); Mastectomy; Corneal transplant (Bilateral); Eye surgery; and Breast  surgery.   Current Medications: Current Meds  Medication Sig  . acyclovir (ZOVIRAX) 400 MG tablet Take 400 mg by mouth daily. Half tab daily  . ALPRAZolam (XANAX) 0.25 MG tablet Take 1 tablet (0.25 mg total) by mouth 2 (two) times daily as needed. for anxiety  . aspirin 81 MG tablet Take 1 tablet (81 mg total) by mouth daily.  Marland Kitchen atorvastatin (LIPITOR) 10 MG tablet TAKE 1 TABLET BY MOUTH DAILY AT 6:00PM  . ranitidine (ZANTAC) 150 MG tablet Take 1 tablet (150 mg total) by mouth 2 (two) times daily.  . Vitamin D, Ergocalciferol, (DRISDOL) 50000 units CAPS capsule Take 1 capsule (50,000 Units total) by mouth every 7 (seven) days.     Allergies:   Penicillins   Social History   Tobacco Use  . Smoking status: Current Some Day Smoker    Packs/day: 0.25    Years: 27.00    Pack years: 6.75    Types: Cigarettes  . Smokeless tobacco: Never Used  . Tobacco comment: Trying to quit  Substance Use Topics  . Alcohol use: Yes    Alcohol/week: 0.0 standard drinks    Comment: Rare  . Drug use: No     Family Hx: The patient's family history includes Aortic aneurysm in her mother and unknown relative; Hyperlipidemia in her sister; Hypertension in her unknown relative; Stroke (age of onset: 45) in her sister. There is no history of Seizures or Migraines.  ROS:   Please see the history of present illness.    Review of Systems  Gastrointestinal: Positive  for nausea.  Neurological: Positive for dizziness.  Psychiatric/Behavioral: The patient is nervous/anxious.    All other systems reviewed and are negative.   EKGs/Labs/Other Test Reviewed:    EKG:  EKG is  ordered today.  The ekg ordered today demonstrates normal sinus rhythm, heart rate 97, normal axis, nonspecific ST-T wave changes, QTC 462  Recent Labs: 06/11/2018: ALT 10; BUN 10; Creatinine, Ser 1.01; Hemoglobin 14.2; Platelets 258.0; Potassium 3.6; Sodium 140   Recent Lipid Panel Lab Results  Component Value Date/Time   CHOL 139  01/24/2016 08:53 AM   TRIG 115.0 01/24/2016 08:53 AM   HDL 30.70 (L) 01/24/2016 08:53 AM   CHOLHDL 5 01/24/2016 08:53 AM   LDLCALC 85 01/24/2016 08:53 AM   LDLDIRECT 141.3 07/03/2012 10:26 AM    Physical Exam:    VS:  BP 104/82   Pulse (!) 103   Ht 5\' 6"  (1.676 m)   Wt 141 lb 6.4 oz (64.1 kg)   SpO2 98%   BMI 22.82 kg/m     Wt Readings from Last 3 Encounters:  06/14/18 141 lb 6.4 oz (64.1 kg)  06/11/18 144 lb (65.3 kg)  04/27/18 142 lb 3.2 oz (64.5 kg)     Physical Exam  Constitutional: She is oriented to person, place, and time. She appears well-developed and well-nourished. No distress.  HENT:  Head: Normocephalic and atraumatic.  Eyes: No scleral icterus.  Neck: No JVD present. No thyromegaly present.  Cardiovascular: Normal rate and regular rhythm.  No murmur heard. Pulmonary/Chest: Effort normal. She has no rales.  Abdominal: Soft. She exhibits no distension. There is no tenderness.  Musculoskeletal: She exhibits no edema.  Lymphadenopathy:    She has no cervical adenopathy.  Neurological: She is alert and oriented to person, place, and time.  Skin: Skin is warm and dry.  Psychiatric: She has a normal mood and affect.    ASSESSMENT & PLAN:    Chest pain, unspecified type He describes symptoms that generally start with abdominal pain followed by nausea, lightheadedness and heartburn.  She notes that the heartburn does radiate across her chest and up into her jaw.  She denies exertional symptoms.  She was given ranitidine when she went to urgent care recently.  She took 2 doses with improved symptoms of indigestion.  She really does not describe any palpitations.  Risk factors for coronary disease really only include smoking history.  ECG does not demonstrate any significant ST-T wave abnormalities.  I have recommended stress testing to screen for ischemic heart disease.  Otherwise, I think she should be seen by gastroenterology and continue on antacid  therapy.  -Continue ranitidine 150 mg twice daily for at least 2 weeks  -Arrange exercise tolerance test  -Refer to gastroenterology  -Follow-up as needed with cardiology depending on test results   Generalized abdominal pain As noted, I recommended she continue on ranitidine twice daily for at least 2 weeks.  Refer to gastroenterology as noted.  Tobacco abuse She is trying to quit smoking.  Dispo:  Return as needed depending on test results.   Medication Adjustments/Labs and Tests Ordered: Current medicines are reviewed at length with the patient today.  Concerns regarding medicines are outlined above.  Tests Ordered: Orders Placed This Encounter  Procedures  . Ambulatory referral to Gastroenterology  . Exercise Tolerance Test  . EKG 12-Lead   Medication Changes: No orders of the defined types were placed in this encounter.   Signed, Richardson Dopp, PA-C  06/14/2018 1:19 PM  [facility] [address], [place], [place]  [address] Phone: [phone]; Fax: [phone]

## 2018-06-13 NOTE — Telephone Encounter (Signed)
Pt called with complaints of high blood pressure on 06/12/18; she says that for the past 5 days she has been feeling bad (heart burn, and light headed); BP 143/103 at [place]; she went to urgent care and was told that she was not having a heart attack or stroke; she says that she was given zantac at urgent care and her heart burn has gone away;  this morning she says that she is feeling funny again; the pt says that she has panic attacks and she a xanax at 830 but the funny feeling is back again; the pt says that her vision is not the same even though she has had left corneal transplant; recommendations made per nurse triage protocol to include seeing a physician within 3 days; pt offered and accepted appointment with Dr Etter Sjogren, [place], 06/14/18 at 1330; she verbalizes understanding; the pt also says that she will have her blood pressure rechecked at work, and will call back with the new value; will also route to office for notification of this upcoming appointment.  Reason for Disposition . Systolic BP  >= 562 OR Diastolic >= 563  Answer Assessment - Initial Assessment Questions 1. BLOOD PRESSURE: "What is the blood pressure?" "Did you take at least two measurements 5 minutes apart?"     143/103 at [facility] and 135/97 at urgent care 2. ONSET: "When did you take your blood pressure?"     06/12/18 3. HOW: "How did you obtain the blood pressure?" (e.g., visiting nurse, automatic home BP monitor)     [facility] cough on left arm (pt has lymphedma; second value taken on right arr at urgent care 4. HISTORY: "Do you have a history of high blood pressure?"     no 5. MEDICATIONS: "Are you taking any medications for blood pressure?" "Have you missed any doses recently?"     N/a 6. OTHER SYMPTOMS: "Do you have any symptoms?" (e.g., headache, chest pain, blurred vision, difficulty breathing, weakness)     Heart burn, upset stomach, light headed 7. PREGNANCY: "Is there any chance you are pregnant?"  "When was your last menstrual period?"     No hysterectomy  Protocols used: HIGH BLOOD PRESSURE-A-AH

## 2018-06-13 NOTE — Telephone Encounter (Signed)
Pt c/o BP issue: STAT if pt c/o blurred vision, one-sided weakness or slurred speech  1. What are your last 5 BP readings? Last night at home 140/103- went to [facility] it was 135/97 and at work a few minutes ago 130/90 13  2. Are you having any other symptoms (ex. Dizziness, headache, blurred vision, passed out)? Dizziness, Heart Burn and Upset stomach  3. What is your BP issue? Blood Pressure running high with these symptoms-pt wants to be seen

## 2018-06-14 ENCOUNTER — Encounter: Payer: Self-pay | Admitting: Physician Assistant

## 2018-06-14 ENCOUNTER — Ambulatory Visit: Payer: 59 | Admitting: Physician Assistant

## 2018-06-14 ENCOUNTER — Encounter: Payer: Self-pay | Admitting: Gastroenterology

## 2018-06-14 ENCOUNTER — Ambulatory Visit: Payer: 59 | Admitting: Family Medicine

## 2018-06-14 VITALS — BP 104/82 | HR 103 | Ht 66.0 in | Wt 141.4 lb

## 2018-06-14 DIAGNOSIS — R079 Chest pain, unspecified: Secondary | ICD-10-CM | POA: Diagnosis not present

## 2018-06-14 DIAGNOSIS — Z720 Tobacco use: Secondary | ICD-10-CM | POA: Diagnosis not present

## 2018-06-14 DIAGNOSIS — Z0289 Encounter for other administrative examinations: Secondary | ICD-10-CM

## 2018-06-14 DIAGNOSIS — K2100 Gastro-esophageal reflux disease with esophagitis, without bleeding: Secondary | ICD-10-CM

## 2018-06-14 DIAGNOSIS — R1084 Generalized abdominal pain: Secondary | ICD-10-CM

## 2018-06-14 NOTE — Patient Instructions (Signed)
Medication Instructions:  1. TAKE THE RANTIDINE (ZANTAC) TWICE DAILY FOR AT LEAST 2 WEEKS   Labwork: NONE ORDERED TODAY  Testing/Procedures: Your physician has requested that you have an exercise tolerance test. For further information please visit [email or web address]. Please also follow instruction sheet, as given.    Follow-Up: FOLLOW UP AS NEEDED PENDING TEST RESULTS   Any Other Special Instructions Will Be Listed Below (If Applicable).  YOU ARE BEING REFERRED TO [facility]    If you need a refill on your cardiac medications before your next appointment, please call your pharmacy.

## 2018-06-18 NOTE — ED Provider Notes (Signed)
[place]   [id number] 06/12/18 Arrival Time: 1940  ASSESSMENT & PLAN:  1. Situational anxiety   2. Insomnia, unspecified type   3. Heartburn     Meds ordered this encounter  Medications  . ranitidine (ZANTAC) 150 MG tablet    Sig: Take 1 tablet (150 mg total) by mouth 2 (two) times daily.    Dispense:  60 tablet    Refill:  0  . ALPRAZolam (XANAX) 0.25 MG tablet    Sig: Take 1 tablet (0.25 mg total) by mouth 2 (two) times daily as needed. for anxiety    Dispense:  15 tablet    Refill:  0   Medication sedation precautions given. To f/u with PCP for further treatment and/or refills. Sleep hygiene discussed.  Reviewed expectations re: course of current medical issues. Questions answered. Outlined signs and symptoms indicating need for more acute intervention. Patient verbalized understanding. After Visit Summary given.   SUBJECTIVE:  [name] is a 52 y.o. female who complains of feeling anxious with some panic attack symptoms. History of similar in the past; has used Xanax. Also recent insomnia. "Just life stress." Panic attacks are infrequent and last 20 minutes or so. No recent illness. No drug or heavy alcohol use. She denies current suicidal and homicidal ideation. Could not get in to see her PCP.  Also complains of intermittent heartburn over the past week or two. No OTC treatment. Belching more. No n/v. No abdominal pains. Normal appetite and PO intake.  ROS: As per HPI.   OBJECTIVE:  Vitals:   06/12/18 2017  BP: (!) 135/97  Pulse: (!) 115  Resp: 20  Temp: 98.2 F (36.8 C)  TempSrc: Oral  SpO2: 100%    General appearance: alert; no distress Eyes: PERRLA; EOMI; conjunctiva normal HENT: normocephalic; atraumatic Neck: supple Lungs: clear to auscultation bilaterally Heart: regular; recheck pulse 102 Abdomen: soft, non-tender  Extremities: no cyanosis or edema; symmetrical with no gross deformities Skin: warm and dry Neurologic: normal  gait; normal symmetric reflexes Psychological: alert and cooperative; normal mood and affect   Allergies  Allergen Reactions  . Penicillins Other (See Comments)    Childhood reaction    Past Medical History:  Diagnosis Date  . Anxiety   . Breast cancer [facility]) 1999   2004  . Headache   . Hyperlipidemia   . Numbness    Social History   Socioeconomic History  . Marital status: Single    Spouse name: Not on file  . Number of children: 1  . Years of education: 72  . Highest education level: Not on file  Occupational History  . Occupation: works for [employer]; office specialist  Social Needs  . Financial resource strain: Not on file  . Food insecurity:    Worry: Not on file    Inability: Not on file  . Transportation needs:    Medical: Not on file    Non-medical: Not on file  Tobacco Use  . Smoking status: Current Some Day Smoker    Packs/day: 0.25    Years: 27.00    Pack years: 6.75    Types: Cigarettes  . Smokeless tobacco: Never Used  . Tobacco comment: Trying to quit  Substance and Sexual Activity  . Alcohol use: Yes    Alcohol/week: 0.0 standard drinks    Comment: Rare  . Drug use: No  . Sexual activity: Not Currently  Lifestyle  . Physical activity:    Days per week: Not on  file    Minutes per session: Not on file  . Stress: Not on file  Relationships  . Social connections:    Talks on phone: Not on file    Gets together: Not on file    Attends religious service: Not on file    Active member of club or organization: Not on file    Attends meetings of clubs or organizations: Not on file    Relationship status: Not on file  . Intimate partner violence:    Fear of current or ex partner: Not on file    Emotionally abused: Not on file    Physically abused: Not on file    Forced sexual activity: Not on file  Other Topics Concern  . Not on file  Social History Narrative   Marital status: single      Children: one daughter      Lives: with  sister      Employment: works for [employer]; office specialist      Education: Multimedia programmer      Tobacco: trying to quit      Alcohol: none; rare      Drugs: none      Exercise: walking daily 2 miles      Caffeine use: Tea/pepsi- 1 pepsi/morning      Tea- 3 cups per day   Right-handed   Family History  Problem Relation Age of Onset  . Stroke Sister 60       after starting OCP  . Hyperlipidemia Sister   . Aortic aneurysm Mother   . Aortic aneurysm Unknown   . Hypertension Unknown   . Seizures Neg Hx   . Migraines Neg Hx    Past Surgical History:  Procedure Laterality Date  . ABDOMINAL HYSTERECTOMY  2006   TAH,BSO secondary bleeding and breast cancer--  . BREAST SURGERY    . CORNEAL TRANSPLANT Bilateral    6x, started in 1999, last was 2006.  Marland Kitchen EYE SURGERY    . MASTECTOMY     bilateral---1999/2004      Vanessa Kick, MD 06/19/18 [phone]

## 2018-06-19 ENCOUNTER — Other Ambulatory Visit: Payer: Self-pay

## 2018-06-19 NOTE — Progress Notes (Signed)
Talked to patient about labs. Will be referring her to endocrinology.

## 2018-06-20 ENCOUNTER — Telehealth: Payer: Self-pay

## 2018-06-20 NOTE — Telephone Encounter (Signed)
Patient returned call asking if it was ok to continue to take vitamin D with her calcium elevated. I told her that it would be ok for her to continue taking vitamin D.

## 2018-06-20 NOTE — Telephone Encounter (Signed)
Called patient and left a message 

## 2018-06-20 NOTE — Telephone Encounter (Signed)
Copied from [place] [phone]. Topic: General - Other >> Jun 19, 2018 11:41 AM Yvette Rack wrote: Reason for CRM: Pt returned call to [facility]. Pt states she was speaking with Lonn Georgia about lab results and had some questions. Pt requests call back. Cb# [phone] >> Jun 19, 2018 11:47 AM Para Skeans A wrote: This is for Dr. Tamala Julian and maybe she was speaking with Om [place]?

## 2018-06-20 NOTE — Telephone Encounter (Signed)
Copied from [place] [phone]. Topic: General - Other >> Jun 19, 2018 11:41 AM Yvette Rack wrote: Reason for CRM: Pt returned call to [facility]. Pt states she was speaking with Lonn Georgia about lab results and had some questions. Pt requests call back. Cb# [phone] >> Jun 19, 2018 11:47 AM Para Skeans A wrote: This is for Dr. Tamala Julian and maybe she was speaking with Shayanne Gomm?

## 2018-06-21 ENCOUNTER — Ambulatory Visit (INDEPENDENT_AMBULATORY_CARE_PROVIDER_SITE_OTHER): Payer: 59

## 2018-06-21 ENCOUNTER — Encounter: Payer: Self-pay | Admitting: Physician Assistant

## 2018-06-21 DIAGNOSIS — R079 Chest pain, unspecified: Secondary | ICD-10-CM

## 2018-06-21 LAB — EXERCISE TOLERANCE TEST
CSEPHR: 92 %
Estimated workload: 7 METS
Exercise duration (min): 5 min
Exercise duration (sec): 48 s
MPHR: 169 {beats}/min
Peak HR: 157 {beats}/min
RPE: 15
Rest HR: 87 {beats}/min

## 2018-07-11 ENCOUNTER — Telehealth: Payer: Self-pay | Admitting: Family Medicine

## 2018-07-11 NOTE — Telephone Encounter (Signed)
Copied from [place] [phone]. Topic: General - Other >> Jul 11, 2018  9:43 AM Bea Graff, NT wrote: Reason for CRM: Pt states she has an appt on 10/7 with Dr. Tamala Julian but her appt with Dr. Gearldine Shown is not until 10/18 and she wants to see if Dr. Tamala Julian wants her to r/s with him for after she sees the specialist? Please advise.

## 2018-07-11 NOTE — Telephone Encounter (Signed)
Called pt & rescheduled her appt with Dr. Tamala Julian until 07/31/18.

## 2018-07-15 ENCOUNTER — Ambulatory Visit: Payer: 59 | Admitting: Family Medicine

## 2018-07-16 ENCOUNTER — Other Ambulatory Visit: Payer: Self-pay | Admitting: Family Medicine

## 2018-07-23 ENCOUNTER — Encounter: Payer: Self-pay | Admitting: Family Medicine

## 2018-07-26 ENCOUNTER — Other Ambulatory Visit: Payer: Self-pay | Admitting: Orthopaedic Surgery

## 2018-07-26 DIAGNOSIS — M25571 Pain in right ankle and joints of right foot: Principal | ICD-10-CM

## 2018-07-26 DIAGNOSIS — G8929 Other chronic pain: Secondary | ICD-10-CM | POA: Insufficient documentation

## 2018-07-26 DIAGNOSIS — M21861 Other specified acquired deformities of right lower leg: Secondary | ICD-10-CM | POA: Diagnosis not present

## 2018-07-26 DIAGNOSIS — M958 Other specified acquired deformities of musculoskeletal system: Secondary | ICD-10-CM | POA: Diagnosis not present

## 2018-07-29 ENCOUNTER — Other Ambulatory Visit: Payer: 59

## 2018-07-31 ENCOUNTER — Ambulatory Visit: Payer: 59 | Admitting: Family Medicine

## 2018-08-02 ENCOUNTER — Ambulatory Visit: Payer: 59 | Admitting: Gastroenterology

## 2018-08-02 DIAGNOSIS — Z23 Encounter for immunization: Secondary | ICD-10-CM | POA: Diagnosis not present

## 2018-08-21 ENCOUNTER — Ambulatory Visit: Payer: 59 | Admitting: Internal Medicine

## 2018-08-21 DIAGNOSIS — H01021 Squamous blepharitis right upper eyelid: Secondary | ICD-10-CM | POA: Diagnosis not present

## 2018-08-21 DIAGNOSIS — H01024 Squamous blepharitis left upper eyelid: Secondary | ICD-10-CM | POA: Diagnosis not present

## 2018-08-21 DIAGNOSIS — H01022 Squamous blepharitis right lower eyelid: Secondary | ICD-10-CM | POA: Diagnosis not present

## 2018-08-24 ENCOUNTER — Ambulatory Visit
Admission: RE | Admit: 2018-08-24 | Discharge: 2018-08-24 | Disposition: A | Discharge status: Home / Self Care | Payer: 59 | Source: Ambulatory Visit | Attending: Orthopaedic Surgery | Admitting: Orthopaedic Surgery

## 2018-08-24 DIAGNOSIS — G8929 Other chronic pain: Secondary | ICD-10-CM

## 2018-08-24 DIAGNOSIS — M25571 Pain in right ankle and joints of right foot: Secondary | ICD-10-CM | POA: Diagnosis not present

## 2018-08-28 ENCOUNTER — Ambulatory Visit: Payer: 59 | Admitting: Internal Medicine

## 2018-08-28 DIAGNOSIS — Z0289 Encounter for other administrative examinations: Secondary | ICD-10-CM

## 2018-08-28 NOTE — Progress Notes (Deleted)
Name: [name]  MRN/ DOB: [id number], 12-04-1965    Age/ Sex: 52 y.o., female    PCP: Carollee Herter, Alferd Apa, DO   Reason for Endocrinology Evaluation: Hypercalcemia      Date of Initial Endocrinology Evaluation: 08/28/2018     HPI: [name] is a 52 y.o. female with a past medical history of dyslipidemia and anxiety . The patient presented for initial endocrinology clinic visit on 08/28/2018 for consultative assistance with her Hypercalcemia    ***     Pituitary : Pt was noted on a head MRI to have a slight enlargement of the pituitary with no adenoma noted.   HISTORY:  Past Medical History:  Past Medical History:  Diagnosis Date  . Anxiety   . Breast cancer [facility]) 1999   2004  . Headache   . History of exercise stress test    GXT 9/19:  Exercised 5'48"; METs: 7; Normal BP response; No ischemic EKG changes  . Hyperlipidemia   . Numbness     Past Surgical History:  Past Surgical History:  Procedure Laterality Date  . ABDOMINAL HYSTERECTOMY  2006   TAH,BSO secondary bleeding and breast cancer--  . BREAST SURGERY    . CORNEAL TRANSPLANT Bilateral    6x, started in 1999, last was 2006.  Marland Kitchen EYE SURGERY    . MASTECTOMY     bilateral---1999/2004      Social History:  reports that she has been smoking cigarettes. She has a 6.75 pack-year smoking history. She has never used smokeless tobacco. She reports that she drinks alcohol. She reports that she does not use drugs.  Family History: family history includes Aortic aneurysm in her mother and unknown relative; Hyperlipidemia in her sister; Hypertension in her unknown relative; Stroke (age of onset: 37) in her sister.   HOME MEDICATIONS: Current Outpatient Medications on File Prior to Visit  Medication Sig Dispense Refill  . acyclovir (ZOVIRAX) 400 MG tablet Take 400 mg by mouth daily. Half tab daily    . ALPRAZolam (XANAX) 0.25 MG tablet Take 1 tablet (0.25 mg total) by mouth 2 (two) times daily as  needed. for anxiety 15 tablet 0  . aspirin 81 MG tablet Take 1 tablet (81 mg total) by mouth daily. 30 tablet   . atorvastatin (LIPITOR) 10 MG tablet Take 1 tablet (10 mg total) by mouth daily at 6 PM. 90 tablet 0  . ranitidine (ZANTAC) 150 MG tablet Take 1 tablet (150 mg total) by mouth 2 (two) times daily. 60 tablet 0  . Vitamin D, Ergocalciferol, (DRISDOL) 50000 units CAPS capsule Take 1 capsule (50,000 Units total) by mouth every 7 (seven) days. 12 capsule 0   No current facility-administered medications on file prior to visit.       REVIEW OF SYSTEMS: A comprehensive ROS was conducted with the patient and is negative except as per HPI and below:  ROS     OBJECTIVE:  VS: There were no vitals taken for this visit.   Wt Readings from Last 3 Encounters:  06/14/18 141 lb 6.4 oz (64.1 kg)  06/11/18 144 lb (65.3 kg)  04/27/18 142 lb 3.2 oz (64.5 kg)     EXAM: General: Pt appears well and is in NAD  Hydration: Well-hydrated with moist mucous membranes and good skin turgor  Eyes: External eye exam normal without stare, lid lag or exophthalmos.  EOM intact.  PERRL.  Ears, Nose, Throat: Hearing: Grossly intact bilaterally Dental: Good dentition  Throat: Clear without mass, erythema or exudate  Neck: General: Supple without adenopathy. Thyroid: Thyroid size normal.  No goiter or nodules appreciated. No thyroid bruit.  Lungs: Clear with good BS bilat with no rales, rhonchi, or wheezes  Heart: Auscultation: RRR.  Abdomen: Normoactive bowel sounds, soft, nontender, without masses or organomegaly palpable  Extremities: Gait and station: Normal gait  Digits and nails: No clubbing, cyanosis, petechiae, or nodes Head and neck: Normal alignment and mobility BL UE: Normal ROM and strength. BL LE: No pretibial edema normal ROM and strength.  Skin: Hair: Texture and amount normal with gender appropriate distribution Skin Inspection: No rashes, acanthosis nigricans/skin tags. No  lipohypertrophy Skin Palpation: Skin temperature, texture, and thickness normal to palpation  Neuro: Cranial nerves: II - XII grossly intact  Cerebellar: Normal coordination and movement; no tremor Motor: Normal strength throughout DTRs: 2+ and symmetric in UE without delay in relaxation phase  Mental Status: Judgment, insight: Intact Orientation: Oriented to time, place, and person Memory: Intact for recent and remote events Mood and affect: No depression, anxiety, or agitation     DATA REVIEWED: ***  Lab Results  Component Value Date   CHOL 139 01/24/2016   HDL 30.70 (L) 01/24/2016   LDLCALC 85 01/24/2016   LDLDIRECT 141.3 07/03/2012   TRIG 115.0 01/24/2016   CHOLHDL 5 01/24/2016        ASSESSMENT/PLAN/RECOMMENDATIONS:   1.         Signed electronically by: Mack Guise, MD  [facility] Endocrinology  [facility] [address] [place], [place] [address] Phone: [phone] FAX: [phone]   CC: [address] [place] [address] Phone: [phone] Fax: [phone]   Return to Endocrinology clinic as below: No future appointments.

## 2018-10-07 ENCOUNTER — Encounter: Payer: Self-pay | Admitting: *Deleted

## 2018-10-10 DIAGNOSIS — M958 Other specified acquired deformities of musculoskeletal system: Secondary | ICD-10-CM | POA: Diagnosis not present

## 2018-10-10 DIAGNOSIS — M25571 Pain in right ankle and joints of right foot: Secondary | ICD-10-CM | POA: Diagnosis not present

## 2018-10-10 DIAGNOSIS — M899 Disorder of bone, unspecified: Secondary | ICD-10-CM | POA: Insufficient documentation

## 2018-10-10 DIAGNOSIS — G8929 Other chronic pain: Secondary | ICD-10-CM | POA: Diagnosis not present

## 2018-10-11 ENCOUNTER — Ambulatory Visit: Payer: Self-pay | Admitting: *Deleted

## 2018-10-11 NOTE — Telephone Encounter (Signed)
Pt called with complaints of high blood; she states that her BP at her doctor at [facility] was 140/97 (sitting); she also had a slight headache rated 2 out of 10; later that day 132/81 (sitting), and 118/76 (laying); the pt states that she felt fine; 10/11/2018 at 0300 143/92 (sitting)and she subsequently had a headache rated 3-4 out of 10, she took a 500 mg tylenol at that time ; 0700 (laying) it was 118/80, 0830 124/96 (laying); these were taken on right upper arm with home cuff; the pt also says that she may be dehydrated, and she may be coming down with something because her sister has a cold; recommendations made per nurse triage protocol; pt offered and accepted appointment with Dr Etter Sjogren, [place], 10/15/2018 at 0815; she verbalized understanding; will route to office for notification of this upcoming appointment.   Reason for Disposition . [3] Systolic BP  >= 428 OR Diastolic >= 80 AND [7] not taking BP medications  Answer Assessment - Initial Assessment Questions 1. BLOOD PRESSURE: "What is the blood pressure?" "Did you take at least two measurements 5 minutes apart?"     140/97 132/81 2. ONSET: "When did you take your blood pressure?"     10/10/2018   3. HOW: "How did you obtain the blood pressure?" (e.g., visiting nurse, automatic home BP monitor)     Home cuff on right upper arm  4. HISTORY: "Do you have a history of high blood pressure?"     np 5. MEDICATIONS: "Are you taking any medications for blood pressure?" "Have you missed any doses recently?"     no 6. OTHER SYMPTOMS: "Do you have any symptoms?" (e.g., headache, chest pain, blurred vision, difficulty breathing, weakness)     Slight headache 7. PREGNANCY: "Is there any chance you are pregnant?" "When was your last menstrual period?"     No hysterectomy  Protocols used: HIGH BLOOD PRESSURE-A-AH

## 2018-10-15 ENCOUNTER — Encounter: Payer: Self-pay | Admitting: Family Medicine

## 2018-10-15 ENCOUNTER — Ambulatory Visit: Payer: Self-pay

## 2018-10-15 ENCOUNTER — Ambulatory Visit (INDEPENDENT_AMBULATORY_CARE_PROVIDER_SITE_OTHER): Payer: 59 | Admitting: Family Medicine

## 2018-10-15 VITALS — BP 122/88 | HR 96 | Temp 98.0°F | Resp 16 | Ht 66.0 in | Wt 147.8 lb

## 2018-10-15 DIAGNOSIS — R42 Dizziness and giddiness: Secondary | ICD-10-CM | POA: Diagnosis not present

## 2018-10-15 DIAGNOSIS — R51 Headache: Secondary | ICD-10-CM

## 2018-10-15 DIAGNOSIS — G8929 Other chronic pain: Secondary | ICD-10-CM

## 2018-10-15 DIAGNOSIS — E785 Hyperlipidemia, unspecified: Secondary | ICD-10-CM

## 2018-10-15 LAB — COMPREHENSIVE METABOLIC PANEL
ALBUMIN: 4.5 g/dL (ref 3.5–5.2)
ALK PHOS: 105 U/L (ref 39–117)
ALT: 14 U/L (ref 0–35)
AST: 14 U/L (ref 0–37)
BILIRUBIN TOTAL: 0.4 mg/dL (ref 0.2–1.2)
BUN: 11 mg/dL (ref 6–23)
CALCIUM: 10.2 mg/dL (ref 8.4–10.5)
CHLORIDE: 104 meq/L (ref 96–112)
CO2: 30 mEq/L (ref 19–32)
CREATININE: 0.94 mg/dL (ref 0.40–1.20)
GFR: 80.32 mL/min (ref >60.00)
Glucose, Bld: 84 mg/dL (ref 70–99)
Potassium: 4.6 mEq/L (ref 3.5–5.1)
SODIUM: 139 meq/L (ref 135–145)
TOTAL PROTEIN: 7.4 g/dL (ref 6.0–8.3)

## 2018-10-15 LAB — CBC WITH DIFFERENTIAL/PLATELET
BASOS PCT: 0.6 % (ref 0.0–3.0)
Basophils Absolute: 0 10*3/uL (ref 0.0–0.1)
EOS ABS: 0.3 10*3/uL (ref 0.0–0.7)
EOS PCT: 5 % (ref 0.0–5.0)
HEMATOCRIT: 43.8 % (ref 36.0–46.0)
HEMOGLOBIN: 14.7 g/dL (ref 12.0–15.0)
Lymphocytes Relative: 24.1 % (ref 12.0–46.0)
Lymphs Abs: 1.5 10*3/uL (ref 0.7–4.0)
MCHC: 33.6 g/dL (ref 30.0–36.0)
MCV: 92.5 fl (ref 78.0–100.0)
Monocytes Absolute: 0.5 10*3/uL (ref 0.1–1.0)
Monocytes Relative: 8.4 % (ref 3.0–12.0)
Neutro Abs: 3.9 10*3/uL (ref 1.4–7.7)
Neutrophils Relative %: 61.9 % (ref 43.0–77.0)
Platelets: 274 10*3/uL (ref 150.0–400.0)
RBC: 4.74 Mil/uL (ref 3.87–5.11)
RDW: 13.6 % (ref 11.5–15.5)
WBC: 6.3 10*3/uL (ref 4.0–10.5)

## 2018-10-15 LAB — LIPID PANEL
CHOL/HDL RATIO: 5
Cholesterol: 155 mg/dL (ref 0–200)
HDL: 33.4 mg/dL — AB (ref >39.00)
LDL Cholesterol: 98 mg/dL (ref 0–99)
NONHDL: 121.24
Triglycerides: 114 mg/dL (ref 0.0–149.0)
VLDL: 22.8 mg/dL (ref 0.0–40.0)

## 2018-10-15 LAB — TSH: TSH: 0.53 u[IU]/mL (ref 0.35–4.50)

## 2018-10-15 LAB — VITAMIN B12: VITAMIN B 12: 278 pg/mL (ref 211–911)

## 2018-10-15 NOTE — Telephone Encounter (Signed)
Pt. Reports she is driving home was just seen at Dr. Carollee Herter and started feeling anxious. States "I have to have a MRI and I'm getting myself all worked up. I was going to go back to work, but [employer] going home."  Requests a refill on her Xanax be sent to [facility] at [place].

## 2018-10-15 NOTE — Assessment & Plan Note (Signed)
Check labs  Tolerating statin, encouraged heart healthy diet, avoid trans fats, minimize simple carbs and saturated fats. Increase exercise as tolerated 

## 2018-10-15 NOTE — Telephone Encounter (Signed)
Database ran and is on your desk for review  Last filled per database: 07/26/17 Last written: 06/12/18 by another provider but never filled Last ov: 10/15/18 Next ov: none Contract: none UDS: none

## 2018-10-15 NOTE — Patient Instructions (Signed)
General Headache Without Cause A headache is pain or discomfort that is felt around the head or neck area. There are many causes and types of headaches. In some cases, the cause may not be found. Follow these instructions at home: Watch your condition for any changes. Let your doctor know about them. Take these steps to help with your condition: Managing pain      Take over-the-counter and prescription medicines only as told by your doctor.  Lie down in a dark, quiet room when you have a headache.  If told, put ice on your head and neck area: ? Put ice in a plastic bag. ? Place a towel between your skin and the bag. ? Leave the ice on for 20 minutes, 2-3 times per day.  If told, put heat on the affected area. Use the heat source that your doctor recommends, such as a moist heat pack or a heating pad. ? Place a towel between your skin and the heat source. ? Leave the heat on for 20-30 minutes. ? Remove the heat if your skin turns bright red. This is very important if you are unable to feel pain, heat, or cold. You may have a greater risk of getting burned.  Keep lights dim if bright lights bother you or make your headaches worse. Eating and drinking  Eat meals on a regular schedule.  If you drink alcohol: ? Limit how much you use to:  0-1 drink a day for women.  0-2 drinks a day for men. ? Be aware of how much alcohol is in your drink. In the [place] one drink equals one 12 oz bottle of beer (355 mL), one 5 oz glass of wine (148 mL), or one 1 oz glass of hard liquor (44 mL).  Stop drinking caffeine, or reduce how much caffeine you drink. General instructions   Keep a journal to find out if certain things bring on headaches. For example, write down: ? What you eat and drink. ? How much sleep you get. ? Any change to your diet or medicines.  Get a massage or try other ways to relax.  Limit stress.  Sit up straight. Do not tighten (tense) your muscles.  Do not use any  products that contain nicotine or tobacco. This includes cigarettes, e-cigarettes, and chewing tobacco. If you need help quitting, ask your doctor.  Exercise regularly as told by your doctor.  Get enough sleep. This often means 7-9 hours of sleep each night.  Keep all follow-up visits as told by your doctor. This is important. Contact a doctor if:  Your symptoms are not helped by medicine.  You have a headache that feels different than the other headaches.  You feel sick to your stomach (nauseous) or you throw up (vomit).  You have a fever. Get help right away if:  Your headache gets very bad quickly.  Your headache gets worse after a lot of physical activity.  You keep throwing up.  You have a stiff neck.  You have trouble seeing.  You have trouble speaking.  You have pain in the eye or ear.  Your muscles are weak or you lose muscle control.  You lose your balance or have trouble walking.  You feel like you will pass out (faint) or you pass out.  You are mixed up (confused).  You have a seizure. Summary  A headache is pain or discomfort that is felt around the head or neck area.  There are many causes and   types of headaches. In some cases, the cause may not be found.  Keep a journal to help find out what causes your headaches. Watch your condition for any changes. Let your doctor know about them.  Contact a doctor if you have a headache that is different from usual, or if your headache is not helped by medicine.  Get help right away if your headache gets very bad, you throw up, you have trouble seeing, you lose your balance, or you have a seizure. This information is not intended to replace advice given to you by your health care provider. Make sure you discuss any questions you have with your health care provider. Document Released: 07/04/2008 Document Revised: 04/15/2018 Document Reviewed: 04/15/2018 Elsevier Interactive Patient Education  2019 Elsevier  Inc.  

## 2018-10-15 NOTE — Assessment & Plan Note (Signed)
New--  Pt with worrisome fam hx --- cva Since problem is new and with fam hx---  Check labs and MRI  F/u neuro if needed

## 2018-10-15 NOTE — Assessment & Plan Note (Signed)
?   Serous otitis---  flonase and otc antihistamine Pt has neuro and will f/u with them as well

## 2018-10-15 NOTE — Progress Notes (Signed)
Patient ID: [name], female    DOB: 1966/04/27  Age: 53 y.o. MRN: [id number]    Subjective:  Subjective  HPI [name] presents for lightheadedness , headaches since Thursday and nausea few days ago.  She noticed her bp has been running high.  She is worried because her sister had  Review of Systems  Constitutional: Negative for appetite change, diaphoresis, fatigue and unexpected weight change.  Eyes: Negative for pain, redness and visual disturbance.  Respiratory: Negative for cough, chest tightness, shortness of breath and wheezing.   Cardiovascular: Negative for chest pain, palpitations and leg swelling.  Endocrine: Negative for cold intolerance, heat intolerance, polydipsia, polyphagia and polyuria.  Genitourinary: Negative for difficulty urinating, dysuria and frequency.  Neurological: Positive for dizziness, light-headedness and headaches. Negative for numbness.    History Past Medical History:  Diagnosis Date  . Anxiety   . Breast cancer [facility]) 1999   2004  . Headache   . History of exercise stress test    GXT 9/19:  Exercised 5'48"; METs: 7; Normal BP response; No ischemic EKG changes  . Hyperlipidemia   . Numbness     She has a past surgical history that includes Abdominal hysterectomy (2006); Mastectomy; Corneal transplant (Bilateral); Eye surgery; and Breast surgery.   Her family history includes Aortic aneurysm in her mother and unknown relative; Hyperlipidemia in her sister; Hypertension in her unknown relative; Stroke (age of onset: 83) in her sister.She reports that she has been smoking cigarettes. She has a 6.75 pack-year smoking history. She has never used smokeless tobacco. She reports current alcohol use. She reports that she does not use drugs.  Current Outpatient Medications on File Prior to Visit  Medication Sig Dispense Refill  . acyclovir (ZOVIRAX) 400 MG tablet Take 400 mg by mouth daily.     Marland Kitchen ALPRAZolam (XANAX) 0.25 MG tablet Take 1 tablet  (0.25 mg total) by mouth 2 (two) times daily as needed. for anxiety 15 tablet 0  . aspirin 81 MG tablet Take 1 tablet (81 mg total) by mouth daily. 30 tablet   . atorvastatin (LIPITOR) 10 MG tablet Take 1 tablet (10 mg total) by mouth daily at 6 PM. 90 tablet 0  . ranitidine (ZANTAC) 150 MG tablet Take 1 tablet (150 mg total) by mouth 2 (two) times daily. 60 tablet 0  . Vitamin D, Ergocalciferol, (DRISDOL) 50000 units CAPS capsule Take 1 capsule (50,000 Units total) by mouth every 7 (seven) days. 12 capsule 0   No current facility-administered medications on file prior to visit.      Objective:  Objective  Physical Exam Vitals signs and nursing note reviewed.  Constitutional:      Appearance: She is well-developed.  HENT:     Head: Normocephalic and atraumatic.  Eyes:     Conjunctiva/sclera: Conjunctivae normal.  Neck:     Musculoskeletal: Normal range of motion and neck supple.     Thyroid: No thyromegaly.     Vascular: No carotid bruit or JVD.  Cardiovascular:     Rate and Rhythm: Normal rate and regular rhythm.     Heart sounds: Normal heart sounds. No murmur.  Pulmonary:     Effort: Pulmonary effort is normal. No respiratory distress.     Breath sounds: Normal breath sounds. No wheezing or rales.  Chest:     Chest wall: No tenderness.  Neurological:     General: No focal deficit present.     Mental Status: She is alert and oriented  to person, place, and time.  Psychiatric:        Mood and Affect: Mood normal.        Behavior: Behavior normal.        Thought Content: Thought content normal.        Judgment: Judgment normal.   ekg -- NSR--- no change from previous BP 122/88   Pulse 96   Temp 98 F (36.7 C) (Oral)   Resp 16   Ht 5\' 6"  (1.676 m)   Wt 147 lb 12.8 oz (67 kg)   SpO2 90%   BMI 23.86 kg/m  Wt Readings from Last 3 Encounters:  10/15/18 147 lb 12.8 oz (67 kg)  06/14/18 141 lb 6.4 oz (64.1 kg)  06/11/18 144 lb (65.3 kg)     Lab Results  Component  Value Date   WBC 6.3 06/11/2018   HGB 14.2 06/11/2018   HCT 41.4 06/11/2018   PLT 258.0 06/11/2018   GLUCOSE 98 06/11/2018   CHOL 139 01/24/2016   TRIG 115.0 01/24/2016   HDL 30.70 (L) 01/24/2016   LDLDIRECT 141.3 07/03/2012   LDLCALC 85 01/24/2016   ALT 10 06/11/2018   AST 13 06/11/2018   NA 140 06/11/2018   K 3.6 06/11/2018   CL 103 06/11/2018   CREATININE 1.01 06/11/2018   BUN 10 06/11/2018   CO2 28 06/11/2018   TSH 1.02 08/22/2016   HGBA1C 5.6 12/02/2013   MICROALBUR <0.7 01/24/2016    Mr Ankle Right Wo Contrast  Result Date: 08/24/2018 CLINICAL DATA:  Chronic ankle pain. EXAM: MRI OF THE RIGHT ANKLE WITHOUT CONTRAST TECHNIQUE: Multiplanar, multisequence MR imaging of the ankle was performed. No intravenous contrast was administered. COMPARISON:  Right ankle MRI dated June 22, 2016. FINDINGS: TENDONS Peroneal: Peroneal longus tendon intact. Peroneal brevis intact. Posteromedial: Posterior tibial tendon intact. Trace fluid in the posterior tibialis tendon sheath is likely physiologic. Flexor hallucis longus tendon intact. Flexor digitorum longus tendon intact. Anterior: Tibialis anterior tendon intact. Extensor hallucis longus tendon intact Extensor digitorum longus tendon intact. Achilles:  Intact. Plantar Fascia: Intact. LIGAMENTS Lateral: Anterior talofibular ligament intact. Calcaneofibular ligament intact. Posterior talofibular ligament intact. Anterior and posterior tibiofibular ligaments intact. Medial: Deltoid ligament intact. Spring ligament intact. Lisfranc ligament intact. CARTILAGE Ankle Joint: No joint effusion. Unchanged 6 mm osteochondral lesion of the central tibial plafond with a cortical divot. Underlying 2.5 x 1.1 x 1.4 cm cystic lesion with thin corticated margins and several smaller cystic lesions nearby are similar to prior study. Surrounding marrow edema appears slightly improved, particularly along the medial aspect of the distal tibia. Unchanged small non-  fragmented osteochondral lesion of the lateral talar dome. Subtalar Joints/Sinus Tarsi: Normal subtalar joints. No subtalar joint effusion. Normal sinus tarsi. Bones: No fracture or dislocation. Soft Tissue: No soft tissue mass or fluid collection. IMPRESSION: 1. 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 The degree of surrounding marrow edema has slightly decreased. 2. Unchanged small non-fragmented osteochondral lesion of the lateral talar dome. Electronically Signed   By: Titus Dubin M.D.   On: 08/24/2018 17:31     Assessment & Plan:  Plan  I am having Fantasy LRonnald Ramp maintain her aspirin, Vitamin D (Ergocalciferol), ranitidine, ALPRAZolam, acyclovir, and atorvastatin.  No orders of the defined types were placed in this encounter.   Problem List Items Addressed This Visit      Unprioritized   Chronic nonintractable headache    New--  Pt with worrisome fam hx --- cva Since problem is new and with fam hx---  Check labs and MRI  F/u neuro if needed       Relevant Orders   MR Brain Wo Contrast   CBC with Differential/Platelet   Comprehensive metabolic panel   TSH   Vitamin B12   Vitamin D 1,25 dihydroxy   Hyperlipidemia    Check labs Tolerating statin, encouraged heart healthy diet, avoid trans fats, minimize simple carbs and saturated fats. Increase exercise as tolerated      Relevant Orders   Lipid panel   Lightheadedness - Primary    ? Serous otitis---  flonase and otc antihistamine Pt has neuro and will f/u with them as well      Relevant Orders   EKG 12-Lead (Completed)   CBC with Differential/Platelet   Comprehensive metabolic panel   TSH   Vitamin B12   Vitamin D 1,25 dihydroxy      Follow-up: Return in about 3 months (around 01/14/2019) for fasting, annual exam.  [name], DO

## 2018-10-16 ENCOUNTER — Other Ambulatory Visit: Payer: Self-pay | Admitting: Family Medicine

## 2018-10-16 DIAGNOSIS — G4700 Insomnia, unspecified: Secondary | ICD-10-CM

## 2018-10-16 MED ORDER — ALPRAZOLAM 0.25 MG PO TABS: 0.2500 mg | ORAL_TABLET | Freq: Two times a day (BID) | ORAL | 0 refills | Status: DC | PRN

## 2018-10-16 NOTE — Telephone Encounter (Signed)
done

## 2018-10-17 NOTE — Telephone Encounter (Signed)
Patient notified yesterday that medication was sent in.

## 2018-10-19 LAB — VITAMIN D 1,25 DIHYDROXY
VITAMIN D 1, 25 (OH) TOTAL: 56 pg/mL (ref 18–72)
VITAMIN D3 1, 25 (OH): 21 pg/mL
Vitamin D2 1, 25 (OH)2: 35 pg/mL

## 2018-10-24 ENCOUNTER — Other Ambulatory Visit: Payer: Self-pay | Admitting: *Deleted

## 2018-10-24 MED ORDER — VITAMIN D (ERGOCALCIFEROL) 1.25 MG (50000 UNIT) PO CAPS: 50000.0000 [IU] | ORAL_CAPSULE | ORAL | 0 refills | Status: DC

## 2018-10-24 NOTE — Progress Notes (Signed)
vu

## 2018-11-26 ENCOUNTER — Other Ambulatory Visit: Payer: Self-pay | Admitting: Family Medicine

## 2018-12-21 ENCOUNTER — Other Ambulatory Visit: Payer: Self-pay

## 2018-12-21 ENCOUNTER — Ambulatory Visit: Payer: 59 | Admitting: Family Medicine

## 2018-12-21 VITALS — BP 124/88 | HR 97 | Temp 98.2°F | Wt 145.0 lb

## 2018-12-21 DIAGNOSIS — F418 Other specified anxiety disorders: Secondary | ICD-10-CM | POA: Diagnosis not present

## 2018-12-21 DIAGNOSIS — G44219 Episodic tension-type headache, not intractable: Secondary | ICD-10-CM

## 2018-12-21 NOTE — Progress Notes (Signed)
Subjective:    Patient ID: [name], female    DOB: [birth year], 53 y.o.   MRN: [id number]  HPI   Patient presents to clinic due to headache last night, then elevated BP.  Patient states headache has now resolved, but is very concerned about elevated blood pressure reading she got last night.  States she checked her BP, and got a reading of 151/103, then check it again got a reading of 157/106.  Patient states this made her feel very anxious and she was concerned that she might have a heart attack.  Patient wanted to come in today to have her blood pressure checked and to have her lungs and heart listened to.  Denies any chest pain or shortness of breath.  Denies palpitations.  Denies any neurological deficits such as speech difficulty, facial drooping, one-sided weakness, vision changes.  Patient does state sometimes when her eyes hurt from her contacts, she has a headache and suspects that is why she had a headache last night.  Did take Tylenol last night, and believes this is why her headache went away.  Patient Active Problem List   Diagnosis Date Noted  . Chronic nonintractable headache 10/15/2018  . Hyperlipidemia 10/15/2018  . 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 Lightheadedness 08/14/2016  . 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 Generalized anxiety disorder 11/02/2015  . Facial droop 12/01/2013  . Sensory disturbance 12/01/2013  . Trichomonas contact, treated 08/05/2013  . UTI 11/22/2010  . FLANK PAIN, LEFT 11/22/2010  . TOBACCO USE 10/11/2010  . BREAST CANCER, HX OF 10/11/2010   Social History   Tobacco Use  . Smoking status: Current Some Day Smoker    Packs/day: 0.25    Years: 27.00    Pack years: 6.75    Types: Cigarettes  . Smokeless tobacco: Never Used  . Tobacco comment: Trying to quit  Substance Use Topics  . Alcohol use: Yes    Alcohol/week: 0.0 standard drinks    Comment: Rare   Review of Systems  Constitutional: Negative for chills,  fatigue and fever.  HENT: Negative for congestion, ear pain, sinus pain and sore throat.   Eyes: Negative.   Respiratory: Negative for cough, shortness of breath and wheezing.   Cardiovascular: Negative for chest pain, palpitations and leg swelling. +elevated BP last night.   Gastrointestinal: Negative for abdominal pain, diarrhea, nausea and vomiting.  Genitourinary: Negative for dysuria, frequency and urgency.  Musculoskeletal: Negative for arthralgias and myalgias.  Skin: Negative for color change, pallor and rash.  Neurological: Negative for syncope, light-headedness. +headache Psychiatric/Behavioral: The patient is not nervous/anxious.       Objective:   Physical Exam Vitals signs and nursing note reviewed.  Constitutional:      General: She is not in acute distress.    Appearance: She is well-developed. She is not toxic-appearing.  HENT:     Head: Normocephalic and atraumatic.     Right Ear: External ear normal.     Left Ear: External ear normal.     Nose: Nose normal.     Mouth/Throat:     Mouth: Mucous membranes are moist.  Eyes:     General: No scleral icterus.       Right eye: No discharge.        Left eye: No discharge.     Extraocular Movements: Extraocular movements intact.     Conjunctiva/sclera: Conjunctivae normal.     Pupils: Pupils are equal, round, and reactive to light.  Neck:  Musculoskeletal: Normal range of motion and neck supple. No neck rigidity.     Trachea: No tracheal deviation.  Cardiovascular:     Rate and Rhythm: Normal rate and regular rhythm.     Heart sounds: Normal heart sounds. No murmur. No friction rub. No gallop.   Pulmonary:     Effort: Pulmonary effort is normal. No respiratory distress.     Breath sounds: Normal breath sounds. No wheezing, rhonchi or rales.  Abdominal:     General: Bowel sounds are normal.     Palpations: Abdomen is soft.     Tenderness: There is no abdominal tenderness. There is no guarding.  Musculoskeletal:  Normal range of motion.        General: No deformity.  Lymphadenopathy:     Cervical: No cervical adenopathy.  Skin:    General: Skin is warm and dry.     Capillary Refill: Capillary refill takes less than 2 seconds.     Coloration: Skin is not pale.     Findings: No erythema.  Neurological:     Mental Status: She is alert and oriented to person, place, and time.     Gait: Gait normal.  Psychiatric:        Mood and Affect: Mood normal.        Speech: Speech normal.        Behavior: Behavior normal.        Thought Content: Thought content normal.    Vitals:   12/21/18 1212  BP: 124/88  Pulse: 97  Temp: 98.2 F (36.8 C)  SpO2: 95%      Assessment & Plan:   Episodic tension-type headache- patient had headache last night, was resolved by taking Tylenol.  Suspect headache could be related to issues the patient is having with her contact lenses.  Patient advised to monitor for any alarm symptoms such as thunderclap pain in head, visual loss, speech difficulty, facial droop, one-sided weakness and to call 911 right away if any of these occur.  Anxiety about health-patient reassured that her blood pressure looks wonderful in clinic today, lungs and heart sound good.  Patient advised that if she does get an elevated BP reading, to take some deep breaths sit and rest/relax and to not check blood pressure again for at least 15 minutes.  Advised patient that if we get high BP reading, we often become nervous and stressed, for recheck BP again right away it usually will be even higher and not be a true or accurate reading.  Patient feels much better after these reassurances.  She will keep regular follow-up with PCP as planned.  Advised to return to clinic sooner if any issues arise.

## 2018-12-23 ENCOUNTER — Telehealth: Payer: Self-pay | Admitting: *Deleted

## 2018-12-23 NOTE — Telephone Encounter (Signed)
Received after hour call from 12/21/18.    "Caller states her BP is high 143/109 at 0645am today, HR is up, 111 bpm, and she has a headache.  Headache started yesterday."  Patient was seen on 12/21/18 at [facility] office.

## 2018-12-30 ENCOUNTER — Telehealth: Payer: Self-pay

## 2018-12-30 ENCOUNTER — Telehealth (INDEPENDENT_AMBULATORY_CARE_PROVIDER_SITE_OTHER): Payer: 59 | Admitting: Family Medicine

## 2018-12-30 ENCOUNTER — Other Ambulatory Visit: Payer: Self-pay

## 2018-12-30 DIAGNOSIS — R51 Headache: Secondary | ICD-10-CM

## 2018-12-30 DIAGNOSIS — R519 Headache, unspecified: Secondary | ICD-10-CM

## 2018-12-30 DIAGNOSIS — G8929 Other chronic pain: Secondary | ICD-10-CM

## 2018-12-30 MED ORDER — ESCITALOPRAM OXALATE 10 MG PO TABS: 10.0000 mg | ORAL_TABLET | Freq: Every day | ORAL | 3 refills | Status: DC

## 2018-12-30 NOTE — Patient Instructions (Signed)
Start escitalopram 10mg  daily  Follow up in 3 months  Tension Headache, Adult A tension headache is pain, pressure, or aching in your head. Tension headaches can last from 30 minutes to several days. Follow these instructions at home: Managing pain  Take over-the-counter and prescription medicines only as told by your doctor.  When you have a headache, lie down in a dark, quiet room.  If told, put ice on your head and neck: ? Put ice in a plastic bag. ? Place a towel between your skin and the bag. ? Leave the ice on for 20 minutes, 2-3 times a day.  If told, put heat on the back of your neck. Do this as often as your doctor tells you to. Use the kind of heat that your doctor recommends, such as a moist heat pack or a heating pad. ? Place a towel between your skin and the heat. ? Leave the heat on for 20-30 minutes. ? Remove the heat if your skin turns bright red. Eating and drinking  Eat meals on a regular schedule.  Watch how much alcohol you drink: ? If you are a woman and are not pregnant, do not drink more than 1 drink a day. ? If you are a man, do not drink more than 2 drinks a day.  Drink enough fluid to keep your pee (urine) pale yellow.  Do not use a lot of caffeine, or stop using caffeine. Lifestyle  Get enough sleep. Get 7-9 hours of sleep each night. Or get the amount of sleep that your doctor tells you to.  At bedtime, remove all electronic devices from your room. Examples of electronic devices are computers, phones, and tablets.  Find ways to lessen your stress. Some things that can lessen stress are: ? Exercise. ? Deep breathing. ? Yoga. ? Music. ? Positive thoughts.  Sit up straight. Do not tighten (tense) your muscles.  Do not use any products that have nicotine or tobacco in them, such as cigarettes and e-cigarettes. If you need help quitting, ask your doctor. General instructions   Keep all follow-up visits as told by your doctor. This is  important.  Avoid things that can bring on headaches. Keep a journal to find out if certain things bring on headaches. For example, write down: ? What you eat and drink. ? How much sleep you get. ? Any change to your diet or medicines. Contact a doctor if:  Your headache does not get better.  Your headache comes back.  You have a headache and sounds, light, or smells bother you.  You feel sick to your stomach (nauseous) or you throw up (vomit).  Your stomach hurts. Get help right away if:  You suddenly get a very bad headache along with any of these: ? A stiff neck. ? Feeling sick to your stomach. ? Throwing up. ? Feeling weak. ? Trouble seeing. ? Feeling short of breath. ? A rash. ? Feeling unusually sleepy. ? Trouble speaking. ? Pain in your eye or ear. ? Trouble walking or balancing. ? Feeling like you will pass out (faint). ? Passing out. Summary  A tension headache is pain, pressure, or aching in your head.  Tension headaches can last from 30 minutes to several days.  Lifestyle changes and medicines may help relieve pain. This information is not intended to replace advice given to you by your health care provider. Make sure you discuss any questions you have with your health care provider. Document Released: 12/20/2009 Document  Revised: 01/05/2017 Document Reviewed: 01/05/2017 Elsevier Interactive Patient Education  2019 Elsevier Inc.   Escitalopram oral solution What is this medicine? ESCITALOPRAM (es sye TAL oh pram) is used to treat depression and certain types of anxiety. This medicine may be used for other purposes; ask your health care provider or pharmacist if you have questions. COMMON BRAND NAME(S): Lexapro What should I tell my health care provider before I take this medicine? They need to know if you have any of these conditions: -bipolar disorder or a family history of bipolar disorder -diabetes -glaucoma -heart disease -kidney or liver  disease -receiving electroconvulsive therapy -seizures (convulsions) -suicidal thoughts, plans, or attempt by you or a family member -an unusual or allergic reaction to escitalopram, citalopram, other medicines, foods, dyes, or preservatives -pregnant or trying to become pregnant -breast-feeding How should I use this medicine? Take this medicine by mouth. Follow the directions on the prescription label. Use a specially marked spoon or container to measure your medicine. Ask your pharmacist if you do not have one. Household spoons are not accurate. This medicine can be taken with or without food. Take your medicine at regular intervals. Do not take it more often than directed. Do not stop taking this medicine suddenly except upon the advice of your doctor. Stopping this medicine too quickly may cause serious side effects or your condition may worsen. A special MedGuide will be given to you by the pharmacist with each prescription and refill. Be sure to read this information carefully each time. Talk to your pediatrician regarding the use of this medicine in children. Special care may be needed. Overdosage: If you think you have taken too much of this medicine contact a poison control center or emergency room at once. NOTE: This medicine is only for you. Do not share this medicine with others. What if I miss a dose? If you miss a dose, take it as soon as you can. If it is almost time for your next dose, take only that dose. Do not take double or extra doses. What may interact with this medicine? Do not take this medicine with any of the following medications: -certain medicines for fungal infections like fluconazole, itraconazole, ketoconazole, posaconazole, voriconazole -cisapride -citalopram -dofetilide -dronedarone -linezolid -MAOIs like Carbex, Eldepryl, Marplan, Nardil, and Parnate -methylene blue (injected into a vein) -pimozide -thioridazine -ziprasidone This medicine may also  interact with the following medications: -alcohol -amphetamines -aspirin and aspirin-like medicines -carbamazepine -certain medicines for depression, anxiety, or psychotic disturbances -certain medicines for migraine headache like almotriptan, eletriptan, frovatriptan, naratriptan, rizatriptan, sumatriptan, zolmitriptan -certain medicines for sleep -certain medicines that treat or prevent blood clots like warfarin, enoxaparin, and dalteparin -cimetidine -diuretics -fentanyl -furazolidone -isoniazid -lithium -metoprolol -NSAIDs, medicines for pain and inflammation, like ibuprofen or naproxen -other medicines that prolong the QT interval (cause an abnormal heart rhythm) -procarbazine -rasagiline -supplements like St. John's wort, kava kava, valerian -tramadol -tryptophan This list may not describe all possible interactions. Give your health care provider a list of all the medicines, herbs, non-prescription drugs, or dietary supplements you use. Also tell them if you smoke, drink alcohol, or use illegal drugs. Some items may interact with your medicine. What should I watch for while using this medicine? Tell your doctor if your symptoms do not get better or if they get worse. Visit your doctor or health care professional for regular checks on your progress. Because it may take several weeks to see the full effects of this medicine, it is important to continue your  treatment as prescribed by your doctor. Patients and their families should watch out for new or worsening thoughts of suicide or depression. Also watch out for sudden changes in feelings such as feeling anxious, agitated, panicky, irritable, hostile, aggressive, impulsive, severely restless, overly excited and hyperactive, or not being able to sleep. If this happens, especially at the beginning of treatment or after a change in dose, call your health care professional. [name] may get drowsy or dizzy. Do not drive, use machinery, or do  anything that needs mental alertness until you know how this medicine affects you. Do not stand or sit up quickly, especially if you are an older patient. This reduces the risk of dizzy or fainting spells. Alcohol may interfere with the effect of this medicine. Avoid alcoholic drinks. Your mouth may get dry. Chewing sugarless gum or sucking hard candy, and drinking plenty of water may help. Contact your doctor if the problem does not go away or is severe. What side effects may I notice from receiving this medicine? Side effects that you should report to your doctor or health care professional as soon as possible: -allergic reactions like skin rash, itching or hives, swelling of the face, lips, or tongue -anxious -black, tarry stools -changes in vision -confusion -elevated mood, decreased need for sleep, racing thoughts, impulsive behavior -eye pain -fast, irregular heartbeat -feeling faint or lightheaded, falls -feeling agitated, angry, or irritable -hallucination, loss of contact with reality -loss of balance or coordination -loss of memory -restlessness, pacing, inability to keep still -seizures -stiff muscles -suicidal thoughts or other mood changes -trouble sleeping -unusual bleeding or bruising -unusually weak or tired -vomiting Side effects that usually do not require medical attention (report to your doctor or health care professional if they continue or are bothersome): -changes in appetite -change in sex drive or performance -headache -increased sweating -indigestion, nausea -tremors Ths list may not describe all possible side effects. Call your doctor for medical advice about side effects. You may report side effects to FDA at 1-800-FDA-1088. This list may not describe all possible side effects. Call your doctor for medical advice about side effects. You may report side effects to FDA at 1-800-FDA-1088. Where should I keep my medicine? Keep out of reach of children.  Store at room temperature between 15 and 30 degrees C (59 and 86 degrees F). Throw away any unused medicine after the expiration date. NOTE: This sheet is a summary. It may not cover all possible information. If you have questions about this medicine, talk to your doctor, pharmacist, or health care provider.  2019 Elsevier/Gold Standard (2016-02-26 15:26:08)

## 2018-12-30 NOTE — Progress Notes (Signed)
Thank you Amy! Made any corrections needed, and agree with history, physical, neuro exam,assessment and plan as stated.     Sarina Ill, MD [facility]

## 2018-12-30 NOTE — Telephone Encounter (Signed)
Spoke with the patient and she has consented to doing a telephone call at her appointment time.  Concerns: she mentioned that for the last month she has been experiencing headaches and dizziness that comes and goes. She stated that it doesn't happen everyday and that she is still able to do function with day to day task. Patient stated that she felt fine while she was talking to me. No medication has been taken at present.

## 2018-12-30 NOTE — Telephone Encounter (Signed)
Telephone visit completed. Please see encounter notes

## 2018-12-30 NOTE — Progress Notes (Signed)
PATIENT: [name] DOB: 11/26/1965  REASON FOR VISIT: follow up HISTORY FROM: patient  Virtual Visit via Telephone Note  I connected with [name] on 12/30/18 at  2:30 PM EDT by telephone and verified that I am speaking with the correct person using two identifiers.   I discussed the limitations, risks, security and privacy concerns of performing an evaluation and management service by telephone and the availability of in person appointments. I also discussed with the patient that there may be a patient responsible charge related to this service. The patient expressed understanding and agreed to proceed.   History of Present Illness:  12/30/18 [name] is a 53 y.o. female chronic headaches.  She reports that over the last month she has started having more frequent headaches.  These are very similar to previous headaches.  She had an MRI in 2017 that was unremarkable.  She describes the headaches as a pressure sensation typically on one side of her head.  The pain may be a various locations.  Pain typically lasts 30 minutes to a couple hours and then resolves.  She does take ibuprofen or Aleve about 1-2 times a week.  She has not determined any triggering factors.  She does not typically feel the headache if she is active.  She does not feel that these are limiting her in any way.  She was advised to start S-Citalopram 10 mg at her last visit 2018.  She states that she never started this medication.  She is interested in starting escitalopram to see if this helps with the frequency of her headaches.  She does not feel that she is under any increased stress at a time but states this has been a problem in the past.  She is also had corneal surgery in the past.  She reports that her prescription contacts are due for adjustment.  She will schedule follow-up with her ophthalmologist as soon as possible.  She denies any paresthesias.  She denies any chest pain, trouble breathing or  palpitations.  She denies any other vision changes.  HISTORY: (copied from [name] note on 08/15/2017) Interval history 08/15/2017: patient returns for left-sided paresthesias, multiple MRIs, EEGs and other workup have been negative for etiology possibly anxiety. 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 The lest-sided symptoms happen when she gets very anxious. Still seeing Mary. Ernesto Valdez, no more bad smells. She has not had to use her xanax still has a prescription from march. Anxiety improved on Lexapro. Takes ASA 81mg  daily for stroke prevention.  Interval history 11/20/2016: She was started on Lexaproand xanax for anxiety and levocetirizine for insomnia- she has not started the Lexapro and I encouraged her to start it for her anxiety which may be the cause of her symptoms or at least exacerbating them.She still has left-sided numbness which makes her even more anxious. 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 She denies headaches. Routine EEG was normal. She had a normal stroke workup in 2015 for facial droop.   Today she says if she gets sick or anxious she has left-sided tingling. She has had episodes of feeling sick to her stomach and dizziness, left back of her head numb and numb in the should and calf or numb in the left hand and foot. Also  numbness on the left side of her face. No other issues. No weakness. No focal deficits. No headache. Lightheaded during the episodes. Not breathing hard but she did start stressing. She drank some caffeine which resolved the symptoms, she was told she wass going through caffeine withdrawal. It still makes her significantly anxious.Xanax will also resolve the tingling.  Interval history 08/08/2016: She saw Mary.  Ernesto Valdez and she was treated, she was treated for sinus disease and the abnormal smells resolved. Abnormal smells resolved. No further episodes. Feeling well. No issues. Discussed MRI of the brain, she should have repeat MRI every few years to follow pituitary unless she has symptoms such as vision changes and headaches or anything concerning then needs repeat sooner. F/u one year.  PIR:Mary L Jonesis a 53 y.o.femalehere as a referral from Mary. Marlyne Valdez abnormal smells new onset recently and left-sided paresthesias for 6 years. Past medical history anxiety, HLD. She recently has episode of smelling gas/diesel that is not there. She is under a lot of stress due to her Aunt's AML recently placed into hospice. During an episode of anxiety she smelled diesel and gas, brief, had multiple episodes over 4-6 days. Her aunt is dying, she is having a hard time with it. She has tingling left side of the body usually worse when she is nervous/anxious. She had the abnormal smells off an or for 4 days, would last briefly. Her whole left side is tingly and heavy. Can last briefly, worse with stress. No weakness. No aphasia or dysarthria. She has difficulty concentrating, worse with stress. She has difficulty sleeping. She has insomnia. Sleeping is poor. No head trauma or other inciting events. She is feeling very fatigued, tired, decreased concentration. She is scared about her aunt dying. She is afraid she is having TIAs as well. She smoked for 10 years, she is trying to quit. She has 3 sisters, she handles everything for her Aunt who is like her mother. No headaches, no new vision changes, no other focal neurologic deficits. Alprazolam helps with her symptoms. She was hospitalized 6 years ago and evaluation for stroke was negative. 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 She had some mild dysfunction. 3 days previously she began to intermittently smelling odor as a mix between chemical and diesel fuel. In the smell last for a maximum of 3 minutes at a time and abruptly disappears. She also has anxiety with tingling in her limbs. But the symptoms are not new for her. She denied any new fevers chills, headache, neurologic deficits, difficulty breathing, nausea vomiting or other complaints.Exam was normal including general exam and neurologic exam. Since there are no indications of neurologic abnormalities or functional deficits aresigns of seizures they diagnosed her with possible anxiety and referral to neurology. No imaging was completed.  Patient was also seen in 2015 and admitted for right facial droop. Imaging showed no stroke. No signs of MS or other etiologies and MRI. No stenosis or significant issues on vascular imaging. No resultant neuro deficits. Was not felt to be a stroke.  CMP normal.  LDL 85.   FINDINGS: MRI HEAD 2015 (personally reviewed images and agree with the following)  No evidence for acute infarction, hemorrhage, mass lesion, hydrocephalus, or extra-axial fluid. No atrophy or white matter disease. Flow voids are maintained throughout the carotid, basilar, and  vertebral arteries. There are no areas of chronic hemorrhage. Pituitary, pineal, and cerebellar tonsils unremarkable. No upper cervical lesions.  Negative orbits, and osseous structures. Chronic bilateral maxillary sinus disease. Chronic bilateral ethmoid sinus disease. 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 Trace left mastoid effusion.  Good general agreement prior CT.  MRA HEAD FINDINGS  Internal carotid arteries widely patent. Basilar artery widely patent with left greater than right vertebrals contributing. Fetal origin left PCA. 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 No intracranial stenosis or dissection. No visible intracranial aneurysm.   Observations/Objective:  Generalized: Well developed, in no acute distress  Mentation: Alert oriented to time, place, history taking. Follows all commands speech and language fluent   Assessment and Plan:  53 y.o. year old female  has a past medical history of Anxiety, Breast cancer ([place]) (1999), Headache, History of exercise stress test, Hyperlipidemia, and Numbness. here with    ICD-10-CM   1. Chronic nonintractable headache, unspecified headache type R51    After discussion with patient she does wish to start escitalopram.  I have called in a new prescription and instructed her on proper administration.  I have advised not to abruptly discontinue this medication without consulting me.  She was encouraged to continue smoking cessation.  We have also discussed limiting her caffeine intake and stress as much as possible.  Her PCP has ordered an MRI but she does not feel this is necessary at this time.  We will follow-up with her in 3 months to determine need for MRI and response to medication.  She was advised to reach out via my chart or call the office with any concerns or questions.  No orders of the defined types were placed in this encounter.   Meds ordered this encounter  Medications   escitalopram (LEXAPRO) 10 MG tablet    Sig: Take 1 tablet (10 mg total) by mouth daily.    Dispense:  90 tablet    Refill:  3    Order Specific Question:   Supervising Provider    Answer:   Melvenia Beam [id number]     Follow Up Instructions:  I discussed the assessment and treatment plan with the patient. The patient was provided an opportunity to ask questions and all were answered. The patient agreed with the plan and demonstrated an understanding of the instructions.   The patient was advised to call back or seek an in-person evaluation if  the symptoms worsen or if the condition fails to improve as anticipated.  I provided 25 minutes of non-face-to-face time during this encounter.   Debbora Presto, NP

## 2018-12-31 ENCOUNTER — Ambulatory Visit: Payer: 59 | Admitting: Neurology

## 2018-12-31 ENCOUNTER — Ambulatory Visit: Payer: Self-pay | Admitting: *Deleted

## 2018-12-31 NOTE — Telephone Encounter (Signed)
Patient called in and stated she is upset on the stomach and some dizzines , her pcp gave her a tablet to place under her tongue to calm the nausea down and she wants to know if its safe to try. She say she thiks it may be zofran

## 2018-12-31 NOTE — Telephone Encounter (Signed)
Contacted pt regarding her symptoms; she states that she had previously talked with Dr Etter Sjogren about her dizziness  and headaches, and informed Dr Etter Sjogren that her neurologist told her that she may be having cluster headaches; she states that this morning she had an episode at 0630, but it has since resolved; this week the pt says she had 2 headache episodes in the past 7 days, however her nausea has been ongoing for a week; the pt says that the dizziness causes the headache and nausea; recommendations made per nurse triage protocol; the pt is agreeable to televisit or coming in for an office visit; unable to contact flow coordinator via phone at [facility] to schedule the pt; the pt can be contacted at [phone], and a message can be left on the voicemail; the pt says that she does not have a computerwill route to office for notification.  Reason for Disposition . [1] MODERATE dizziness (e.g., interferes with normal activities) AND [2] has been evaluated by physician for this  Answer Assessment - Initial Assessment Questions 1. NAUSEA SEVERITY: "How bad is the nausea?" (e.g., mild, moderate, severe; dehydration, weight loss)   - MILD: loss of appetite without change in eating habits   - MODERATE: decreased oral intake without significant weight loss, dehydration, or malnutrition   - SEVERE: inadequate caloric or fluid intake, significant weight loss, symptoms of dehydration     Mild to moderate 2. ONSET: "When did the nausea begin?"   12/24/2018 3. VOMITING: "Any vomiting?" If so, ask: "How many times today?"    no 4. RECURRENT SYMPTOM: "Have you had nausea before?" If so, ask: "When was the last time?" "What happened that time?"     no 5. CAUSE: "What do you think is causing the nausea?"     Maybe headaches 6. PREGNANCY: "Is there any chance you are pregnant?" (e.g., unprotected intercourse, missed birth control pill, broken condom)     No hysterectomy  Answer Assessment - Initial Assessment  Questions 1. DESCRIPTION: "Describe your dizziness."     lightheaded 2. LIGHTHEADED: "Do you feel lightheaded?" (e.g., somewhat faint, woozy, weak upon standing)  woozy 3. VERTIGO: "Do you feel like either you or the room is spinning or tilting?" (i.e. vertigo)     no 4. SEVERITY: "How bad is it?"  "Do you feel like you are going to faint?" "Can you stand and walk?"   - MILD - walking normally   - MODERATE - interferes with normal activities (e.g., work, school)    - SEVERE - unable to stand, requires support to walk, feels like passing out now.    mild 5. ONSET:  "When did the dizziness begin?"     12/31/2018 at 0630; lasted about 15 sec 6. AGGRAVATING FACTORS: "Does anything make it worse?" (e.g., standing, change in head position)     no 7. HEART RATE: "Can you tell me your heart rate?" "How many beats in 15 seconds?"  (Note: not all patients can do this)      26 beats in 15 seconds 8. CAUSE: "What do you think is causing the dizziness?"    headaches 9. RECURRENT SYMPTOM: "Have you had dizziness before?" If so, ask: "When was the last time?" "What happened that time?"     Seen in office; given meclizine and zofran 10. OTHER SYMPTOMS: "Do you have any other symptoms?" (e.g., fever, chest pain, vomiting, diarrhea, bleeding)       nausea 11. PREGNANCY: "Is there any chance you are pregnant?" "  When was your last menstrual period?"     No hysterectomy  Protocols used: DIZZINESS - LIGHTHEADEDNESS-A-AH, NAUSEA-A-AH

## 2018-12-31 NOTE — Telephone Encounter (Signed)
Has she contacted her neuro?   Happy to do web ex but she may want to contact neuro

## 2018-12-31 NOTE — Telephone Encounter (Signed)
Patient stated that she spoke with neuro yesterday and they advised that she should take the lexapro every day and that's it.  They did not give her anything for the nausea and dizziness.  She did state that Edward back in 2018 gave her a couple of pills that helped with these symptoms.  They were meclizine and zofran.  Can we send those in for her?  If so can you go ahead and send in to [facility] on [address].

## 2019-01-01 ENCOUNTER — Ambulatory Visit: Payer: 59 | Admitting: Family

## 2019-01-01 ENCOUNTER — Other Ambulatory Visit: Payer: Self-pay | Admitting: Family Medicine

## 2019-01-01 ENCOUNTER — Telehealth (INDEPENDENT_AMBULATORY_CARE_PROVIDER_SITE_OTHER): Payer: 59 | Admitting: Family

## 2019-01-01 ENCOUNTER — Other Ambulatory Visit: Payer: Self-pay

## 2019-01-01 DIAGNOSIS — R42 Dizziness and giddiness: Secondary | ICD-10-CM

## 2019-01-01 DIAGNOSIS — R110 Nausea: Secondary | ICD-10-CM | POA: Diagnosis not present

## 2019-01-01 DIAGNOSIS — R519 Headache, unspecified: Secondary | ICD-10-CM

## 2019-01-01 DIAGNOSIS — R51 Headache: Secondary | ICD-10-CM | POA: Diagnosis not present

## 2019-01-01 DIAGNOSIS — I10 Essential (primary) hypertension: Secondary | ICD-10-CM | POA: Diagnosis not present

## 2019-01-01 MED ORDER — MECLIZINE HCL 25 MG PO TABS: 25.0000 mg | ORAL_TABLET | Freq: Three times a day (TID) | ORAL | 0 refills | Status: DC | PRN

## 2019-01-01 MED ORDER — AMLODIPINE BESYLATE 2.5 MG PO TABS: 2.5000 mg | ORAL_TABLET | Freq: Every day | ORAL | 2 refills | Status: DC

## 2019-01-01 MED ORDER — ONDANSETRON HCL 4 MG PO TABS: 4.0000 mg | ORAL_TABLET | Freq: Three times a day (TID) | ORAL | 0 refills | Status: DC | PRN

## 2019-01-01 NOTE — Telephone Encounter (Signed)
I have spoken with patient. She has had some nausea and mild dizziness for several mornings. She reports that when she has symptoms her blood pressures have been elevated. She does not have a history of HTN. She is scheduled for a telephone visit with her PCP today to discuss BP and potential management. She was also inquiring about taking zofran for nausea. She was advised to follow up closely and follow recommendations of PCP in regards to BP. I have also educated her on Zofran and I feel that it is safe for her to take as prescribed. She was advised to call with any additional concerns.

## 2019-01-01 NOTE — Telephone Encounter (Signed)
Sorry did not see meclizine--- it was sent

## 2019-01-01 NOTE — Telephone Encounter (Signed)
zofran sent

## 2019-01-01 NOTE — Telephone Encounter (Signed)
Sent message to [facility] that we have sent in meds for her.  She had a webex visit at time message was received.

## 2019-01-01 NOTE — Telephone Encounter (Signed)
Can she get something for the dizziness?

## 2019-01-01 NOTE — Progress Notes (Signed)
Virtual Visit via Telephone Note  I connected with [name] on 01/01/19 at 11:20 AM EDT by telephone and verified that I am speaking with the correct person using two identifiers.   I discussed the limitations, risks, security and privacy concerns of performing an evaluation and management service by telephone and the availability of in person appointments. I also discussed with the patient that there may be a patient responsible charge related to this service. The patient expressed understanding and agreed to proceed.   History of Present Illness:  Patient is a 53 yr old female who presents today with chief complaint of nausea and HA.  Reports that she came in 1.5 months ago with HA.  Was sent to neurology.  Call neuro 2 days ago told likely cluster HA's.  Reports Sunday, Monday she was fine, then Tuesday was having dizzy spells and nausea. Drinking juice and water.  Reports that she when she woke up had nausea and pain in the top of her head.  Laid back down.  Reports no fever. Dizziness occurs with movement and with sitting still.    BP Readings from Last 3 Encounters:  12/21/18 124/88  10/15/18 122/88  06/14/18 104/82   Reports that typically "I never get headaches."   140/110 bp earlier this AM, then 122/79, current bp  125/94, HR 96.       Observations/Objective: Gen: calm, pleasant Resp: speech is fluid, no obvious dyspnea with talking. Psych: normal affect Neuro: A and O x 3, speech is clear.   Assessment and Plan: HA- ? Secondary to mild HTN.  DBP has been high. Will initiate amlodipine 2.5mg  once daily. She is advised to check bp once daily at home x 1 week and forward her readings to [place] via mychart in 1 week.  Also for dizziness- refill meclizine and refill zofran prn nausea.   Follow Up Instructions:    I discussed the assessment and treatment plan with the patient. The patient was provided an opportunity to ask questions and all were answered. The patient agreed  with the plan and demonstrated an understanding of the instructions.   The patient was advised to call back or seek an in-person evaluation if the symptoms worsen or if the condition fails to improve as anticipated.  I provided 15 minutes of non-face-to-face time during this encounter.   Nance Pear, NP

## 2019-01-16 ENCOUNTER — Encounter: Payer: Self-pay | Admitting: Family Medicine

## 2019-01-16 ENCOUNTER — Telehealth: Payer: Self-pay | Admitting: Family Medicine

## 2019-01-16 ENCOUNTER — Other Ambulatory Visit: Payer: Self-pay

## 2019-01-16 ENCOUNTER — Ambulatory Visit (INDEPENDENT_AMBULATORY_CARE_PROVIDER_SITE_OTHER): Payer: 59 | Admitting: Family Medicine

## 2019-01-16 ENCOUNTER — Telehealth: Payer: Self-pay

## 2019-01-16 VITALS — Ht 66.0 in | Wt 147.0 lb

## 2019-01-16 DIAGNOSIS — I10 Essential (primary) hypertension: Secondary | ICD-10-CM | POA: Diagnosis not present

## 2019-01-16 DIAGNOSIS — G4700 Insomnia, unspecified: Secondary | ICD-10-CM

## 2019-01-16 MED ORDER — AMLODIPINE BESYLATE 5 MG PO TABS: 5.0000 mg | ORAL_TABLET | Freq: Every day | ORAL | 3 refills | Status: DC

## 2019-01-16 MED ORDER — ALPRAZOLAM 0.25 MG PO TABS: 0.2500 mg | ORAL_TABLET | Freq: Two times a day (BID) | ORAL | 0 refills | Status: DC | PRN

## 2019-01-16 NOTE — Progress Notes (Signed)
Virtual Visit via Video Note  I connected with [name] on 01/16/19 at  3:30 PM EDT by a video enabled telemedicine application and verified that I am speaking with the correct person using two identifiers.   I discussed the limitations of evaluation and management by telemedicine and the availability of in person appointments. The patient expressed understanding and agreed to proceed.  History of Present Illness: Pt is home c/o elevated bp   bp still running high.  It causes mild headache at times.  No cp, no sob , no palpitations   Hr has been running high as well.  No other complaints.  Her anxiety has been a little bit of an issue due to covid and high bp.  She also c/o sl weight gain.     Provider --  Working from home  Observations/Objective: 124/94    This am 135/96    Repeat 120/98 115 Pt in NAD Not toxic  Neatly dressed  Assessment and Plan: 1. Essential hypertension Poorly controlled will alter medications, encouraged DASH diet, minimize caffeine and obtain adequate sleep. Report concerning symptoms and follow up as directed and as needed - amLODipine (NORVASC) 5 MG tablet; Take 1 tablet (5 mg total) by mouth daily.  Dispense: 90 tablet; Refill: 3  2. Insomnia, unspecified type Having a little more trouble with anxiety / sleep due to bp being high and covid  Refill med  F/u prn  - ALPRAZolam (XANAX) 0.25 MG tablet; Take 1 tablet (0.25 mg total) by mouth 2 (two) times daily as needed. for anxiety  Dispense: 15 tablet; Refill: 0  Follow Up Instructions:   rto 2 weeks or sooner prn  I discussed the assessment and treatment plan with the patient. The patient was provided an opportunity to ask questions and all were answered. The patient agreed with the plan and demonstrated an understanding of the instructions.   The patient was advised to call back or seek an in-person evaluation if the symptoms worsen or if the condition fails to improve as anticipated.  I provided 25  minutes of non-face-to-face time during this encounter.   [name], DO

## 2019-01-16 NOTE — Telephone Encounter (Signed)
Copied from [place] [id number]. Topic: Quick Communication - See Telephone Encounter >> Jan 16, 2019  9:52 AM Loma Boston wrote: CRM for notification. See Telephone encounter for: 01/16/19.

## 2019-01-16 NOTE — Telephone Encounter (Signed)
See additional encounter from 01/16/19

## 2019-01-16 NOTE — Telephone Encounter (Signed)
Spoke with pt and scheduled Doxy visit at 3:30 with PCP today.

## 2019-01-16 NOTE — Telephone Encounter (Signed)
Copied from [place] [id number]. Topic: Quick Communication - See Telephone Encounter >> Jan 16, 2019 10:02 AM Loma Boston wrote: CRM for notification. See Telephone encounter for: 01/16/19.CRM for notification. See Telephone encounter for: 01/16/19.amLODipine (NORVASC) 2.5 MG tablet Pt is taking only once a day, 2.5 since 3/25. Her BP was running 132/97, 99. This got her BP down and stopped her headaches. This week her BP started creeping back up thus the headaches again. She started taking 2 pills instead of the one to see if that changed things, early morning and mid day. It did make her be like 121 /65. However she is concerned that she should not have done on her on, she tried not to do it and the bottom number is inching over 100 so she is taking another pill again. She would like some advice on how she should be handling this. She would like a call from Dr Lown or her nurse to advise.

## 2019-01-16 NOTE — Telephone Encounter (Signed)
Copied from [place] [id number]. Topic: Quick Communication - See Telephone Encounter >> Jan 16, 2019 10:02 AM Loma Boston wrote: CRM for notification. See Telephone encounter for: 01/16/19.CRM for notification. See Telephone encounter for: 01/16/19.amLODipine (NORVASC) 2.5 MG tablet Pt is taking only once a day, 2.5 since 3/25. Her BP was running 132/97, 99. This got her BP down and stopped her headaches. This week her BP started creeping back up thus the headaches again. She started taking 2 pills instead of the one to see if that changed things, early morning and mid day. It did make her be like 121 /65. However she is concerned that she should not have done on her on, she tried not to do it and the bottom number is inching over 100 so she is taking another pill again. She would like some advice on how she should be handling this. She would like a call from Dr Lown or her nurse to advise.

## 2019-01-22 ENCOUNTER — Telehealth: Payer: Self-pay | Admitting: Family Medicine

## 2019-01-22 ENCOUNTER — Telehealth: Payer: Self-pay | Admitting: *Deleted

## 2019-01-22 NOTE — Telephone Encounter (Signed)
I put a phone note in.   Note    Left message on vm for patient to call to set up appt for around 01/30/19.        01/22/19 9:40 AM  Note    ----- Message from [name], DO sent at 01/16/2019  4:20 PM EDT ----- Pt needs f/u app in 2 weeks

## 2019-01-22 NOTE — Telephone Encounter (Signed)
Left message on vm for patient to call to set up appt for around 01/30/19.

## 2019-01-22 NOTE — Telephone Encounter (Signed)
-----   Message from [name], DO sent at 01/16/2019  4:20 PM EDT ----- Pt needs f/u app in 2 weeks

## 2019-02-05 ENCOUNTER — Other Ambulatory Visit: Payer: Self-pay

## 2019-02-05 ENCOUNTER — Ambulatory Visit (INDEPENDENT_AMBULATORY_CARE_PROVIDER_SITE_OTHER): Payer: 59 | Admitting: Family Medicine

## 2019-02-05 ENCOUNTER — Encounter: Payer: Self-pay | Admitting: Family Medicine

## 2019-02-05 DIAGNOSIS — I10 Essential (primary) hypertension: Secondary | ICD-10-CM | POA: Diagnosis not present

## 2019-02-05 NOTE — Progress Notes (Signed)
Virtual Visit via Video Note  I connected with [name] on 02/05/19 at 11:15 AM EDT by a video enabled telemedicine application and verified that I am speaking with the correct person using two identifiers.   I discussed the limitations of evaluation and management by telemedicine and the availability of in person appointments. The patient expressed understanding and agreed to proceed.  History of Present Illness: Pt is at work with no complaints   Needs a bp check today  Provider ---is working at home   Past Medical History:  Diagnosis Date  . Anxiety   . Breast cancer [facility]) 1999   2004  . Headache   . History of exercise stress test    GXT 9/19:  Exercised 5'48"; METs: 7; Normal BP response; No ischemic EKG changes  . Hyperlipidemia   . Numbness    Outpatient Encounter Medications as of 02/05/2019  Medication Sig  . acyclovir (ZOVIRAX) 400 MG tablet Take 400 mg by mouth daily.   Marland Kitchen ALPRAZolam (XANAX) 0.25 MG tablet Take 1 tablet (0.25 mg total) by mouth 2 (two) times daily as needed. for anxiety  . amLODipine (NORVASC) 5 MG tablet Take 1 tablet (5 mg total) by mouth daily.  Marland Kitchen aspirin 81 MG tablet Take 1 tablet (81 mg total) by mouth daily.  Marland Kitchen atorvastatin (LIPITOR) 10 MG tablet TAKE 1 TABLET( 10 MG TOTAL) BY MOUTH DAILY AT 6:00 PM.  . escitalopram (LEXAPRO) 10 MG tablet Take 1 tablet (10 mg total) by mouth daily.  . meclizine (ANTIVERT) 25 MG tablet Take 1 tablet (25 mg total) by mouth 3 (three) times daily as needed for dizziness.  . ondansetron (ZOFRAN) 4 MG tablet Take 1 tablet (4 mg total) by mouth every 8 (eight) hours as needed for nausea or vomiting.  . ranitidine (ZANTAC) 150 MG tablet Take 1 tablet (150 mg total) by mouth 2 (two) times daily.  . Vitamin D, Ergocalciferol, (DRISDOL) 1.25 MG (50000 UT) CAPS capsule Take 1 capsule (50,000 Units total) by mouth every 7 (seven) days.  . [DISCONTINUED] amLODipine (NORVASC) 2.5 MG tablet Take 1 tablet (2.5 mg total) by mouth  daily.   No facility-administered encounter medications on file as of 02/05/2019.      Observations/Objective: 119/87  p 98----- rechecked 126/86  p 100  Assessment and Plan: 1. Essential hypertension Well controlled, no changes to meds. Encouraged heart healthy diet such as the DASH diet and exercise as tolerated.  Heart rate a little high---but she was running to the car-- she will check hr later today at rest and send a my chart message    Follow Up Instructions:    I discussed the assessment and treatment plan with the patient. The patient was provided an opportunity to ask questions and all were answered. The patient agreed with the plan and demonstrated an understanding of the instructions.   The patient was advised to call back or seek an in-person evaluation if the symptoms worsen or if the condition fails to improve as anticipated.  I provided 15 minutes of non-face-to-face time during this encounter.   [name], DO

## 2019-02-06 ENCOUNTER — Telehealth: Payer: Self-pay

## 2019-02-06 ENCOUNTER — Other Ambulatory Visit: Payer: Self-pay | Admitting: Family Medicine

## 2019-02-06 DIAGNOSIS — R000 Tachycardia, unspecified: Secondary | ICD-10-CM

## 2019-02-06 MED ORDER — METOPROLOL SUCCINATE ER 50 MG PO TB24: 50.0000 mg | ORAL_TABLET | Freq: Every day | ORAL | 0 refills | Status: DC

## 2019-02-06 NOTE — Telephone Encounter (Signed)
Copied from [place] [id number]. Topic: General - Other >> Feb 06, 2019 12:31 PM Wynetta Emery, [place] C wrote: Reason for CRM: pt called in to update PCP with her readings. Pt says when checking last night her BP was 119/76 HR 110. At 12:00p her reading was 124/78 HR 107   Please advise.

## 2019-02-06 NOTE — Addendum Note (Signed)
Addended by: Magdalene Molly A on: 02/06/2019 03:23 PM   Modules accepted: Orders

## 2019-02-06 NOTE — Telephone Encounter (Signed)
Spoke with patient and let her know that we have changed her BP medication and also she will receive a phone call about an event monitor and echo done. Patient agreed and voiced her understanding

## 2019-02-06 NOTE — Telephone Encounter (Signed)
Pulse is high--- I would like to change the bp med to toprol xl 50 mg #30  2 refills  This will lower bp and heart rate We should also get a event monitor and echo if not done recently

## 2019-02-07 ENCOUNTER — Telehealth: Payer: Self-pay | Admitting: Radiology

## 2019-02-07 NOTE — Telephone Encounter (Signed)
Enrolled patient for a 30 day [employer] Event monitor to be mailed due to covid-19. I went over brief instructions with patient and she knows to expect the monitor to arrive in 3-4 days.

## 2019-02-10 ENCOUNTER — Ambulatory Visit (INDEPENDENT_AMBULATORY_CARE_PROVIDER_SITE_OTHER): Payer: 59

## 2019-02-10 DIAGNOSIS — R002 Palpitations: Secondary | ICD-10-CM | POA: Diagnosis not present

## 2019-02-10 DIAGNOSIS — R000 Tachycardia, unspecified: Secondary | ICD-10-CM

## 2019-02-17 ENCOUNTER — Telehealth: Payer: Self-pay | Admitting: Family Medicine

## 2019-02-17 NOTE — Telephone Encounter (Signed)
Left message on pt's VM req a call bk to sched 3 mo f/u w/ Amy

## 2019-02-18 ENCOUNTER — Telehealth: Payer: Self-pay | Admitting: *Deleted

## 2019-02-18 ENCOUNTER — Other Ambulatory Visit: Payer: Self-pay | Admitting: Family Medicine

## 2019-02-18 DIAGNOSIS — G4700 Insomnia, unspecified: Secondary | ICD-10-CM

## 2019-02-18 MED ORDER — ALPRAZOLAM 0.25 MG PO TABS: 0.2500 mg | ORAL_TABLET | Freq: Two times a day (BID) | ORAL | 0 refills | Status: DC | PRN

## 2019-02-18 NOTE — Telephone Encounter (Signed)
Refill for alprazolam A; Last written: 01/16/19 Last ov:  02/05/19 Next ov: 05/08/19 Contract: will get at next visit UDS: will get at next visit

## 2019-02-18 NOTE — Telephone Encounter (Signed)
refilled 

## 2019-03-08 ENCOUNTER — Other Ambulatory Visit: Payer: Self-pay | Admitting: Family Medicine

## 2019-03-18 ENCOUNTER — Other Ambulatory Visit: Payer: Self-pay

## 2019-03-20 ENCOUNTER — Telehealth (HOSPITAL_COMMUNITY): Payer: Self-pay | Admitting: *Deleted

## 2019-03-20 ENCOUNTER — Telehealth: Payer: Self-pay | Admitting: Interventional Cardiology

## 2019-03-20 ENCOUNTER — Telehealth: Payer: Self-pay

## 2019-03-20 NOTE — Telephone Encounter (Signed)
Left message of appointment for Echo, but asked patient to call back for COVID screening

## 2019-03-20 NOTE — Telephone Encounter (Signed)
Spoke with the patient and she has given verbal consent to file her insurance and to do a mychart visit with Amy on 03/24/2019 @ 10:30 am. Patient is aware of appt date and time. Information has been sent via mychart.

## 2019-03-20 NOTE — Telephone Encounter (Signed)
Pt returned this offce cal please call her back.

## 2019-03-20 NOTE — Telephone Encounter (Signed)

## 2019-03-21 ENCOUNTER — Ambulatory Visit (HOSPITAL_COMMUNITY): Payer: 59 | Attending: Cardiology

## 2019-03-21 ENCOUNTER — Other Ambulatory Visit: Payer: Self-pay

## 2019-03-21 DIAGNOSIS — R000 Tachycardia, unspecified: Secondary | ICD-10-CM

## 2019-03-24 ENCOUNTER — Encounter: Payer: Self-pay | Admitting: Family Medicine

## 2019-03-24 ENCOUNTER — Ambulatory Visit (INDEPENDENT_AMBULATORY_CARE_PROVIDER_SITE_OTHER): Payer: 59 | Admitting: Family Medicine

## 2019-03-24 ENCOUNTER — Other Ambulatory Visit: Payer: Self-pay

## 2019-03-24 DIAGNOSIS — R51 Headache: Secondary | ICD-10-CM

## 2019-03-24 DIAGNOSIS — G8929 Other chronic pain: Secondary | ICD-10-CM

## 2019-03-24 NOTE — Progress Notes (Signed)
PATIENT: [name] DOB: 1965/12/22  REASON FOR VISIT: follow up HISTORY FROM: patient  Virtual Visit via Telephone Note  I connected with [name] on 03/24/19 at 10:30 AM EDT by telephone and verified that I am speaking with the correct person using two identifiers.   I discussed the limitations, risks, security and privacy concerns of performing an evaluation and management service by telephone and the availability of in person appointments. I also discussed with the patient that there may be a patient responsible charge related to this service. The patient expressed understanding and agreed to proceed.   History of Present Illness:  03/24/19 [name] is a 53 y.o. female here today for follow up for chronic headaches. She was advised to start escitalopram at her last visit in 12/2018.  She reports that she has taken this medication daily.  She does feel that it helps.  She has not had any migraines since last being seen.  She does continue to suffer with anxiety.  She was recently started on Xanax as needed.  She states that this medication really helps but she cannot take it every day.   HISTORY: (copied from my note on 12/30/2018)  [name] is a 53 y.o. female chronic headaches.  She reports that over the last month she has started having more frequent headaches.  These are very similar to previous headaches.  She had an MRI in 2017 that was unremarkable.  She describes the headaches as a pressure sensation typically on one side of her head.  The pain may be a various locations.  Pain typically lasts 30 minutes to a couple hours and then resolves.  She does take ibuprofen or Aleve about 1-2 times a week.  She has not determined any triggering factors.  She does not typically feel the headache if she is active.  She does not feel that these are limiting her in any way.  She was advised to start S-Citalopram 10 mg at her last visit 2018.  She states that she never started  this medication.  She is interested in starting escitalopram to see if this helps with the frequency of her headaches.  She does not feel that she is under any increased stress at a time but states this has been a problem in the past.  She is also had corneal surgery in the past.  She reports that her prescription contacts are due for adjustment.  She will schedule follow-up with her ophthalmologist as soon as possible.  She denies any paresthesias.  She denies any chest pain, trouble breathing or palpitations.  She denies any other vision changes.  Interval history 08/15/2017: patient returns for left-sided paresthesias, multiple MRIs, EEGs and other workup have been negative for etiology possibly anxiety. 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 The lest-sided symptoms happen when she gets very anxious. Still seeing Dr. Ernesto Rutherford, no more bad smells. She has not had to use her xanax still has a prescription from march. Anxiety improved on Lexapro. Takes ASA 81mg  dailyfor stroke prevention.  Interval history 11/20/2016: She was started on Lexaproand xanax for anxiety and levocetirizine for insomnia- she has not started the Lexapro and I encouraged her to start it for her anxiety which may be the cause of her symptoms or at least exacerbating them.She still has left-sided numbness which makes her even more anxious. 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 She denies headaches. Routine EEG was normal. She had a normal stroke workup in 2015 for facial droop.   Today she says if she gets sick or anxious she has left-sided tingling. She has had episodes of feeling sick to her stomach and dizziness, left back of her head numb and numb in the should and calf or numb in  the left hand and foot. Also numbness on the left side of her face. No other issues. No weakness. No focal deficits. No headache. Lightheaded during the episodes. Not breathing hard but she did start stressing. She drank some caffeine which resolved the symptoms, she was told she wass going through caffeine withdrawal. It still makes her significantly anxious.Xanax will also resolve the tingling.  Interval history 08/08/2016: She saw Dr. Ernesto Rutherford and she was treated, she was treated for sinus disease and the abnormal smells resolved. Abnormal smells resolved. No further episodes. Feeling well. No issues. Discussed MRI of the brain, she should have repeat MRI every few years to follow pituitary unless she has symptoms such as vision changes and headaches or anything concerning then needs repeat sooner. F/u one year.  Mary L Jonesis a 53 y.o.femalehere as a referral from Dr. Marlyne Beards abnormal smells new onset recently and left-sided paresthesias for 6 years. Past medical history anxiety, HLD. She recently has episode of smelling gas/diesel that is not there. She is under a lot of stress due to her Aunt's AML recently placed into hospice. During an episode of anxiety she smelled diesel and gas, brief, had multiple episodes over 4-6 days. Her aunt is dying, she is having a hard time with it. She has tingling left side of the body usually worse when she is nervous/anxious. She had the abnormal smells off an or for 4 days, would last briefly. Her whole left side is tingly and heavy. Can last briefly, worse with stress. No weakness. No aphasia or dysarthria. She has difficulty concentrating, worse with stress. She has difficulty sleeping. She has insomnia. Sleeping is poor. No head trauma or other inciting events. She is feeling very fatigued, tired, decreased concentration. She is scared about her aunt dying. She is afraid she is having TIAs as well. She smoked for 10 years, she is trying to  quit. She has 3 sisters, she handles everything for her Aunt who is like her mother. No headaches, no new vision changes, no other focal neurologic deficits. Alprazolam helps with her symptoms. She was hospitalized 6 years ago and evaluation for stroke was negative. 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 She had some mild dysfunction. 3 days previously she began to intermittently smelling odor as a mix between chemical and diesel fuel. In the smell last for a maximum of 3 minutes at a time and abruptly disappears. She also has anxiety with tingling in her limbs. But the symptoms are not new for her. She denied any new fevers chills, headache, neurologic deficits, difficulty breathing, nausea vomiting or other complaints.Exam was normal including general exam and neurologic exam. Since there are no indications of neurologic abnormalities or functional deficits aresigns of seizures they diagnosed her with possible anxiety and referral to neurology. No imaging was completed.  Patient was also seen in  2015 and admitted for right facial droop. Imaging showed no stroke. No signs of MS or other etiologies and MRI. No stenosis or significant issues on vascular imaging. No resultant neuro deficits. Was not felt to be a stroke.  CMP normal.  LDL 85.   FINDINGS: MRI HEAD 2015 (personally reviewed images and agree with the following)  No evidence for acute infarction, hemorrhage, mass lesion, hydrocephalus, or extra-axial fluid. No atrophy or white matter disease. Flow voids are maintained throughout the carotid, basilar, and vertebral arteries. There are no areas of chronic hemorrhage. Pituitary, pineal, and cerebellar tonsils unremarkable. No upper cervical lesions.  Negative orbits, and osseous structures. Chronic bilateral  maxillary sinus disease. Chronic bilateral ethmoid sinus disease. 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 Trace left mastoid effusion.  Good general agreement prior CT.  MRA HEAD FINDINGS  Internal carotid arteries widely patent. Basilar artery widely patent with left greater than right vertebrals contributing. Fetal origin left PCA. 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 No intracranial stenosis or dissection. No visible intracranial aneurysm.   Observations/Objective:  Generalized: Well developed, in no acute distress  Mentation: Alert oriented to time, place, history taking. Follows all commands speech and language fluent   Assessment and Plan:  53 y.o. year old female  has a past medical history of Anxiety, Breast cancer ([place]) (1999), Headache, History of exercise stress test, Hyperlipidemia, and Numbness. here with    ICD-10-CM   1. Chronic nonintractable headache, unspecified headache type  R51    She is doing very well on escitalopram 10 mg daily.  From a headache perspective medication is doing very well.  I am concerned that she continues to struggle with anxiety.  I have asked that she follow-up with her primary care to discuss options for anxiety management.  I have advised against regular use of Xanax.  She may do well with an increased dose of escitalopram.  She may need to consider different therapy or referral to psychiatry.  She was advised to follow-up with [place] in 1 year, sooner if needed.  She verbalizes understanding and agreement with this plan.  No orders of the defined types were placed in this encounter.   No orders of the defined types were placed in this encounter.    Follow Up Instructions:  I discussed the assessment and treatment plan with the patient. The patient was provided an opportunity to ask questions and all were  answered. The patient agreed with the plan and demonstrated an understanding of the instructions.   The patient was advised to call back or seek an in-person evaluation if the symptoms worsen or if the condition fails to improve as anticipated.  I provided 25 minutes of non-face-to-face time during this encounter.  Patient is located at her place of employment during teleconference.  Provider is located at her place of residence.  Maryelizabeth Kaufmann, CMA helped to facilitate visit.   Debbora Presto, NP

## 2019-03-24 NOTE — Progress Notes (Signed)
Made any corrections needed, and agree with history, physical, neuro exam,assessment and plan as stated.     Antonia Ahern, MD [facility]  

## 2019-03-26 ENCOUNTER — Other Ambulatory Visit: Payer: Self-pay

## 2019-04-02 ENCOUNTER — Ambulatory Visit (INDEPENDENT_AMBULATORY_CARE_PROVIDER_SITE_OTHER): Payer: 59 | Admitting: Family Medicine

## 2019-04-02 ENCOUNTER — Other Ambulatory Visit: Payer: Self-pay

## 2019-04-02 ENCOUNTER — Encounter: Payer: Self-pay | Admitting: Family Medicine

## 2019-04-02 DIAGNOSIS — I10 Essential (primary) hypertension: Secondary | ICD-10-CM

## 2019-04-02 DIAGNOSIS — N760 Acute vaginitis: Secondary | ICD-10-CM | POA: Diagnosis not present

## 2019-04-02 DIAGNOSIS — E785 Hyperlipidemia, unspecified: Secondary | ICD-10-CM

## 2019-04-02 MED ORDER — FLUCONAZOLE 150 MG PO TABS: ORAL_TABLET | ORAL | 0 refills | Status: DC

## 2019-04-02 NOTE — Progress Notes (Signed)
Virtual Visit via Video Note  I connected with [name] on 04/02/19 at  9:45 AM EDT by a video enabled telemedicine application and verified that I am speaking with the correct person using two identifiers.  Location: Patient: home with her sister  Provider: home    I discussed the limitations of evaluation and management by telemedicine and the availability of in person appointments. The patient expressed understanding and agreed to proceed.  History of Present Illness: Pt is home and   Observations/Objective: 118/76  82  Afebrile  Pt is in NAD  Assessment and Plan: 1. Acute vaginitis Will try diflucan and she will come into office if symptoms do not resolve - fluconazole (DIFLUCAN) 150 MG tablet; 1 po x1, may repeat in 3 days prn  Dispense: 2 tablet; Refill: 0  2. Essential hypertension Well controlled, no changes to meds. Encouraged heart healthy diet such as the DASH diet and exercise as tolerated.   - Lipid panel; Future - Comprehensive metabolic panel; Future  3. Hyperlipidemia, unspecified hyperlipidemia type Tolerating statin, encouraged heart healthy diet, avoid trans fats, minimize simple carbs and saturated fats. Increase exercise as tolerated - Lipid panel; Future - Comprehensive metabolic panel; Future   Follow Up Instructions:    I discussed the assessment and treatment plan with the patient. The patient was provided an opportunity to ask questions and all were answered. The patient agreed with the plan and demonstrated an understanding of the instructions.   The patient was advised to call back or seek an in-person evaluation if the symptoms worsen or if the condition fails to improve as anticipated.  I provided 25 minutes of non-face-to-face time during this encounter.   [name], DO

## 2019-04-07 ENCOUNTER — Other Ambulatory Visit: Payer: Self-pay

## 2019-04-07 ENCOUNTER — Other Ambulatory Visit (INDEPENDENT_AMBULATORY_CARE_PROVIDER_SITE_OTHER): Payer: 59

## 2019-04-07 DIAGNOSIS — E785 Hyperlipidemia, unspecified: Secondary | ICD-10-CM

## 2019-04-07 DIAGNOSIS — I10 Essential (primary) hypertension: Secondary | ICD-10-CM

## 2019-04-08 LAB — COMPREHENSIVE METABOLIC PANEL
ALT: 9 U/L (ref 0–35)
AST: 12 U/L (ref 0–37)
Albumin: 4.1 g/dL (ref 3.5–5.2)
Alkaline Phosphatase: 111 U/L (ref 39–117)
BUN: 10 mg/dL (ref 6–23)
CO2: 28 mEq/L (ref 19–32)
Calcium: 9.2 mg/dL (ref 8.4–10.5)
Chloride: 105 mEq/L (ref 96–112)
Creatinine, Ser: 0.99 mg/dL (ref 0.40–1.20)
GFR: 71.06 mL/min (ref >60.00)
Glucose, Bld: 86 mg/dL (ref 70–99)
Potassium: 3.9 mEq/L (ref 3.5–5.1)
Sodium: 140 mEq/L (ref 135–145)
Total Bilirubin: 0.4 mg/dL (ref 0.2–1.2)
Total Protein: 7 g/dL (ref 6.0–8.3)

## 2019-04-08 LAB — LIPID PANEL
Cholesterol: 121 mg/dL (ref 0–200)
HDL: 27.3 mg/dL — ABNORMAL LOW (ref >39.00)
LDL Cholesterol: 66 mg/dL (ref 0–99)
NonHDL: 93.72
Total CHOL/HDL Ratio: 4
Triglycerides: 138 mg/dL (ref 0.0–149.0)
VLDL: 27.6 mg/dL (ref 0.0–40.0)

## 2019-05-05 ENCOUNTER — Other Ambulatory Visit: Payer: Self-pay | Admitting: Family Medicine

## 2019-05-06 ENCOUNTER — Telehealth: Payer: Self-pay | Admitting: Family Medicine

## 2019-05-06 ENCOUNTER — Other Ambulatory Visit: Payer: Self-pay

## 2019-05-06 MED ORDER — METOPROLOL SUCCINATE ER 50 MG PO TB24: 50.0000 mg | ORAL_TABLET | Freq: Every day | ORAL | 1 refills | Status: DC

## 2019-05-06 MED ORDER — PHENYLEPHRINE HCL-NACL 10-0.9 MG/250ML-% IV SOLN
INTRAVENOUS | Status: AC
  Filled 2019-05-06: qty 250

## 2019-05-06 NOTE — Telephone Encounter (Signed)
Medication Refill - Medication: metoprolol succinate (TOPROL XL) 50 MG 24 hr tablet [[id number]]    Has the patient contacted their pharmacy? Yes.   (Agent: If no, request that the patient contact the pharmacy for the refill.) (Agent: If yes, when and what did the pharmacy advise?)  Preferred Pharmacy (with phone number or street name):  [facility] [place], [address]  [place] [address]  Phone: [phone] Fax: [phone]   Agent: Please be advised that RX refills may take up to 3 business days. We ask that you follow-up with your pharmacy.

## 2019-05-06 NOTE — Telephone Encounter (Signed)
Refill sent.

## 2019-05-08 ENCOUNTER — Other Ambulatory Visit: Payer: Self-pay

## 2019-05-08 ENCOUNTER — Encounter: Payer: Self-pay | Admitting: Family Medicine

## 2019-05-08 ENCOUNTER — Ambulatory Visit (INDEPENDENT_AMBULATORY_CARE_PROVIDER_SITE_OTHER): Payer: 59 | Admitting: Family Medicine

## 2019-05-08 VITALS — BP 137/89 | HR 83 | Temp 98.1°F | Resp 18 | Ht 66.0 in | Wt 144.8 lb

## 2019-05-08 DIAGNOSIS — E559 Vitamin D deficiency, unspecified: Secondary | ICD-10-CM | POA: Diagnosis not present

## 2019-05-08 DIAGNOSIS — I10 Essential (primary) hypertension: Secondary | ICD-10-CM

## 2019-05-08 DIAGNOSIS — Z0000 Encounter for general adult medical examination without abnormal findings: Secondary | ICD-10-CM

## 2019-05-08 LAB — CBC WITH DIFFERENTIAL/PLATELET
Basophils Absolute: 0.1 10*3/uL (ref 0.0–0.1)
Basophils Relative: 0.9 % (ref 0.0–3.0)
Eosinophils Absolute: 0.4 10*3/uL (ref 0.0–0.7)
Eosinophils Relative: 7.4 % — ABNORMAL HIGH (ref 0.0–5.0)
HCT: 43.1 % (ref 36.0–46.0)
Hemoglobin: 14.4 g/dL (ref 12.0–15.0)
Lymphocytes Relative: 32.6 % (ref 12.0–46.0)
Lymphs Abs: 1.9 10*3/uL (ref 0.7–4.0)
MCHC: 33.5 g/dL (ref 30.0–36.0)
MCV: 91.5 fl (ref 78.0–100.0)
Monocytes Absolute: 0.5 10*3/uL (ref 0.1–1.0)
Monocytes Relative: 8.8 % (ref 3.0–12.0)
Neutro Abs: 3 10*3/uL (ref 1.4–7.7)
Neutrophils Relative %: 50.3 % (ref 43.0–77.0)
Platelets: 246 10*3/uL (ref 150.0–400.0)
RBC: 4.71 Mil/uL (ref 3.87–5.11)
RDW: 13.6 % (ref 11.5–15.5)
WBC: 5.9 10*3/uL (ref 4.0–10.5)

## 2019-05-08 LAB — COMPREHENSIVE METABOLIC PANEL
ALT: 9 U/L (ref 0–35)
AST: 12 U/L (ref 0–37)
Albumin: 4.3 g/dL (ref 3.5–5.2)
Alkaline Phosphatase: 112 U/L (ref 39–117)
BUN: 9 mg/dL (ref 6–23)
CO2: 30 mEq/L (ref 19–32)
Calcium: 9.9 mg/dL (ref 8.4–10.5)
Chloride: 106 mEq/L (ref 96–112)
Creatinine, Ser: 0.93 mg/dL (ref 0.40–1.20)
GFR: 76.35 mL/min (ref >60.00)
Glucose, Bld: 80 mg/dL (ref 70–99)
Potassium: 4.9 mEq/L (ref 3.5–5.1)
Sodium: 142 mEq/L (ref 135–145)
Total Bilirubin: 0.4 mg/dL (ref 0.2–1.2)
Total Protein: 7.1 g/dL (ref 6.0–8.3)

## 2019-05-08 LAB — LIPID PANEL
Cholesterol: 138 mg/dL (ref 0–200)
HDL: 30 mg/dL — ABNORMAL LOW (ref >39.00)
LDL Cholesterol: 76 mg/dL (ref 0–99)
NonHDL: 107.97
Total CHOL/HDL Ratio: 5
Triglycerides: 162 mg/dL — ABNORMAL HIGH (ref 0.0–149.0)
VLDL: 32.4 mg/dL (ref 0.0–40.0)

## 2019-05-08 LAB — VITAMIN D 25 HYDROXY (VIT D DEFICIENCY, FRACTURES): VITD: 22.77 ng/mL — ABNORMAL LOW (ref 30.00–100.00)

## 2019-05-08 LAB — TSH: TSH: 0.57 u[IU]/mL (ref 0.35–4.50)

## 2019-05-08 MED ORDER — VITAMIN D (ERGOCALCIFEROL) 1.25 MG (50000 UNIT) PO CAPS: 50000.0000 [IU] | ORAL_CAPSULE | ORAL | 0 refills | Status: DC

## 2019-05-08 NOTE — Patient Instructions (Signed)

## 2019-05-08 NOTE — Progress Notes (Signed)
Subjective:     [name] is a 53 y.o. female and is here for a comprehensive physical exam. The patient reports no problems.  Social History   Socioeconomic History  . Marital status: Single    Spouse name: Not on file  . Number of children: 1  . Years of education: 49  . Highest education level: Not on file  Occupational History  . Occupation: works for [employer]; office specialist  Social Needs  . Financial resource strain: Not on file  . Food insecurity    Worry: Not on file    Inability: Not on file  . Transportation needs    Medical: Not on file    Non-medical: Not on file  Tobacco Use  . Smoking status: Current Some Day Smoker    Packs/day: 0.25    Years: 27.00    Pack years: 6.75    Types: Cigarettes  . Smokeless tobacco: Never Used  . Tobacco comment: Trying to quit  Substance and Sexual Activity  . Alcohol use: Yes    Alcohol/week: 0.0 standard drinks    Comment: Rare  . Drug use: No  . Sexual activity: Not Currently  Lifestyle  . Physical activity    Days per week: Not on file    Minutes per session: Not on file  . Stress: Not on file  Relationships  . Social Herbalist on phone: Not on file    Gets together: Not on file    Attends religious service: Not on file    Active member of club or organization: Not on file    Attends meetings of clubs or organizations: Not on file    Relationship status: Not on file  . Intimate partner violence    Fear of current or ex partner: Not on file    Emotionally abused: Not on file    Physically abused: Not on file    Forced sexual activity: Not on file  Other Topics Concern  . Not on file  Social History Narrative   Marital status: single      Children: one daughter      Lives: with sister      Employment: works for [employer]; office specialist      Education: Multimedia programmer      Tobacco: trying to quit      Alcohol: none; rare      Drugs: none   Exercise: walking daily 2 miles      Caffeine use: Tea/pepsi- 1 pepsi/morning      Tea- 3 cups per day   Right-handed   Health Maintenance  Topic Date Due  . COLONOSCOPY  06/25/2016  . PAP SMEAR-Modifier  07/18/2016  . INFLUENZA VACCINE  07/04/2024 (Originally 05/10/2019)  . TETANUS/TDAP  10/10/2019  . HIV Screening  Completed    The following portions of the patient's history were reviewed and updated as appropriate:  She  has a past medical history of Anxiety, Breast cancer ([place]) (1999), Headache, History of exercise stress test, Hyperlipidemia, and Numbness. She does not have any pertinent problems on file. She  has a past surgical history that includes Abdominal hysterectomy (2006); Mastectomy; Corneal transplant (Bilateral); Eye surgery; and Breast surgery. Her family history includes Aortic aneurysm in her mother and another family member; Hyperlipidemia in her sister; Hypertension in an other family member; Stroke (age of onset: 78) in her sister. She  reports that she has been smoking cigarettes. She has  a 6.75 pack-year smoking history. She has never used smokeless tobacco. She reports current alcohol use. She reports that she does not use drugs. She has a current medication list which includes the following prescription(s): acyclovir, alprazolam, aspirin, atorvastatin, escitalopram, metoprolol succinate, and vitamin d (ergocalciferol). Current Outpatient Medications on File Prior to Visit  Medication Sig Dispense Refill  . acyclovir (ZOVIRAX) 400 MG tablet TAKE 1 TABLET(400 MG) BY MOUTH TWICE DAILY 180 tablet 1  . ALPRAZolam (XANAX) 0.25 MG tablet Take 1 tablet (0.25 mg total) by mouth 2 (two) times daily as needed. for anxiety 15 tablet 0  . aspirin 81 MG tablet Take 1 tablet (81 mg total) by mouth daily. 30 tablet   . atorvastatin (LIPITOR) 10 MG tablet TAKE 1 TABLET BY MOUTH DAILY AT 6:00PM 90 tablet 1  . escitalopram (LEXAPRO) 10 MG tablet Take 1 tablet (10 mg total) by mouth  daily. 90 tablet 3  . metoprolol succinate (TOPROL XL) 50 MG 24 hr tablet Take 1 tablet (50 mg total) by mouth daily. Take with or immediately following a meal. 90 tablet 1   No current facility-administered medications on file prior to visit.    She is allergic to penicillins..  Review of Systems Review of Systems  Constitutional: Negative for activity change, appetite change and fatigue.  HENT: Negative for hearing loss, congestion, tinnitus and ear discharge.  dentist q10m Eyes: Negative for visual disturbance (see optho q1y -- vision corrected to 20/20 with glasses).  Respiratory: Negative for cough, chest tightness and shortness of breath.   Cardiovascular: Negative for chest pain, palpitations and leg swelling.  Gastrointestinal: Negative for abdominal pain, diarrhea, constipation and abdominal distention.  Genitourinary: Negative for urgency, frequency, decreased urine volume and difficulty urinating.  Musculoskeletal: Negative for back pain, arthralgias and gait problem.  Skin: Negative for color change, pallor and rash.  Neurological: Negative for dizziness, light-headedness, numbness and headaches.  Hematological: Negative for adenopathy. Does not bruise/bleed easily.  Psychiatric/Behavioral: Negative for suicidal ideas, confusion, sleep disturbance, self-injury, dysphoric mood, decreased concentration and agitation.       Objective:    BP 137/89 (BP Location: Right Arm, Patient Position: Sitting, Cuff Size: Normal)   Pulse 83   Temp 98.1 F (36.7 C) (Oral)   Resp 18   Ht 5\' 6"  (1.676 m)   Wt 144 lb 12.8 oz (65.7 kg)   SpO2 99%   BMI 23.37 kg/m  General appearance: alert, cooperative, appears stated age and no distress Head: Normocephalic, without obvious abnormality, atraumatic Eyes: conjunctivae/corneas clear. PERRL, EOM's intact. Fundi benign. Ears: normal TM's and external ear canals both ears Nose: Nares normal. Septum midline. Mucosa normal. No drainage or  sinus tenderness. Throat: lips, mucosa, and tongue normal; teeth and gums normal Neck: no adenopathy, no carotid bruit, no JVD, supple, symmetrical, trachea midline and thyroid not enlarged, symmetric, no tenderness/mass/nodules Back: symmetric, no curvature. ROM normal. No CVA tenderness. Lungs: clear to auscultation bilaterally Breasts: normal appearance, no masses or tenderness Heart: regular rate and rhythm, S1, S2 normal, no murmur, click, rub or gallop Abdomen: soft, non-tender; bowel sounds normal; no masses,  no organomegaly Pelvic: not indicated; status post hysterectomy, negative ROS Extremities: extremities normal, atraumatic, no cyanosis or edema Pulses: 2+ and symmetric Skin: Skin color, texture, turgor normal. No rashes or lesions Lymph nodes: Cervical, supraclavicular, and axillary nodes normal. Neurologic: Alert and oriented X 3, normal strength and tone. Normal symmetric reflexes. Normal coordination and gait    Assessment:    Healthy  female exam.      Plan:    ghm utd Check labs See After Visit Summary for Counseling Recommendations    1. Vitamin D deficiency Check labs  - Vitamin D, Ergocalciferol, (DRISDOL) 1.25 MG (50000 UT) CAPS capsule; Take 1 capsule (50,000 Units total) by mouth every 7 (seven) days.  Dispense: 12 capsule; Refill: 0 - Vitamin D (25 hydroxy)  2. Essential hypertension Well controlled, no changes to meds. Encouraged heart healthy diet such as the DASH diet and exercise as tolerated.  - Lipid panel - CBC with Differential/Platelet - Comprehensive metabolic panel - TSH - Vitamin D (25 hydroxy)  3. Preventative health care See above - Lipid panel - CBC with Differential/Platelet - Comprehensive metabolic panel - TSH - Vitamin D (25 hydroxy)

## 2019-05-11 ENCOUNTER — Other Ambulatory Visit: Payer: Self-pay | Admitting: Family Medicine

## 2019-05-11 DIAGNOSIS — E559 Vitamin D deficiency, unspecified: Secondary | ICD-10-CM

## 2019-06-11 ENCOUNTER — Other Ambulatory Visit: Payer: Self-pay

## 2019-06-11 ENCOUNTER — Ambulatory Visit (INDEPENDENT_AMBULATORY_CARE_PROVIDER_SITE_OTHER): Payer: 59

## 2019-06-11 ENCOUNTER — Encounter: Payer: Self-pay | Admitting: Family Medicine

## 2019-06-11 DIAGNOSIS — Z23 Encounter for immunization: Secondary | ICD-10-CM

## 2019-06-11 NOTE — Progress Notes (Signed)
Patient came in today to have her flu vaccine per Dr. Etter Sjogren. She tolerated the injection with no complications.

## 2019-08-08 ENCOUNTER — Other Ambulatory Visit: Payer: Self-pay | Admitting: Family Medicine

## 2019-08-08 ENCOUNTER — Telehealth: Payer: Self-pay

## 2019-08-08 DIAGNOSIS — Z23 Encounter for immunization: Secondary | ICD-10-CM

## 2019-08-08 MED ORDER — VIVOTIF PO CPDR: 1.0000 | DELAYED_RELEASE_CAPSULE | ORAL | 0 refills | Status: DC

## 2019-08-08 NOTE — Telephone Encounter (Signed)
vivotif sent to pharmacy---  Elizebeth Koller >1 week prior to leaving

## 2019-08-08 NOTE — Telephone Encounter (Signed)
VM left with instructions.

## 2019-08-08 NOTE — Telephone Encounter (Signed)
Copied from [place] [id number]. Topic: General - Inquiry >> Aug 06, 2019  4:13 PM Selinda Flavin B, NT wrote: Reason for CRM: Patient calling and state that she just found out that she will be going out of the [place] and is needing a prescription for Typhoid pills. States that she is going out of the country 08/15/2019. Please advise. [facility] [id number], [place] >> Aug 07, 2019  1:31 PM Mathis Bud wrote: Patient is checking status on this medication.  Patient is leaving on the 6th and states she needs to be taking medication asap. Call back [phone]

## 2019-08-12 ENCOUNTER — Encounter: Payer: Self-pay | Admitting: *Deleted

## 2019-09-09 ENCOUNTER — Other Ambulatory Visit: Payer: Self-pay

## 2019-09-09 MED ORDER — ATORVASTATIN CALCIUM 10 MG PO TABS: ORAL_TABLET | ORAL | 1 refills | Status: DC

## 2019-09-09 NOTE — Progress Notes (Unsigned)
Pt is due for follow up

## 2019-10-29 ENCOUNTER — Other Ambulatory Visit: Payer: Self-pay | Admitting: Family Medicine

## 2019-11-03 ENCOUNTER — Other Ambulatory Visit: Payer: Self-pay | Admitting: *Deleted

## 2019-11-03 MED ORDER — METOPROLOL SUCCINATE ER 50 MG PO TB24: 50.0000 mg | ORAL_TABLET | Freq: Every day | ORAL | 1 refills | Status: DC

## 2019-11-07 ENCOUNTER — Other Ambulatory Visit: Payer: Self-pay | Admitting: Family Medicine

## 2019-11-07 DIAGNOSIS — E559 Vitamin D deficiency, unspecified: Secondary | ICD-10-CM

## 2019-11-11 ENCOUNTER — Other Ambulatory Visit: Payer: Self-pay

## 2019-11-11 ENCOUNTER — Encounter: Payer: Self-pay | Admitting: Family Medicine

## 2019-11-11 ENCOUNTER — Ambulatory Visit (INDEPENDENT_AMBULATORY_CARE_PROVIDER_SITE_OTHER): Payer: 59 | Admitting: Family Medicine

## 2019-11-11 VITALS — BP 133/96 | HR 87 | Temp 98.6°F

## 2019-11-11 DIAGNOSIS — I10 Essential (primary) hypertension: Secondary | ICD-10-CM | POA: Diagnosis not present

## 2019-11-11 DIAGNOSIS — E785 Hyperlipidemia, unspecified: Secondary | ICD-10-CM

## 2019-11-11 DIAGNOSIS — E559 Vitamin D deficiency, unspecified: Secondary | ICD-10-CM | POA: Diagnosis not present

## 2019-11-11 LAB — LIPID PANEL
Cholesterol: 137 mg/dL (ref 0–200)
HDL: 27.6 mg/dL — ABNORMAL LOW (ref >39.00)
LDL Cholesterol: 83 mg/dL (ref 0–99)
NonHDL: 109.81
Total CHOL/HDL Ratio: 5
Triglycerides: 132 mg/dL (ref 0.0–149.0)
VLDL: 26.4 mg/dL (ref 0.0–40.0)

## 2019-11-11 LAB — COMPREHENSIVE METABOLIC PANEL
ALT: 9 U/L (ref 0–35)
AST: 12 U/L (ref 0–37)
Albumin: 4.1 g/dL (ref 3.5–5.2)
Alkaline Phosphatase: 122 U/L — ABNORMAL HIGH (ref 39–117)
BUN: 12 mg/dL (ref 6–23)
CO2: 29 mEq/L (ref 19–32)
Calcium: 9.6 mg/dL (ref 8.4–10.5)
Chloride: 106 mEq/L (ref 96–112)
Creatinine, Ser: 0.98 mg/dL (ref 0.40–1.20)
GFR: 71.73 mL/min (ref >60.00)
Glucose, Bld: 83 mg/dL (ref 70–99)
Potassium: 4.8 mEq/L (ref 3.5–5.1)
Sodium: 142 mEq/L (ref 135–145)
Total Bilirubin: 0.4 mg/dL (ref 0.2–1.2)
Total Protein: 7.1 g/dL (ref 6.0–8.3)

## 2019-11-11 LAB — VITAMIN D 25 HYDROXY (VIT D DEFICIENCY, FRACTURES): VITD: 24.77 ng/mL — ABNORMAL LOW (ref 30.00–100.00)

## 2019-11-11 NOTE — Progress Notes (Signed)
Virtual Visit via Video Note  I connected with [name] on 11/11/19 at 10:00 AM EST by a video enabled telemedicine application and verified that I am speaking with the correct person using two identifiers.  Location: Patient: home  Provider: office    I discussed the limitations of evaluation and management by telemedicine and the availability of in person appointments. The patient expressed understanding and agreed to proceed.  History of Present Illness: Pt is home alone with no complaints.  Her daughter tested + for covid ===she does not live with her but pt is helping take care of her------ her test was negative Pt needs bp check   Observations/Objective: Vitals:   11/11/19 1009  BP: (!) 133/96  Pulse: 87  Temp: 98.6 F (37 C)   Pt is in NAD   Assessment and Plan: 1. Vitamin D deficiency Check labs  - VITAMIN D 25 Hydroxy (Vit-D Deficiency, Fractures); Future  2. Hyperlipidemia, unspecified hyperlipidemia type Encouraged heart healthy diet, increase exercise, avoid trans fats, consider a krill oil cap daily - Lipid panel; Future - Comprehensive metabolic panel; Future  3. Essential hypertension Poorly controlled will alter medications-- if needed , encouraged DASH diet, minimize caffeine and obtain adequate sleep. Report concerning symptoms and follow up as directed and as needed Pt is coming in for labs today==will check bp at that time Follow Up Instructions:    I discussed the assessment and treatment plan with the patient. The patient was provided an opportunity to ask questions and all were answered. The patient agreed with the plan and demonstrated an understanding of the instructions.   The patient was advised to call back or seek an in-person evaluation if the symptoms worsen or if the condition fails to improve as anticipated.  I provided 20 minutes of non-face-to-face time during this encounter.   [name], DO

## 2019-11-11 NOTE — Addendum Note (Signed)
Addended by: Caffie Pinto on: 11/11/2019 10:54 AM   Modules accepted: Orders

## 2019-11-12 ENCOUNTER — Other Ambulatory Visit: Payer: Self-pay | Admitting: Family Medicine

## 2019-11-12 DIAGNOSIS — E559 Vitamin D deficiency, unspecified: Secondary | ICD-10-CM

## 2019-11-12 DIAGNOSIS — E785 Hyperlipidemia, unspecified: Secondary | ICD-10-CM

## 2019-11-13 ENCOUNTER — Other Ambulatory Visit: Payer: Self-pay

## 2019-12-29 ENCOUNTER — Telehealth: Payer: Self-pay | Admitting: Family Medicine

## 2019-12-29 NOTE — Telephone Encounter (Signed)
Pt still having ankle issues, has appt with Tamala Julian in April. She stated that last year we referred her to a specialist at [facility] in [place]. According to the patient, specialist did not know what was wrong, but her in a brace and discussed possible exploratory surgery.  Pt is wondering if Dr. Tamala Julian has reviewed the records from her visit at [facility] and if he has any thoughts on how to proceed as the pain continues, especially when the weather gets warmer.

## 2019-12-29 NOTE — Telephone Encounter (Signed)
Can see [place] documents. Responded to patient in [place].

## 2019-12-31 ENCOUNTER — Encounter: Payer: Self-pay | Admitting: Family Medicine

## 2019-12-31 ENCOUNTER — Other Ambulatory Visit: Payer: Self-pay

## 2019-12-31 ENCOUNTER — Ambulatory Visit: Payer: 59 | Admitting: Family Medicine

## 2019-12-31 VITALS — BP 118/84 | HR 91 | Ht 66.0 in | Wt 147.0 lb

## 2019-12-31 DIAGNOSIS — M958 Other specified acquired deformities of musculoskeletal system: Secondary | ICD-10-CM

## 2019-12-31 MED ORDER — MELOXICAM 15 MG PO TABS: 15.0000 mg | ORAL_TABLET | Freq: Every day | ORAL | 0 refills | Status: DC

## 2019-12-31 NOTE — Patient Instructions (Signed)
Dr. Scheryl Darter Cam walker Meloxicam 10 days then as needed Any questions don't hesitate to call

## 2019-12-31 NOTE — Progress Notes (Signed)
[name] Phone: [phone] Subjective:   [name], am serving as a scribe for Dr. Hulan Saas. This visit occurred during the SARS-CoV-2 public health emergency.  Safety protocols were in place, including screening questions prior to the visit, additional usage of staff PPE, and extensive cleaning of exam room while observing appropriate contact time as indicated for disinfecting solutions.   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  Only 6-minute millimeters in size 2 years ago.  Patient has had a history of breast cancer, laboratory work-up also ordered.  I believe though that patient would likely need surgical intervention with the instability.  Once weekly vitamin D patient will come back in 4 weeks  Update 12/31/2019 [name] is a 54 y.o. female coming in with complaint of right ankle pain. Patient has been wearing aircast since 2019. 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 Pain in ankle mortise. Patient states that she has had pain for 25 years. Has been using ice or Tylenol. Patient ambulates by walking with right foot plantarflexed.  Patient continues to have such significant instability and scared to fall.  Patient has tried everything including custom bracing, exercises, formal physical therapy and feels like from time to time gets minorly better and then was significantly worsening.  [place] Xray-Right foot 07/26/2018 CONCLUSION:  1. No acute fracture or malalignment.  2. Mild midfoot and first MTP joint degenerative changes. 3. Punctate metallic object projects over the soft tissues lateral and proximal to the fifth MTP joint, indeterminate whether this is a retained foreign body or on the skin surface. 4. 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     MRI IMPRESSION:08/2018 1. 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 The degree of surrounding marrow edema has slightly decreased. 2. Unchanged small non-fragmented osteochondral lesion of the lateral talar dome.  Past Medical History:  Diagnosis Date  . Anxiety   . Breast cancer [facility]) 1999   2004  . Headache   . History of exercise stress test    GXT 9/19:  Exercised 5'48"; METs: 7; Normal BP response; No ischemic EKG changes  . Hyperlipidemia   . Numbness    Past Surgical History:  Procedure Laterality Date  . ABDOMINAL HYSTERECTOMY  2006   TAH,BSO secondary bleeding and breast cancer--  . BREAST SURGERY    . CORNEAL TRANSPLANT Bilateral    6x, started in 1999, last was 2006.  Marland Kitchen EYE SURGERY    . MASTECTOMY     bilateral---1999/2004   Social History   Socioeconomic History  . Marital status: Single    Spouse name: Not on file  . Number of children: 1  . Years of education: 82  . Highest education level: Not on file  Occupational History  . Occupation: works for [employer]; office specialist  Tobacco Use  . Smoking status: Current Some Day Smoker    Packs/day: 0.25    Years: 27.00    Pack years: 6.75    Types: Cigarettes  . Smokeless tobacco: Never Used  . Tobacco comment: Trying to quit  Substance and Sexual Activity  . Alcohol use: Yes  Alcohol/week: 0.0 standard drinks    Comment: Rare  . Drug use: No  . Sexual activity: Not Currently  Other Topics Concern  . Not on file  Social History Narrative   Marital status: single      Children: one daughter      Lives: with sister      Employment: works for [employer]; office specialist      Education: Multimedia programmer      Tobacco: trying to quit      Alcohol: none; rare      Drugs: none      Exercise: walking daily 2 miles      Caffeine use:  Tea/pepsi- 1 pepsi/morning      Tea- 3 cups per day   Right-handed   Social Determinants of Health   Financial Resource Strain:   . Difficulty of Paying Living Expenses:   Food Insecurity:   . Worried About Charity fundraiser in the Last Year:   . Arboriculturist in the Last Year:   Transportation Needs:   . Film/video editor (Medical):   Marland Kitchen Lack of Transportation (Non-Medical):   Physical Activity:   . Days of Exercise per Week:   . Minutes of Exercise per Session:   Stress:   . Feeling of Stress :   Social Connections:   . Frequency of Communication with Friends and Family:   . Frequency of Social Gatherings with Friends and Family:   . Attends Religious Services:   . Active Member of Clubs or Organizations:   . Attends Archivist Meetings:   Marland Kitchen Marital Status:    Allergies  Allergen Reactions  . Penicillins Other (See Comments)    Childhood reaction   Family History  Problem Relation Age of Onset  . Stroke Sister 69       after starting OCP  . Hyperlipidemia Sister   . Aortic aneurysm Mother   . Aortic aneurysm Other   . Hypertension Other   . Seizures Neg Hx   . Migraines Neg Hx      Current Outpatient Medications (Cardiovascular):  .  atorvastatin (LIPITOR) 10 MG tablet, TAKE 1 TABLET BY MOUTH DAILY AT 6:00PM .  metoprolol succinate (TOPROL XL) 50 MG 24 hr tablet, Take 1 tablet (50 mg total) by mouth daily. Take with or immediately following a meal.   Current Outpatient Medications (Analgesics):  .  aspirin 81 MG tablet, Take 1 tablet (81 mg total) by mouth daily. .  meloxicam (MOBIC) 15 MG tablet, Take 1 tablet (15 mg total) by mouth daily.   Current Outpatient Medications (Other):  .  acyclovir (ZOVIRAX) 400 MG tablet, TAKE 1 TABLET(400 MG) BY MOUTH TWICE DAILY .  ALPRAZolam (XANAX) 0.25 MG tablet, Take 1 tablet (0.25 mg total) by mouth 2 (two) times daily as needed. for anxiety .  escitalopram (LEXAPRO) 10 MG tablet, Take 1 tablet (10 mg  total) by mouth daily. .  typhoid (VIVOTIF) DR capsule, Take 1 capsule by mouth every other day. .  Vitamin D, Ergocalciferol, (DRISDOL) 1.25 MG (50000 UNIT) CAPS capsule, TAKE 1 CAPSULE BY MOUTH EVERY 7 DAYS   Reviewed prior external information including notes and imaging from  primary care provider As well as notes that were available from care everywhere and other healthcare systems.  Past medical history, social, surgical and family history all reviewed in electronic medical record.  No pertanent information unless stated regarding to the chief complaint.  Review of Systems:  No headache, visual changes, nausea, vomiting, diarrhea, constipation, dizziness, abdominal pain, skin rash, fevers, chills, night sweats, weight loss, swollen lymph nodes, body aches,, chest pain, shortness of breath, mood changes. POSITIVE muscle aches, joint swelling,  Objective  Blood pressure 118/84, pulse 91, height 5\' 6"  (1.676 m), weight 147 lb (66.7 kg), SpO2 99 %.   General: No apparent distress alert and oriented x3 mood and affect normal, dressed appropriately.  HEENT: Pupils equal, extraocular movements intact  Respiratory: Patient's speak in full sentences and does not appear short of breath  Cardiovascular: No lower extremity edema, non tender, no erythema  Neuro: Cranial nerves II through XII are intact, neurovascularly intact in all extremities with 2+ DTRs and 2+ pulses.  Gait severely antalgic MSK:   Patient's right ankle does have significant effusion noted.  Limited dorsiflexion of 10 degrees, patient does have full plantar flexion.  Patient has increasing tenderness with inversion range of motion.  Patient is moderately tender to palpation over the talar dome laterally.  Due to swelling unable to do anterior drawer test.  Neurovascularly intact distally.    Impression and Recommendations:     This case required medical decision making of moderate complexity. The above documentation has  been reviewed and is accurate and complete Lyndal Pulley, DO       Note: This dictation was prepared with Dragon dictation along with smaller phrase technology. Any transcriptional errors that result from this process are unintentional.

## 2019-12-31 NOTE — Assessment & Plan Note (Signed)
Patient has had this for 2 years and has been slowly worsening.  Most recent MRI shows that patient size of the talar dome injury and is not increased which is good but patient is having increasing instability of the ankle.  Patient is very concerned that she is going to fall at some point.  We discussed with patient at great length about different treatment options and experimental things such as PRP.  CAM Walker given today and I would like to refer patient to another orthopedic surgeon to discuss the possibility of surgical intervention at this time.  Patient is in agreement with the plan.  Follow-up with me again more on an as-needed basis.  Total time with patient reviewing my previous notes, previous imaging, new imaging and notes and discussing options 41 minutes.

## 2020-01-07 ENCOUNTER — Ambulatory Visit: Payer: 59 | Admitting: Family Medicine

## 2020-01-20 ENCOUNTER — Ambulatory Visit: Payer: 59 | Admitting: Family Medicine

## 2020-01-28 ENCOUNTER — Other Ambulatory Visit: Payer: Self-pay | Admitting: Family Medicine

## 2020-03-01 ENCOUNTER — Other Ambulatory Visit: Payer: Self-pay | Admitting: Family Medicine

## 2020-03-05 ENCOUNTER — Other Ambulatory Visit: Payer: Self-pay

## 2020-03-05 MED ORDER — ATORVASTATIN CALCIUM 10 MG PO TABS: ORAL_TABLET | ORAL | 1 refills | Status: DC

## 2020-03-17 ENCOUNTER — Encounter: Payer: Self-pay | Admitting: Family Medicine

## 2020-03-17 ENCOUNTER — Other Ambulatory Visit: Payer: Self-pay

## 2020-03-17 ENCOUNTER — Telehealth (INDEPENDENT_AMBULATORY_CARE_PROVIDER_SITE_OTHER): Payer: Self-pay | Admitting: Family Medicine

## 2020-03-17 DIAGNOSIS — R309 Painful micturition, unspecified: Secondary | ICD-10-CM

## 2020-03-17 DIAGNOSIS — E785 Hyperlipidemia, unspecified: Secondary | ICD-10-CM

## 2020-03-17 DIAGNOSIS — E559 Vitamin D deficiency, unspecified: Secondary | ICD-10-CM

## 2020-03-17 LAB — COMPREHENSIVE METABOLIC PANEL
ALT: 10 U/L (ref 0–35)
AST: 12 U/L (ref 0–37)
Albumin: 4.1 g/dL (ref 3.5–5.2)
Alkaline Phosphatase: 100 U/L (ref 39–117)
BUN: 13 mg/dL (ref 6–23)
CO2: 28 mEq/L (ref 19–32)
Calcium: 9.4 mg/dL (ref 8.4–10.5)
Chloride: 106 mEq/L (ref 96–112)
Creatinine, Ser: 0.96 mg/dL (ref 0.40–1.20)
GFR: 73.36 mL/min (ref >60.00)
Glucose, Bld: 91 mg/dL (ref 70–99)
Potassium: 4.5 mEq/L (ref 3.5–5.1)
Sodium: 140 mEq/L (ref 135–145)
Total Bilirubin: 0.3 mg/dL (ref 0.2–1.2)
Total Protein: 6.9 g/dL (ref 6.0–8.3)

## 2020-03-17 LAB — LIPID PANEL
Cholesterol: 152 mg/dL (ref 0–200)
HDL: 27.9 mg/dL — ABNORMAL LOW (ref >39.00)
NonHDL: 123.72
Total CHOL/HDL Ratio: 5
Triglycerides: 221 mg/dL — ABNORMAL HIGH (ref 0.0–149.0)
VLDL: 44.2 mg/dL — ABNORMAL HIGH (ref 0.0–40.0)

## 2020-03-17 LAB — POC URINALSYSI DIPSTICK (AUTOMATED)
Bilirubin, UA: NEGATIVE
Glucose, UA: NEGATIVE
Ketones, UA: NEGATIVE
Nitrite, UA: NEGATIVE
Protein, UA: POSITIVE — AB
Spec Grav, UA: 1.02 (ref 1.010–1.025)
Urobilinogen, UA: NEGATIVE E.U./dL — AB
pH, UA: 5 (ref 5.0–8.0)

## 2020-03-17 LAB — LDL CHOLESTEROL, DIRECT: Direct LDL: 90 mg/dL

## 2020-03-17 LAB — VITAMIN D 25 HYDROXY (VIT D DEFICIENCY, FRACTURES): VITD: 24.68 ng/mL — ABNORMAL LOW (ref 30.00–100.00)

## 2020-03-17 MED ORDER — NITROFURANTOIN MONOHYD MACRO 100 MG PO CAPS: 100.0000 mg | ORAL_CAPSULE | Freq: Two times a day (BID) | ORAL | 0 refills | Status: DC

## 2020-03-17 NOTE — Progress Notes (Signed)
Virtual Visit via Video Note  I connected with [name] on 03/17/20 at  9:40 AM EDT by a video enabled telemedicine application and verified that I am speaking with the correct person using two identifiers.  Location: Patient: home alone  Provider: home    I discussed the limitations of evaluation and management by telemedicine and the availability of in person appointments. The patient expressed understanding and agreed to proceed.  History of Present Illness: Pt is home c/o painful urination x several days , it is also cloudy   No d/c , no odor no fevers     Observations/Objective: There were no vitals filed for this visit. Pt is in nad   Assessment and Plan: 1. Painful urination abx ,  Pt will come in today for ua and culture  - nitrofurantoin, macrocrystal-monohydrate, (MACROBID) 100 MG capsule; Take 1 capsule (100 mg total) by mouth 2 (two) times daily.  Dispense: 14 capsule; Refill: 0 - Urine Culture; Future - POCT Urinalysis Dipstick (Automated); Future - POCT Urinalysis Dipstick (Automated) - Urine Culture  2. Hyperlipidemia, unspecified hyperlipidemia type Check labs  Tolerating statin, encouraged heart healthy diet, avoid trans fats, minimize simple carbs and saturated fats. Increase exercise as tolerated - Comprehensive metabolic panel - Lipid panel  3. Vitamin D deficiency Check labs  - Vitamin D (25 hydroxy)   Follow Up Instructions:    I discussed the assessment and treatment plan with the patient. The patient was provided an opportunity to ask questions and all were answered. The patient agreed with the plan and demonstrated an understanding of the instructions.   The patient was advised to call back or seek an in-person evaluation if the symptoms worsen or if the condition fails to improve as anticipated.  I provided 25 minutes of non-face-to-face time during this encounter.   [name], DO

## 2020-03-19 ENCOUNTER — Other Ambulatory Visit: Payer: Self-pay

## 2020-03-19 ENCOUNTER — Other Ambulatory Visit: Payer: Self-pay | Admitting: Family Medicine

## 2020-03-19 DIAGNOSIS — N390 Urinary tract infection, site not specified: Secondary | ICD-10-CM

## 2020-03-19 LAB — URINE CULTURE
MICRO NUMBER:: 10571003
SPECIMEN QUALITY:: ADEQUATE

## 2020-03-19 MED ORDER — FENOFIBRATE 160 MG PO TABS: 160.0000 mg | ORAL_TABLET | Freq: Every day | ORAL | 2 refills | Status: DC

## 2020-03-19 MED ORDER — CIPROFLOXACIN HCL 250 MG PO TABS: 250.0000 mg | ORAL_TABLET | Freq: Two times a day (BID) | ORAL | 0 refills | Status: DC

## 2020-04-29 ENCOUNTER — Telehealth: Payer: Self-pay | Admitting: *Deleted

## 2020-04-29 MED ORDER — METOPROLOL SUCCINATE ER 50 MG PO TB24: 50.0000 mg | ORAL_TABLET | Freq: Every day | ORAL | 0 refills | Status: DC

## 2020-04-29 NOTE — Telephone Encounter (Signed)
Received fax from [facility] requesting Metoprolol ER 50mg . Refill sent.

## 2020-06-13 ENCOUNTER — Other Ambulatory Visit: Payer: Self-pay | Admitting: Family Medicine

## 2020-08-19 ENCOUNTER — Other Ambulatory Visit: Payer: Self-pay

## 2020-08-19 MED ORDER — METOPROLOL SUCCINATE ER 50 MG PO TB24: 50.0000 mg | ORAL_TABLET | Freq: Every day | ORAL | 0 refills | Status: DC

## 2020-09-06 ENCOUNTER — Other Ambulatory Visit: Payer: Self-pay | Admitting: Family Medicine

## 2020-10-05 ENCOUNTER — Other Ambulatory Visit: Payer: Self-pay | Admitting: Family Medicine

## 2020-10-15 ENCOUNTER — Other Ambulatory Visit: Payer: Self-pay | Admitting: Family Medicine

## 2021-01-13 ENCOUNTER — Other Ambulatory Visit: Payer: Self-pay | Admitting: Family Medicine

## 2021-01-13 ENCOUNTER — Other Ambulatory Visit: Payer: Self-pay

## 2021-01-13 MED ORDER — METOPROLOL SUCCINATE ER 50 MG PO TB24: 50.0000 mg | ORAL_TABLET | Freq: Every day | ORAL | 0 refills | Status: DC

## 2021-02-17 ENCOUNTER — Other Ambulatory Visit: Payer: Self-pay | Admitting: Family Medicine

## 2021-02-17 ENCOUNTER — Other Ambulatory Visit: Payer: Self-pay

## 2021-03-05 ENCOUNTER — Other Ambulatory Visit: Payer: Self-pay | Admitting: Family Medicine

## 2021-03-09 ENCOUNTER — Other Ambulatory Visit: Payer: Self-pay

## 2021-03-09 ENCOUNTER — Telehealth: Payer: Self-pay | Admitting: Family Medicine

## 2021-03-09 MED ORDER — ATORVASTATIN CALCIUM 10 MG PO TABS: ORAL_TABLET | ORAL | 1 refills | Status: DC

## 2021-03-09 NOTE — Telephone Encounter (Signed)
Refill sent.

## 2021-03-09 NOTE — Telephone Encounter (Signed)
Medication: atorvastatin (LIPITOR) 10 MG tablet [[id number]]       Has the patient contacted their pharmacy? yes (If no, request that the patient contact the pharmacy for the refill.) (If yes, when and what did the pharmacy advise?) Call pharmacy    Preferred Pharmacy (with phone number or street name): [facility] [place], [address], [place] [address]  Phone:  [phone] Fax:  [phone]      Agent: Please be advised that RX refills may take up to 3 business days. We ask that you follow-up with your pharmacy.

## 2021-03-21 ENCOUNTER — Other Ambulatory Visit: Payer: Self-pay | Admitting: Family Medicine

## 2021-03-24 ENCOUNTER — Other Ambulatory Visit: Payer: Self-pay

## 2021-03-25 ENCOUNTER — Encounter: Payer: Self-pay | Admitting: Family Medicine

## 2021-03-25 ENCOUNTER — Ambulatory Visit (INDEPENDENT_AMBULATORY_CARE_PROVIDER_SITE_OTHER): Payer: Commercial Managed Care - PPO | Admitting: Family Medicine

## 2021-03-25 VITALS — BP 120/78 | HR 84 | Temp 98.8°F | Resp 18 | Ht 66.0 in | Wt 152.0 lb

## 2021-03-25 DIAGNOSIS — Z0000 Encounter for general adult medical examination without abnormal findings: Secondary | ICD-10-CM | POA: Insufficient documentation

## 2021-03-25 DIAGNOSIS — Z23 Encounter for immunization: Secondary | ICD-10-CM

## 2021-03-25 DIAGNOSIS — I10 Essential (primary) hypertension: Secondary | ICD-10-CM

## 2021-03-25 DIAGNOSIS — F411 Generalized anxiety disorder: Secondary | ICD-10-CM

## 2021-03-25 DIAGNOSIS — E785 Hyperlipidemia, unspecified: Secondary | ICD-10-CM

## 2021-03-25 DIAGNOSIS — F17200 Nicotine dependence, unspecified, uncomplicated: Secondary | ICD-10-CM

## 2021-03-25 DIAGNOSIS — Z1211 Encounter for screening for malignant neoplasm of colon: Secondary | ICD-10-CM | POA: Diagnosis not present

## 2021-03-25 DIAGNOSIS — E2839 Other primary ovarian failure: Secondary | ICD-10-CM

## 2021-03-25 NOTE — Patient Instructions (Signed)
Preventive Care 78-55 Years Old, Female Preventive care refers to lifestyle choices and visits with your health care provider that can promote health and wellness. This includes: A yearly physical exam. This is also called an annual wellness visit. Regular dental and eye exams. Immunizations. Screening for certain conditions. Healthy lifestyle choices, such as: Eating a healthy diet. Getting regular exercise. Not using drugs or products that contain nicotine and tobacco. Limiting alcohol use. What can I expect for my preventive care visit? Physical exam Your health care provider will check your: Height and weight. These may be used to calculate your BMI (body mass index). BMI is a measurement that tells if you are at a healthy weight. Heart rate and blood pressure. Body temperature. Skin for abnormal spots. Counseling Your health care provider may ask you questions about your: Past medical problems. Family's medical history. Alcohol, tobacco, and drug use. Emotional well-being. Home life and relationship well-being. Sexual activity. Diet, exercise, and sleep habits. Work and work Statistician. Access to firearms. Method of birth control. Menstrual cycle. Pregnancy history. What immunizations do I need?  Vaccines are usually given at various ages, according to a schedule. Your health care provider will recommend vaccines for you based on your age, medicalhistory, and lifestyle or other factors, such as travel or where you work. What tests do I need? Blood tests Lipid and cholesterol levels. These may be checked every 5 years, or more often if you are over 55 years old. Hepatitis C test. Hepatitis B test. Screening Lung cancer screening. You may have this screening every year starting at age 55 if you have a 30-pack-year history of smoking and currently smoke or have quit within the past 15 years. Colorectal cancer screening. All adults should have this screening starting at  age 55 and continuing until age 80. Your health care provider may recommend screening at age 55 if you are at increased risk. You will have tests every 1-10 years, depending on your results and the type of screening test. Diabetes screening. This is done by checking your blood sugar (glucose) after you have not eaten for a while (fasting). You may have this done every 1-3 years. Mammogram. This may be done every 1-2 years. Talk with your health care provider about when you should start having regular mammograms. This may depend on whether you have a family history of breast cancer. BRCA-related cancer screening. This may be done if you have a family history of breast, ovarian, tubal, or peritoneal cancers. Pelvic exam and Pap test. This may be done every 3 years starting at age 55. Starting at age 55, this may be done every 5 years if you have a Pap test in combination with an HPV test. Other tests STD (sexually transmitted disease) testing, if you are at risk. Bone density scan. This is done to screen for osteoporosis. You may have this scan if you are at high risk for osteoporosis. Talk with your health care provider about your test results, treatment options,and if necessary, the need for more tests. Follow these instructions at home: Eating and drinking  Eat a diet that includes fresh fruits and vegetables, whole grains, lean protein, and low-fat dairy products. Take vitamin and mineral supplements as recommended by your health care provider. Do not drink alcohol if: Your health care provider tells you not to drink. You are pregnant, may be pregnant, or are planning to become pregnant. If you drink alcohol: Limit how much you have to 0-1 drink a day. Be aware  of how much alcohol is in your drink. In the [place] one drink equals one 12 oz bottle of beer (355 mL), one 5 oz glass of wine (148 mL), or one 1 oz glass of hard liquor (44 mL).  Lifestyle Take daily care of your teeth and  gums. Brush your teeth every morning and night with fluoride toothpaste. Floss one time each day. Stay active. Exercise for at least 30 minutes 5 or more days each week. Do not use any products that contain nicotine or tobacco, such as cigarettes, e-cigarettes, and chewing tobacco. If you need help quitting, ask your health care provider. Do not use drugs. If you are sexually active, practice safe sex. Use a condom or other form of protection to prevent STIs (sexually transmitted infections). If you do not wish to become pregnant, use a form of birth control. If you plan to become pregnant, see your health care provider for a prepregnancy visit. If told by your health care provider, take low-dose aspirin daily starting at age 55. Find healthy ways to cope with stress, such as: Meditation, yoga, or listening to music. Journaling. Talking to a trusted person. Spending time with friends and family. Safety Always wear your seat belt while driving or riding in a vehicle. Do not drive: If you have been drinking alcohol. Do not ride with someone who has been drinking. When you are tired or distracted. While texting. Wear a helmet and other protective equipment during sports activities. If you have firearms in your house, make sure you follow all gun safety procedures. What's next? Visit your health care provider once a year for an annual wellness visit. Ask your health care provider how often you should have your eyes and teeth checked. Stay up to date on all vaccines. This information is not intended to replace advice given to you by your health care provider. Make sure you discuss any questions you have with your healthcare provider. Document Revised: 06/29/2020 Document Reviewed: 06/06/2018 Elsevier Patient Education  2022 [employer].

## 2021-03-25 NOTE — Assessment & Plan Note (Signed)
Still smoking  Pt understands the risks of smoking

## 2021-03-25 NOTE — Assessment & Plan Note (Signed)
Well controlled, no changes to meds. Encouraged heart healthy diet such as the DASH diet and exercise as tolerated.  °

## 2021-03-25 NOTE — Assessment & Plan Note (Signed)
stable °

## 2021-03-25 NOTE — Assessment & Plan Note (Signed)
Tolerating statin, encouraged heart healthy diet, avoid trans fats, minimize simple carbs and saturated fats. Increase exercise as tolerated 

## 2021-03-25 NOTE — Assessment & Plan Note (Signed)
ghm utd Check labs  See avs  

## 2021-03-25 NOTE — Progress Notes (Signed)
Patient ID: [name], female    DOB: 15-Feb-1966  Age: 55 y.o. MRN: [id number]    Subjective:  Subjective  HPI [name] presents for a comprehensive physical examination today. She complains of constant hot flashes. She reports that she doesn't wish to receive medication for it. She states that the hot flashes occurred after a total hysterectomy. She reports that she had gained weight and is exercise regularly to lose it.  Wt Readings from Last 3 Encounters:  03/25/21 152 lb (68.9 kg)  12/31/19 147 lb (66.7 kg)  05/08/19 144 lb 12.8 oz (65.7 kg)  She denies any chest pain, SOB, fever, abdominal pain, cough, chills, sore throat, dysuria, urinary incontinence, back pain, HA, or N/V/D at this time. She is planning on receiving a colonoscopy. Pt agrees to the shingles vaccine. She notes that she has difficulty with her sister, who has a hx of stroke.   Review of Systems  Constitutional:  Negative for chills, fatigue and fever.  HENT:  Negative for ear pain, rhinorrhea, sinus pressure, sinus pain, sore throat and tinnitus.   Eyes:  Negative for pain.  Respiratory:  Negative for cough, shortness of breath and wheezing.   Cardiovascular:  Negative for chest pain.  Gastrointestinal:  Negative for abdominal pain, anal bleeding, constipation, diarrhea, nausea and vomiting.  Endocrine:       (+) hot flashes   Genitourinary:  Negative for flank pain.  Musculoskeletal:  Negative for back pain and neck pain.  Skin:  Negative for rash.  Neurological:  Negative for seizures, weakness, light-headedness, numbness and headaches.   History Past Medical History:  Diagnosis Date   Anxiety    Breast cancer [facility]) 1999   2004   Headache    History of exercise stress test    GXT 9/19:  Exercised 5'48"; METs: 7; Normal BP response; No ischemic EKG changes   Hyperlipidemia    Numbness     She has a past surgical history that includes Abdominal hysterectomy (2006); Mastectomy; Corneal transplant  (Bilateral); Eye surgery; and Breast surgery.   Her family history includes Aortic aneurysm in her mother and another family member; Hyperlipidemia in her sister; Hypertension in an other family member; Stroke (age of onset: 72) in her sister.She reports that she has been smoking cigarettes. She has a 6.75 pack-year smoking history. She has never used smokeless tobacco. She reports current alcohol use. She reports that she does not use drugs.  Current Outpatient Medications on File Prior to Visit  Medication Sig Dispense Refill   acyclovir (ZOVIRAX) 400 MG tablet TAKE 1 TABLET(400 MG) BY MOUTH TWICE DAILY 180 tablet 0   ALPRAZolam (XANAX) 0.25 MG tablet Take 1 tablet (0.25 mg total) by mouth 2 (two) times daily as needed. for anxiety 15 tablet 0   aspirin 81 MG tablet Take 1 tablet (81 mg total) by mouth daily. 30 tablet    atorvastatin (LIPITOR) 10 MG tablet TAKE 1 TABLET BY MOUTH DAILY AT 6 PM 90 tablet 1   fenofibrate 160 MG tablet TAKE 1 TABLET(160 MG) BY MOUTH DAILY 30 tablet 2   metoprolol succinate (TOPROL-XL) 50 MG 24 hr tablet TAKE 1 TABLET(50 MG) BY MOUTH DAILY 14 tablet 0   No current facility-administered medications on file prior to visit.     Objective:  Objective  Physical Exam Vitals and nursing note reviewed.  Constitutional:      General: She is not in acute distress.    Appearance: Normal appearance. She is  well-developed. She is not ill-appearing.  HENT:     Head: Normocephalic and atraumatic.     Right Ear: Tympanic membrane, ear canal and external ear normal.     Left Ear: Tympanic membrane, ear canal and external ear normal.     Nose: Nose normal.  Eyes:     General:        Right eye: No discharge.        Left eye: No discharge.     Extraocular Movements: Extraocular movements intact.     Pupils: Pupils are equal, round, and reactive to light.  Cardiovascular:     Rate and Rhythm: Normal rate and regular rhythm.     Pulses: Normal pulses.     Heart sounds:  Normal heart sounds. No murmur heard.   No friction rub. No gallop.  Pulmonary:     Effort: Pulmonary effort is normal. No respiratory distress.     Breath sounds: Normal breath sounds. No stridor. No wheezing, rhonchi or rales.  Chest:     Chest wall: No tenderness.  Abdominal:     General: Bowel sounds are normal. There is no distension.     Palpations: Abdomen is soft. There is no mass.     Tenderness: There is no abdominal tenderness. There is no guarding or rebound.     Hernia: No hernia is present.  Musculoskeletal:        General: Normal range of motion.     Cervical back: Normal range of motion and neck supple.     Right lower leg: No edema.     Left lower leg: No edema.  Skin:    General: Skin is warm and dry.  Neurological:     Mental Status: She is alert and oriented to person, place, and time.  Psychiatric:        Behavior: Behavior normal.        Thought Content: Thought content normal.   BP 120/78 (BP Location: Left Arm, Patient Position: Sitting, Cuff Size: Normal)   Pulse 84   Temp 98.8 F (37.1 C) (Oral)   Resp 18   Ht 5\' 6"  (1.676 m)   Wt 152 lb (68.9 kg)   SpO2 99%   BMI 24.53 kg/m  Wt Readings from Last 3 Encounters:  03/25/21 152 lb (68.9 kg)  12/31/19 147 lb (66.7 kg)  05/08/19 144 lb 12.8 oz (65.7 kg)     Lab Results  Component Value Date   WBC 5.9 05/08/2019   HGB 14.4 05/08/2019   HCT 43.1 05/08/2019   PLT 246.0 05/08/2019   GLUCOSE 91 03/17/2020   CHOL 152 03/17/2020   TRIG 221.0 (H) 03/17/2020   HDL 27.90 (L) 03/17/2020   LDLDIRECT 90.0 03/17/2020   LDLCALC 83 11/11/2019   ALT 10 03/17/2020   AST 12 03/17/2020   NA 140 03/17/2020   K 4.5 03/17/2020   CL 106 03/17/2020   CREATININE 0.96 03/17/2020   BUN 13 03/17/2020   CO2 28 03/17/2020   TSH 0.57 05/08/2019   HGBA1C 5.6 12/02/2013   MICROALBUR <0.7 01/24/2016    MR ANKLE RIGHT WO CONTRAST  Result Date: 08/24/2018 CLINICAL DATA:  Chronic ankle pain. EXAM: MRI OF THE RIGHT  ANKLE WITHOUT CONTRAST TECHNIQUE: Multiplanar, multisequence MR imaging of the ankle was performed. No intravenous contrast was administered. COMPARISON:  Right ankle MRI dated June 22, 2016. FINDINGS: TENDONS Peroneal: Peroneal longus tendon intact. Peroneal brevis intact. Posteromedial: Posterior tibial tendon intact. Trace fluid in the posterior  tibialis tendon sheath is likely physiologic. Flexor hallucis longus tendon intact. Flexor digitorum longus tendon intact. Anterior: Tibialis anterior tendon intact. Extensor hallucis longus tendon intact Extensor digitorum longus tendon intact. Achilles:  Intact. Plantar Fascia: Intact. LIGAMENTS Lateral: Anterior talofibular ligament intact. Calcaneofibular ligament intact. Posterior talofibular ligament intact. Anterior and posterior tibiofibular ligaments intact. Medial: Deltoid ligament intact. Spring ligament intact. Lisfranc ligament intact. CARTILAGE Ankle Joint: No joint effusion. Unchanged 6 mm osteochondral lesion of the central tibial plafond with a cortical divot. Underlying 2.5 x 1.1 x 1.4 cm cystic lesion with thin corticated margins and several smaller cystic lesions nearby are similar to prior study. Surrounding marrow edema appears slightly improved, particularly along the medial aspect of the distal tibia. Unchanged small non- fragmented osteochondral lesion of the lateral talar dome. Subtalar Joints/Sinus Tarsi: Normal subtalar joints. No subtalar joint effusion. Normal sinus tarsi. Bones: No fracture or dislocation. Soft Tissue: No soft tissue mass or fluid collection. IMPRESSION: 1. 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 The degree of surrounding marrow edema has slightly decreased. 2. Unchanged small non-fragmented osteochondral lesion of the lateral talar dome. Electronically Signed   By: Titus Dubin M.D.   On: 08/24/2018 17:31     Assessment &  Plan:  Plan    No orders of the defined types were placed in this encounter.   Problem List Items Addressed This Visit       Unprioritized   Generalized anxiety disorder    stable       HTN (hypertension)    Well controlled, no changes to meds. Encouraged heart healthy diet such as the DASH diet and exercise as tolerated.        Relevant Orders   CBC with Differential/Platelet   Comprehensive metabolic panel   Hyperlipidemia    Tolerating statin, encouraged heart healthy diet, avoid trans fats, minimize simple carbs and saturated fats. Increase exercise as tolerated       Relevant Orders   Comprehensive metabolic panel   Lipid panel   Preventative health care - Primary    ghm utd Check labs  See avs       Relevant Orders   CBC with Differential/Platelet   Comprehensive metabolic panel   Lipid panel   TSH   TOBACCO USE    Still smoking  Pt understands the risks of smoking        Other Visit Diagnoses     Colon cancer screening       Relevant Orders   Ambulatory referral to Gastroenterology   Need for shingles vaccine       Relevant Orders   Varicella-zoster vaccine IM (Completed)   Estrogen deficiency       Relevant Orders   DG Bone Density     Dexa: Last completed on 07/04/2012, repeat every 2 years.  Mammo: Last completed on 10/11/2010.  Pap Smear: Last completed on 07/18/2013, results were normal, repeat every 3 years.   Follow-up: Return in about 8 weeks (around 05/20/2021), or shigrix #2 ,   6 months f/u.   I,Gordon Zheng,acting as a Education administrator for [employer], DO.,have documented all relevant documentation on the behalf of [name], DO,as directed by  [name], DO while in the presence of [place], DO, have reviewed all documentation for this visit. The documentation on 03/25/21 for the exam,  diagnosis, procedures, and orders are all accurate and complete.

## 2021-03-26 LAB — COMPREHENSIVE METABOLIC PANEL
AG Ratio: 1.5 (calc) (ref 1.0–2.5)
ALT: 22 U/L (ref 6–29)
AST: 20 U/L (ref 10–35)
Albumin: 4.1 g/dL (ref 3.6–5.1)
Alkaline phosphatase (APISO): 41 U/L (ref 37–153)
BUN/Creatinine Ratio: 17 (calc) (ref 6–22)
BUN: 19 mg/dL (ref 7–25)
CO2: 26 mmol/L (ref 20–32)
Calcium: 9.9 mg/dL (ref 8.6–10.4)
Chloride: 105 mmol/L (ref 98–110)
Creat: 1.13 mg/dL — ABNORMAL HIGH (ref 0.50–1.05)
Globulin: 2.8 g/dL (calc) (ref 1.9–3.7)
Glucose, Bld: 72 mg/dL (ref 65–99)
Potassium: 4.8 mmol/L (ref 3.5–5.3)
Sodium: 140 mmol/L (ref 135–146)
Total Bilirubin: 0.3 mg/dL (ref 0.2–1.2)
Total Protein: 6.9 g/dL (ref 6.1–8.1)

## 2021-03-26 LAB — CBC WITH DIFFERENTIAL/PLATELET
Absolute Monocytes: 605 cells/uL (ref 200–950)
Basophils Absolute: 32 cells/uL (ref 0–200)
Basophils Relative: 0.5 %
Eosinophils Absolute: 422 cells/uL (ref 15–500)
Eosinophils Relative: 6.7 %
HCT: 38.5 % (ref 35.0–45.0)
Hemoglobin: 12.8 g/dL (ref 11.7–15.5)
Lymphs Abs: 2394 cells/uL (ref 850–3900)
MCH: 30.6 pg (ref 27.0–33.0)
MCHC: 33.2 g/dL (ref 32.0–36.0)
MCV: 92.1 fL (ref 80.0–100.0)
MPV: 11.4 fL (ref 7.5–12.5)
Monocytes Relative: 9.6 %
Neutro Abs: 2848 cells/uL (ref 1500–7800)
Neutrophils Relative %: 45.2 %
Platelets: 305 10*3/uL (ref 140–400)
RBC: 4.18 10*6/uL (ref 3.80–5.10)
RDW: 12.1 % (ref 11.0–15.0)
Total Lymphocyte: 38 %
WBC: 6.3 10*3/uL (ref 3.8–10.8)

## 2021-03-26 LAB — LIPID PANEL
Cholesterol: 133 mg/dL (ref <200)
HDL: 32 mg/dL — ABNORMAL LOW (ref >=50)
LDL Cholesterol (Calc): 82 mg/dL (calc)
Non-HDL Cholesterol (Calc): 101 mg/dL (calc) (ref <130)
Total CHOL/HDL Ratio: 4.2 (calc) (ref <5.0)
Triglycerides: 93 mg/dL (ref <150)

## 2021-03-26 LAB — TSH: TSH: 1.15 mIU/L

## 2021-03-28 ENCOUNTER — Telehealth: Payer: Self-pay | Admitting: Family Medicine

## 2021-03-28 ENCOUNTER — Encounter: Payer: Self-pay | Admitting: Family Medicine

## 2021-03-28 NOTE — Telephone Encounter (Signed)
Pt, called she was seeing on Friday 03/25/21 she received the shingles injection pt, states she is feeling bad, body ache, nausea and headache she would like a doctor note to excuse her from work today 03/28/21  Please advice

## 2021-03-28 NOTE — Telephone Encounter (Signed)
See My chart message

## 2021-04-04 ENCOUNTER — Other Ambulatory Visit (HOSPITAL_BASED_OUTPATIENT_CLINIC_OR_DEPARTMENT_OTHER): Payer: Commercial Managed Care - PPO

## 2021-04-09 ENCOUNTER — Other Ambulatory Visit: Payer: Self-pay | Admitting: Family Medicine

## 2021-04-12 ENCOUNTER — Other Ambulatory Visit: Payer: Self-pay

## 2021-04-12 MED ORDER — METOPROLOL SUCCINATE ER 50 MG PO TB24: ORAL_TABLET | ORAL | 1 refills | Status: DC

## 2021-04-27 ENCOUNTER — Telehealth: Payer: Self-pay

## 2021-04-27 NOTE — Telephone Encounter (Signed)
Patient wants to know if she is supposed to only take fenofibrate instead of both fenofibrate and Lipitor , states she doesn't remember being told to stop taking Lipitor but the pharmacy stated Lipitor was D/C

## 2021-04-28 MED ORDER — ATORVASTATIN CALCIUM 10 MG PO TABS: ORAL_TABLET | ORAL | 1 refills | Status: DC

## 2021-04-28 NOTE — Telephone Encounter (Signed)
Please advise 

## 2021-04-28 NOTE — Telephone Encounter (Signed)
Spoke w/ Pt- informed per PCP she should be on both- rx resent for Lipitor to [facility]. Pt verbalized understanding.

## 2021-04-28 NOTE — Addendum Note (Signed)
Addended byDamita Dunnings D on: 04/28/2021 04:05 PM   Modules accepted: Orders

## 2021-05-27 ENCOUNTER — Ambulatory Visit: Payer: Commercial Managed Care - PPO

## 2021-06-02 ENCOUNTER — Ambulatory Visit (HOSPITAL_BASED_OUTPATIENT_CLINIC_OR_DEPARTMENT_OTHER)
Admission: RE | Admit: 2021-06-02 | Discharge: 2021-06-02 | Disposition: A | Discharge status: Home / Self Care | Payer: Commercial Managed Care - PPO | Source: Ambulatory Visit | Attending: Family Medicine | Admitting: Family Medicine

## 2021-06-02 ENCOUNTER — Other Ambulatory Visit: Payer: Self-pay

## 2021-06-02 ENCOUNTER — Ambulatory Visit (INDEPENDENT_AMBULATORY_CARE_PROVIDER_SITE_OTHER): Payer: Commercial Managed Care - PPO

## 2021-06-02 DIAGNOSIS — E2839 Other primary ovarian failure: Secondary | ICD-10-CM | POA: Diagnosis not present

## 2021-06-02 DIAGNOSIS — Z23 Encounter for immunization: Secondary | ICD-10-CM | POA: Diagnosis not present

## 2021-06-02 NOTE — Progress Notes (Signed)
[name] is a 55 y.o. female presents to the office today for her 2nd Shingles injection, per physician's orders. Varicella- zoster vaccine administered IM R deltoid today. Patient tolerated injection. Patient due for follow up labs/provider appt: No- UTD   -Pt asks for a work note to be out d/t side effects of the injection. She says the last time she had to miss days of work since she felt so sick. Pt will come pick up the note once its ready.   [place], Darlis Loan

## 2021-06-03 ENCOUNTER — Ambulatory Visit: Payer: Commercial Managed Care - PPO

## 2021-09-21 ENCOUNTER — Other Ambulatory Visit: Payer: Self-pay | Admitting: Family Medicine

## 2021-09-22 ENCOUNTER — Telehealth: Payer: Self-pay | Admitting: Family Medicine

## 2021-09-22 ENCOUNTER — Other Ambulatory Visit: Payer: Self-pay | Admitting: Family Medicine

## 2021-09-22 MED ORDER — ATORVASTATIN CALCIUM 10 MG PO TABS: ORAL_TABLET | ORAL | 0 refills | Status: DC

## 2021-09-22 MED ORDER — FENOFIBRATE 160 MG PO TABS: ORAL_TABLET | ORAL | 0 refills | Status: DC

## 2021-09-22 MED ORDER — ACYCLOVIR 400 MG PO TABS: ORAL_TABLET | ORAL | 0 refills | Status: DC

## 2021-09-22 NOTE — Telephone Encounter (Signed)
Refills sent

## 2021-09-22 NOTE — Telephone Encounter (Signed)
Medication:  1.atorvastatin (LIPITOR) 10 MG tablet  2.metoprolol succinate (TOPROL-XL) 50 MG 24 hr tablet  3.fenofibrate 160 MG tablet   4.acyclovir (ZOVIRAX) 400 MG tablet   Has the patient contacted their pharmacy? Yes.   (If no, request that the patient contact the pharmacy for the refill.) (If yes, when and what did the pharmacy advise?) Patient had to move her appointment back due to being out of town, but needs a refill to hold her over until OV.   Preferred Pharmacy (with phone number or street name):  [id number] - [address], [address]  [place], [place] [address]  Phone:  [phone]  Fax:  [phone]   Agent: Please be advised that RX refills may take up to 3 business days. We ask that you follow-up with your pharmacy.

## 2021-09-24 ENCOUNTER — Other Ambulatory Visit: Payer: Self-pay | Admitting: Family Medicine

## 2021-09-26 ENCOUNTER — Ambulatory Visit: Payer: Commercial Managed Care - PPO | Admitting: Family Medicine

## 2021-10-11 ENCOUNTER — Encounter: Payer: Self-pay | Admitting: Family Medicine

## 2021-10-11 ENCOUNTER — Ambulatory Visit: Payer: Commercial Managed Care - PPO | Admitting: Family Medicine

## 2021-10-11 VITALS — BP 110/70 | HR 102 | Temp 98.2°F | Resp 18 | Ht 66.0 in | Wt 146.8 lb

## 2021-10-11 DIAGNOSIS — E785 Hyperlipidemia, unspecified: Secondary | ICD-10-CM | POA: Diagnosis not present

## 2021-10-11 DIAGNOSIS — J40 Bronchitis, not specified as acute or chronic: Secondary | ICD-10-CM | POA: Diagnosis not present

## 2021-10-11 DIAGNOSIS — I10 Essential (primary) hypertension: Secondary | ICD-10-CM | POA: Diagnosis not present

## 2021-10-11 MED ORDER — PROMETHAZINE-DM 6.25-15 MG/5ML PO SYRP: 5.0000 mL | ORAL_SOLUTION | Freq: Four times a day (QID) | ORAL | 0 refills | Status: DC | PRN

## 2021-10-11 MED ORDER — AZITHROMYCIN 250 MG PO TABS: ORAL_TABLET | ORAL | 0 refills | Status: DC

## 2021-10-11 MED ORDER — PREDNISONE 10 MG PO TABS: ORAL_TABLET | ORAL | 0 refills | Status: DC

## 2021-10-11 NOTE — Progress Notes (Signed)
5                                                                                                Established Patient Office Visit  Subjective:  Patient ID: [name], female    DOB: [birth year]  Age: 56 y.o. MRN: [id number]  CC:  Chief Complaint  Patient presents with   Hypertension   Hyperlipidemia   Follow-up    HPI [name] presents for f/u and c/o scratchy throat since Thursday ----- Friday she had 100.1 fever and chills and body aches.  She took a covid test 3x all neg   she is taking theraflu and nyquil with no relief.   She also is here for f/u bp and lipid.   Past Medical History:  Diagnosis Date   Anxiety    Breast cancer [facility]) 1999   2004   Headache    History of exercise stress test    GXT 9/19:  Exercised 5'48"; METs: 7; Normal BP response; No ischemic EKG changes   Hyperlipidemia    Numbness     Past Surgical History:  Procedure Laterality Date   ABDOMINAL HYSTERECTOMY  2006   TAH,BSO secondary bleeding and breast cancer--   BREAST SURGERY     CORNEAL TRANSPLANT Bilateral    6x, started in 1999, last was 2006.   EYE SURGERY     MASTECTOMY     bilateral---1999/2004    Family History  Problem Relation Age of Onset   Stroke Sister 15       after starting OCP   Hyperlipidemia Sister    Aortic aneurysm Mother    Aortic aneurysm Other    Hypertension Other    Seizures Neg Hx    Migraines Neg Hx     Social History   Socioeconomic History   Marital status: Single    Spouse name: Not on file   Number of children: 1   Years of education: 16   Highest education level: Not on file  Occupational History   Occupation: works for [employer]; office specialist  Tobacco Use   Smoking status: Some Days    Packs/day: 0.25    Years: 27.00    Pack years: 6.75    Types: Cigarettes   Smokeless tobacco: Never   Tobacco comments:    Trying to quit   Vaping Use   Vaping Use: Never used  Substance and Sexual Activity   Alcohol use: Yes    Alcohol/week: 0.0 standard drinks    Comment: Rare   Drug use: No   Sexual activity: Not Currently  Other Topics Concern   Not on file  Social History Narrative   Marital status: single      Children: one daughter      Lives: with sister      Employment: works for [employer]; office specialist      Education: Multimedia programmer      Tobacco: trying to quit      Alcohol: none; rare      Drugs: none  Exercise: walking daily 2 miles      Caffeine use: Tea/pepsi- 1 pepsi/morning      Tea- 3 cups per day   Right-handed   Social Determinants of Health   Financial Resource Strain: Not on file  Food Insecurity: Not on file  Transportation Needs: Not on file  Physical Activity: Not on file  Stress: Not on file  Social Connections: Not on file  Intimate Partner Violence: Not on file    Outpatient Medications Prior to Visit  Medication Sig Dispense Refill   acyclovir (ZOVIRAX) 400 MG tablet TAKE 1 TABLET(400 MG) BY MOUTH TWICE DAILY 180 tablet 0   ALPRAZolam (XANAX) 0.25 MG tablet Take 1 tablet (0.25 mg total) by mouth 2 (two) times daily as needed. for anxiety 15 tablet 0   aspirin 81 MG tablet Take 1 tablet (81 mg total) by mouth daily. 30 tablet    atorvastatin (LIPITOR) 10 MG tablet TAKE 1 TABLET BY MOUTH DAILY AT 6 PM 30 tablet 0   fenofibrate 160 MG tablet TAKE 1 TABLET(160 MG) BY MOUTH DAILY 30 tablet 0   metoprolol succinate (TOPROL-XL) 50 MG 24 hr tablet TAKE 1 TABLET(50 MG) BY MOUTH DAILY 90 tablet 1   No facility-administered medications prior to visit.    Allergies  Allergen Reactions   Penicillins Other (See Comments)    Childhood reaction    ROS Review of Systems  Constitutional:  Positive for fatigue. Negative for activity change, appetite change and unexpected weight change.  HENT:  Positive for congestion. Negative for nosebleeds, postnasal  drip, rhinorrhea, sinus pressure, sinus pain, sneezing and sore throat.   Respiratory:  Positive for cough and wheezing. Negative for shortness of breath.   Cardiovascular:  Negative for chest pain and palpitations.  Psychiatric/Behavioral:  Negative for behavioral problems and dysphoric mood. The patient is not nervous/anxious.      Objective:    Physical Exam Vitals and nursing note reviewed.  Constitutional:      Appearance: She is well-developed.  HENT:     Head: Normocephalic and atraumatic.     Right Ear: Tympanic membrane normal.     Left Ear: Tympanic membrane normal.     Nose: Nose normal.  Eyes:     Conjunctiva/sclera: Conjunctivae normal.  Neck:     Thyroid: No thyromegaly.     Vascular: No carotid bruit or JVD.  Cardiovascular:     Rate and Rhythm: Normal rate and regular rhythm.     Heart sounds: Normal heart sounds. No murmur heard. Pulmonary:     Effort: Pulmonary effort is normal. No respiratory distress.     Breath sounds: Wheezing present. No rales.  Chest:     Chest wall: No tenderness.  Musculoskeletal:     Cervical back: Normal range of motion and neck supple.  Neurological:     Mental Status: She is alert and oriented to person, place, and time.    BP 110/70 (BP Location: Right Arm, Patient Position: Sitting, Cuff Size: Normal)    Pulse (!) 102    Temp 98.2 F (36.8 C) (Oral)    Resp 18    Ht 5\' 6"  (1.676 m)    Wt 146 lb 12.8 oz (66.6 kg)    SpO2 95%    BMI 23.69 kg/m  Wt Readings from Last 3 Encounters:  10/11/21 146 lb 12.8 oz (66.6 kg)  03/25/21 152 lb (68.9 kg)  12/31/19 147 lb (66.7 kg)     Health Maintenance Due  Topic Date  Due   COLONOSCOPY (Pts 45-31yrs Insurance coverage will need to be confirmed)  Never done   TETANUS/TDAP  10/10/2019   COVID-19 Vaccine (3 - Booster for Pfizer series) 03/12/2020   INFLUENZA VACCINE  05/09/2021    There are no preventive care reminders to display for this patient.  Lab Results  Component Value  Date   TSH 1.15 03/25/2021   Lab Results  Component Value Date   WBC 6.3 03/25/2021   HGB 12.8 03/25/2021   HCT 38.5 03/25/2021   MCV 92.1 03/25/2021   PLT 305 03/25/2021   Lab Results  Component Value Date   NA 140 03/25/2021   K 4.8 03/25/2021   CO2 26 03/25/2021   GLUCOSE 72 03/25/2021   BUN 19 03/25/2021   CREATININE 1.13 (H) 03/25/2021   BILITOT 0.3 03/25/2021   ALKPHOS 100 03/17/2020   AST 20 03/25/2021   ALT 22 03/25/2021   PROT 6.9 03/25/2021   ALBUMIN 4.1 03/17/2020   CALCIUM 9.9 03/25/2021   ANIONGAP 11 10/31/2015   GFR 73.36 03/17/2020   Lab Results  Component Value Date   CHOL 133 03/25/2021   Lab Results  Component Value Date   HDL 32 (L) 03/25/2021   Lab Results  Component Value Date   LDLCALC 82 03/25/2021   Lab Results  Component Value Date   TRIG 93 03/25/2021   Lab Results  Component Value Date   CHOLHDL 4.2 03/25/2021   Lab Results  Component Value Date   HGBA1C 5.6 12/02/2013      Assessment & Plan:   Problem List Items Addressed This Visit       Unprioritized   Bronchitis - Primary    pred taper  z pak Cough syrup per orders  Call if symptoms do not improve       Relevant Medications   azithromycin (ZITHROMAX Z-PAK) 250 MG tablet   promethazine-dextromethorphan (PROMETHAZINE-DM) 6.25-15 MG/5ML syrup   HTN (hypertension)    Well controlled, no changes to meds. Encouraged heart healthy diet such as the DASH diet and exercise as tolerated.       Relevant Medications   predniSONE (DELTASONE) 10 MG tablet   Other Relevant Orders   CBC with Differential/Platelet   Comprehensive metabolic panel   Lipid panel   Hyperlipidemia    Encourage heart healthy diet such as MIND or DASH diet, increase exercise, avoid trans fats, simple carbohydrates and processed foods, consider a krill or fish or flaxseed oil cap daily.       Relevant Orders   CBC with Differential/Platelet   Comprehensive metabolic panel   Lipid panel     Meds ordered this encounter  Medications   azithromycin (ZITHROMAX Z-PAK) 250 MG tablet    Sig: As directed    Dispense:  6 each    Refill:  0   promethazine-dextromethorphan (PROMETHAZINE-DM) 6.25-15 MG/5ML syrup    Sig: Take 5 mLs by mouth 4 (four) times daily as needed.    Dispense:  118 mL    Refill:  0   predniSONE (DELTASONE) 10 MG tablet    Sig: TAKE 3 TABLETS PO QD FOR 3 DAYS THEN TAKE 2 TABLETS PO QD FOR 3 DAYS THEN TAKE 1 TABLET PO QD FOR 3 DAYS THEN TAKE 1/2 TAB PO QD FOR 3 DAYS    Dispense:  20 tablet    Refill:  0    Follow-up: Return in about 6 months (around 04/10/2022), or if symptoms worsen or fail to improve, for  annual exam, fasting.    [name], DO

## 2021-10-11 NOTE — Assessment & Plan Note (Signed)
Encourage heart healthy diet such as MIND or DASH diet, increase exercise, avoid trans fats, simple carbohydrates and processed foods, consider a krill or fish or flaxseed oil cap daily.  °

## 2021-10-11 NOTE — Assessment & Plan Note (Signed)
Well controlled, no changes to meds. Encouraged heart healthy diet such as the DASH diet and exercise as tolerated.  °

## 2021-10-11 NOTE — Assessment & Plan Note (Signed)
pred taper  z pak Cough syrup per orders  Call if symptoms do not improve

## 2021-10-11 NOTE — Patient Instructions (Signed)
Acute Bronchitis, Adult °Acute bronchitis is sudden inflammation of the main airways (bronchi) that come off the windpipe (trachea) in the lungs. The swelling causes the airways to get smaller and make more mucus than normal. This can make it hard to breathe and can cause coughing or noisy breathing (wheezing). °Acute bronchitis may last several weeks. The cough may last longer. Allergies, asthma, and exposure to smoke may make the condition worse. °What are the causes? °This condition can be caused by germs and by substances that irritate the lungs, including: °Cold and flu viruses. The most common cause of this condition is the virus that causes the common cold. °Bacteria. This is less common. °Breathing in substances that irritate the lungs, including: °Smoke from cigarettes and other forms of tobacco. °Dust and pollen. °Fumes from household cleaning products, gases, or burned fuel. °Indoor or outdoor air pollution. °What increases the risk? °The following factors may make you more likely to develop this condition: °A weak body's defense system, also called the immune system. °A condition that affects your lungs and breathing, such as asthma. °What are the signs or symptoms? °Common symptoms of this condition include: °Coughing. This may bring up clear, yellow, or green mucus from your lungs (sputum). °Wheezing. °Runny or stuffy nose. °Having too much mucus in your lungs (chest congestion). °Shortness of breath. °Aches and pains, including sore throat or chest. °How is this diagnosed? °This condition is usually diagnosed based on: °Your symptoms and medical history. °A physical exam. °You may also have other tests, including tests to rule out other conditions, such as pneumonia. These tests include: °A test of lung function. °Test of a mucus sample to look for the presence of bacteria. °Tests to check the oxygen level in your blood. °Blood tests. °Chest X-ray. °How is this treated? °Most cases of acute bronchitis  clear up over time without treatment. Your health care provider may recommend: °Drinking more fluids to help thin your mucus so it is easier to cough up. °Taking inhaled medicine (inhaler) to improve air flow in and out of your lungs. °Using a vaporizer or a humidifier. These are machines that add water to the air to help you breathe better. °Taking a medicine that thins mucus and clears congestion (expectorant). °Taking a medicine that prevents or stops coughing (cough suppressant). °It is notcommon to take an antibiotic medicine for this condition. °Follow these instructions at home: ° °Take over-the-counter and prescription medicines only as told by your health care provider. °Use an inhaler, vaporizer, or humidifier as told by your health care provider. °Take two teaspoons (10 mL) of honey at bedtime to lessen coughing at night. °Drink enough fluid to keep your urine pale yellow. °Do not use any products that contain nicotine or tobacco. These products include cigarettes, chewing tobacco, and vaping devices, such as e-cigarettes. If you need help quitting, ask your health care provider. °Get plenty of rest. °Return to your normal activities as told by your health care provider. Ask your health care provider what activities are safe for you. °Keep all follow-up visits. This is important. °How is this prevented? °To lower your risk of getting this condition again: °Wash your hands often with soap and water for at least 20 seconds. If soap and water are not available, use hand sanitizer. °Avoid contact with people who have cold symptoms. °Try not to touch your mouth, nose, or eyes with your hands. °Avoid breathing in smoke or chemical fumes. Breathing smoke or chemical fumes will make your condition   worse. °Get the flu shot every year. °Contact a health care provider if: °Your symptoms do not improve after 2 weeks. °You have trouble coughing up the mucus. °Your cough keeps you awake at night. °You have a  fever. °Get help right away if you: °Cough up blood. °Feel pain in your chest. °Have severe shortness of breath. °Faint or keep feeling like you are going to faint. °Have a severe headache. °Have a fever or chills that get worse. °These symptoms may represent a serious problem that is an emergency. Do not wait to see if the symptoms will go away. Get medical help right away. Call your local emergency services (911 in the [place] Do not drive yourself to the hospital. °Summary °Acute bronchitis is inflammation of the main airways (bronchi) that come off the windpipe (trachea) in the lungs. The swelling causes the airways to get smaller and make more mucus than normal. °Drinking more fluids can help thin your mucus so it is easier to cough up. °Take over-the-counter and prescription medicines only as told by your health care provider. °Do not use any products that contain nicotine or tobacco. These products include cigarettes, chewing tobacco, and vaping devices, such as e-cigarettes. If you need help quitting, ask your health care provider. °Contact a health care provider if your symptoms do not improve after 2 weeks. °This information is not intended to replace advice given to you by your health care provider. Make sure you discuss any questions you have with your health care provider. °Document Revised: 01/26/2021 Document Reviewed: 01/26/2021 °Elsevier Patient Education © 2022 [employer] ° °

## 2021-10-12 LAB — CBC WITH DIFFERENTIAL/PLATELET
Basophils Absolute: 0.1 10*3/uL (ref 0.0–0.1)
Basophils Relative: 1.2 % (ref 0.0–3.0)
Eosinophils Absolute: 0.4 10*3/uL (ref 0.0–0.7)
Eosinophils Relative: 7.3 % — ABNORMAL HIGH (ref 0.0–5.0)
HCT: 38.5 % (ref 36.0–46.0)
Hemoglobin: 12.5 g/dL (ref 12.0–15.0)
Lymphocytes Relative: 36.6 % (ref 12.0–46.0)
Lymphs Abs: 2 10*3/uL (ref 0.7–4.0)
MCHC: 32.4 g/dL (ref 30.0–36.0)
MCV: 91.7 fl (ref 78.0–100.0)
Monocytes Absolute: 0.5 10*3/uL (ref 0.1–1.0)
Monocytes Relative: 10.1 % (ref 3.0–12.0)
Neutro Abs: 2.4 10*3/uL (ref 1.4–7.7)
Neutrophils Relative %: 44.8 % (ref 43.0–77.0)
Platelets: 256 10*3/uL (ref 150.0–400.0)
RBC: 4.2 Mil/uL (ref 3.87–5.11)
RDW: 13.7 % (ref 11.5–15.5)
WBC: 5.4 10*3/uL (ref 4.0–10.5)

## 2021-10-12 LAB — LIPID PANEL
Cholesterol: 110 mg/dL (ref 0–200)
HDL: 20.9 mg/dL — ABNORMAL LOW (ref >39.00)
LDL Cholesterol: 54 mg/dL (ref 0–99)
NonHDL: 89.37
Total CHOL/HDL Ratio: 5
Triglycerides: 177 mg/dL — ABNORMAL HIGH (ref 0.0–149.0)
VLDL: 35.4 mg/dL (ref 0.0–40.0)

## 2021-10-12 LAB — COMPREHENSIVE METABOLIC PANEL
ALT: 23 U/L (ref 0–35)
AST: 21 U/L (ref 0–37)
Albumin: 4 g/dL (ref 3.5–5.2)
Alkaline Phosphatase: 53 U/L (ref 39–117)
BUN: 21 mg/dL (ref 6–23)
CO2: 27 mEq/L (ref 19–32)
Calcium: 9.9 mg/dL (ref 8.4–10.5)
Chloride: 106 mEq/L (ref 96–112)
Creatinine, Ser: 1.17 mg/dL (ref 0.40–1.20)
GFR: 52.61 mL/min — ABNORMAL LOW (ref >60.00)
Glucose, Bld: 99 mg/dL (ref 70–99)
Potassium: 4.4 mEq/L (ref 3.5–5.1)
Sodium: 141 mEq/L (ref 135–145)
Total Bilirubin: 0.4 mg/dL (ref 0.2–1.2)
Total Protein: 7 g/dL (ref 6.0–8.3)

## 2021-10-20 ENCOUNTER — Other Ambulatory Visit: Payer: Self-pay | Admitting: Family Medicine

## 2021-12-07 ENCOUNTER — Ambulatory Visit: Payer: Commercial Managed Care - PPO | Admitting: Medical

## 2021-12-07 ENCOUNTER — Telehealth: Payer: Self-pay | Admitting: Family Medicine

## 2021-12-07 ENCOUNTER — Ambulatory Visit: Payer: Commercial Managed Care - PPO | Admitting: Family Medicine

## 2021-12-07 ENCOUNTER — Encounter: Payer: Self-pay | Admitting: Family Medicine

## 2021-12-07 VITALS — BP 105/78 | HR 82 | Temp 98.0°F | Resp 16 | Ht 66.0 in | Wt 142.0 lb

## 2021-12-07 DIAGNOSIS — H8111 Benign paroxysmal vertigo, right ear: Secondary | ICD-10-CM

## 2021-12-07 MED ORDER — ONDANSETRON 4 MG PO TBDP: 4.0000 mg | ORAL_TABLET | Freq: Three times a day (TID) | ORAL | 0 refills | Status: DC | PRN

## 2021-12-07 NOTE — Telephone Encounter (Signed)
Pt states she feels dizzy and nauseous if she moves to her right side, when she lays on her left side she feels fine. She thinks this may be vertigo, although she has never had vertigo before. Put her on the schedule with Percell Miller, but also transferred to triage to go over symptoms with a nurse.  ?

## 2021-12-07 NOTE — Telephone Encounter (Signed)
Called patient she stated she found a ride and is on the way to the office ?

## 2021-12-07 NOTE — Telephone Encounter (Signed)
Please advise 

## 2021-12-07 NOTE — Progress Notes (Signed)
Chief Complaint  ?Patient presents with  ? Dizziness  ?  Here for complains of dizziness  ? ? ?[name] is 56 y.o. pt here for dizziness. ? ?Duration: started today ?Laying on R side starts her s/s's ?Pass out? No ?Spinning? Yes, lasts for around 10 s ?Recent illness/fever? No ?Headache? No ?Neurologic signs? No ?Change in PO intake? No ?Palpitations? No  ? ?Past Medical History:  ?Diagnosis Date  ? Anxiety   ? Breast cancer [facility]) 1999  ? 2004  ? Headache   ? History of exercise stress test   ? GXT 9/19:  Exercised 5'48"; METs: 7; Normal BP response; No ischemic EKG changes  ? Hyperlipidemia   ? Numbness   ? ? ?Family History  ?Problem Relation Age of Onset  ? Stroke Sister 33  ?     after starting OCP  ? Hyperlipidemia Sister   ? Aortic aneurysm Mother   ? Aortic aneurysm Other   ? Hypertension Other   ? Seizures Neg Hx   ? Migraines Neg Hx   ? ? ?BP 105/78 (BP Location: Right Arm, Patient Position: Sitting, Cuff Size: Normal)   Pulse 82   Temp 98 ?F (36.7 ?C) (Oral)   Resp 16   Ht 5\' 6"  (1.676 m)   Wt 142 lb (64.4 kg)   SpO2 95%   BMI 22.92 kg/m?  ?General: Awake, alert, appears stated age ?Eyes: PERRLA, EOMi ?Ears: Patent, TM's neg b/l ?Heart: RRR, no murmurs, no carotid bruits ?Lungs: CTAB, no accessory muscle use ?MSK: 5/5 strength throughout, gait normal ?Neuro: No cerebellar signs, patellar reflex 3/4 b/l wo clonus, calcaneal reflex 1/4 b/l wo clonus, biceps reflex 1/4 b/l wo clonus; Dix-Hall-Pike negative on L, positive on R. ?Psych: Age appropriate judgment and insight, normal mood and affect ? ?Benign paroxysmal positional vertigo of right ear - Plan: ondansetron (ZOFRAN-ODT) 4 MG disintegrating tablet ? ?Zofran prn. Epley Maneuver info provided. YouTube if it doesn't make sense. Vest rehab if needed. Stay hydrated.  ?F/u prn. ?Pt voiced understanding and agreement to the plan. ? ?Shelda Pal, DO ?12/07/21 ?2:59 PM ? ?

## 2021-12-07 NOTE — Telephone Encounter (Signed)
Nurse Line called back in regard to the pts appointment that she has latter on today. She says she has really bad vertigo and asks if her OV can be a VV instead?  She does not feels like she can drive.  ? ?Please advise ?

## 2021-12-07 NOTE — Patient Instructions (Addendum)
Stay hydrated.  ? ?Consider YouTube if these instructions don't make sense.  ? ?Let us know if you need anything. ? ? ?

## 2022-03-18 ENCOUNTER — Other Ambulatory Visit: Payer: Self-pay | Admitting: Family Medicine

## 2022-03-19 ENCOUNTER — Other Ambulatory Visit: Payer: Self-pay | Admitting: Family Medicine

## 2022-05-01 ENCOUNTER — Ambulatory Visit: Payer: Commercial Managed Care - PPO | Admitting: Family Medicine

## 2022-05-01 ENCOUNTER — Encounter: Payer: Self-pay | Admitting: Family Medicine

## 2022-05-01 VITALS — BP 112/80 | HR 76 | Temp 98.1°F | Resp 18 | Ht 66.0 in | Wt 145.8 lb

## 2022-05-01 DIAGNOSIS — E785 Hyperlipidemia, unspecified: Secondary | ICD-10-CM

## 2022-05-01 DIAGNOSIS — G4700 Insomnia, unspecified: Secondary | ICD-10-CM

## 2022-05-01 DIAGNOSIS — I890 Lymphedema, not elsewhere classified: Secondary | ICD-10-CM

## 2022-05-01 MED ORDER — ALPRAZOLAM 0.25 MG PO TABS: 0.2500 mg | ORAL_TABLET | Freq: Two times a day (BID) | ORAL | 0 refills | Status: DC | PRN

## 2022-05-01 NOTE — Patient Instructions (Signed)

## 2022-05-01 NOTE — Progress Notes (Signed)
Subjective:   By signing my name below, I, [name], attest that this documentation has been prepared under the direction and in the presence of [name] DO 05/01/2022   Patient ID: [name], female    DOB: 1965/11/26, 56 y.o.   MRN: [id number]  Chief Complaint  Patient presents with   Hyperlipidemia   Follow-up    Hyperlipidemia Pertinent negatives include no chest pain, myalgias or shortness of breath.   Patient is in today for an office visit.  She states that she has some familial stressors. Her sister was diagnosed with Stage 1 Cancer. She states that other than that, she is feeling fine but her nerves are shocked and she is stressed. She is also not sleeping regularly.   She is requesting a sleeve for her left arm for her lymphedema. She is interested in being referred to by a specialist.   Past Medical History:  Diagnosis Date   Anxiety    Breast cancer [facility]) 1999   2004   Headache    History of exercise stress test    GXT 9/19:  Exercised 5'48"; METs: 7; Normal BP response; No ischemic EKG changes   Hyperlipidemia    Numbness     Past Surgical History:  Procedure Laterality Date   ABDOMINAL HYSTERECTOMY  2006   TAH,BSO secondary bleeding and breast cancer--   BREAST SURGERY     CORNEAL TRANSPLANT Bilateral    6x, started in 1999, last was 2006.   EYE SURGERY     MASTECTOMY     bilateral---1999/2004    Family History  Problem Relation Age of Onset   Stroke Sister 46       after starting OCP   Hyperlipidemia Sister    Aortic aneurysm Mother    Aortic aneurysm Other    Hypertension Other    Seizures Neg Hx    Migraines Neg Hx     Social History   Socioeconomic History   Marital status: Single    Spouse name: Not on file   Number of children: 1   Years of education: 16   Highest education level: Not on file  Occupational History   Occupation: works for [employer]; office specialist  Tobacco Use   Smoking  status: Some Days    Packs/day: 0.25    Years: 27.00    Total pack years: 6.75    Types: Cigarettes   Smokeless tobacco: Never   Tobacco comments:    Trying to quit  Vaping Use   Vaping Use: Never used  Substance and Sexual Activity   Alcohol use: Yes    Alcohol/week: 0.0 standard drinks of alcohol    Comment: Rare   Drug use: No   Sexual activity: Not Currently  Other Topics Concern   Not on file  Social History Narrative   Marital status: single      Children: one daughter      Lives: with sister      Employment: works for [employer]; office specialist      Education: Multimedia programmer      Tobacco: trying to quit      Alcohol: none; rare      Drugs: none      Exercise: walking daily 2 miles      Caffeine use: Tea/pepsi- 1 pepsi/morning      Tea- 3 cups per day   Right-handed   Social Determinants of Health   Financial  Resource Strain: Not on file  Food Insecurity: Not on file  Transportation Needs: Not on file  Physical Activity: Not on file  Stress: Not on file  Social Connections: Not on file  Intimate Partner Violence: Not on file    Outpatient Medications Prior to Visit  Medication Sig Dispense Refill   acyclovir (ZOVIRAX) 400 MG tablet TAKE 1 TABLET(400 MG) BY MOUTH TWICE DAILY 180 tablet 0   aspirin 81 MG tablet Take 1 tablet (81 mg total) by mouth daily. 30 tablet    atorvastatin (LIPITOR) 10 MG tablet TAKE 1 TABLET BY MOUTH DAILY AT 6 PM 90 tablet 0   fenofibrate 160 MG tablet TAKE 1 TABLET(160 MG) BY MOUTH DAILY 90 tablet 1   metoprolol succinate (TOPROL-XL) 50 MG 24 hr tablet TAKE 1 TABLET(50 MG) BY MOUTH DAILY 90 tablet 1   ondansetron (ZOFRAN-ODT) 4 MG disintegrating tablet Take 1 tablet (4 mg total) by mouth every 8 (eight) hours as needed for nausea or vomiting. 20 tablet 0   ALPRAZolam (XANAX) 0.25 MG tablet Take 1 tablet (0.25 mg total) by mouth 2 (two) times daily as needed. for anxiety 15 tablet 0   azithromycin  (ZITHROMAX Z-PAK) 250 MG tablet As directed 6 each 0   predniSONE (DELTASONE) 10 MG tablet TAKE 3 TABLETS PO QD FOR 3 DAYS THEN TAKE 2 TABLETS PO QD FOR 3 DAYS THEN TAKE 1 TABLET PO QD FOR 3 DAYS THEN TAKE 1/2 TAB PO QD FOR 3 DAYS 20 tablet 0   promethazine-dextromethorphan (PROMETHAZINE-DM) 6.25-15 MG/5ML syrup Take 5 mLs by mouth 4 (four) times daily as needed. 118 mL 0   No facility-administered medications prior to visit.    Allergies  Allergen Reactions   Penicillins Other (See Comments)    Childhood reaction    Review of Systems  Constitutional:  Negative for chills, fever and malaise/fatigue.  HENT:  Negative for congestion and hearing loss.   Eyes:  Negative for discharge.  Respiratory:  Negative for cough, sputum production and shortness of breath.   Cardiovascular:  Negative for chest pain, palpitations and leg swelling.  Gastrointestinal:  Negative for abdominal pain, blood in stool, constipation, diarrhea, heartburn, nausea and vomiting.  Genitourinary:  Negative for dysuria, frequency, hematuria and urgency.  Musculoskeletal:  Negative for back pain, falls and myalgias.  Skin:  Negative for rash.  Neurological:  Negative for dizziness, sensory change, loss of consciousness, weakness and headaches.  Endo/Heme/Allergies:  Negative for environmental allergies. Does not bruise/bleed easily.  Psychiatric/Behavioral:  Negative for depression and suicidal ideas. The patient is not nervous/anxious and does not have insomnia.        Objective:    Physical Exam Vitals and nursing note reviewed.  Constitutional:      General: She is not in acute distress.    Appearance: Normal appearance. She is not ill-appearing.  HENT:     Head: Normocephalic and atraumatic.     Right Ear: External ear normal.     Left Ear: External ear normal.  Eyes:     Extraocular Movements: Extraocular movements intact.     Pupils: Pupils are equal, round, and reactive to light.  Skin:    General:  Skin is warm and dry.  Neurological:     Mental Status: She is alert and oriented to person, place, and time.  Psychiatric:        Judgment: Judgment normal.     BP 112/80 (BP Location: Right Arm, Patient Position: Sitting, Cuff Size: Normal)  Pulse 76   Temp 98.1 F (36.7 C) (Oral)   Resp 18   Ht '5\' 6"'$  (1.676 m)   Wt 145 lb 12.8 oz (66.1 kg)   SpO2 97%   BMI 23.53 kg/m  Wt Readings from Last 3 Encounters:  05/01/22 145 lb 12.8 oz (66.1 kg)  12/07/21 142 lb (64.4 kg)  10/11/21 146 lb 12.8 oz (66.6 kg)    Diabetic Foot Exam - Simple   No data filed    Lab Results  Component Value Date   WBC 5.4 10/11/2021   HGB 12.5 10/11/2021   HCT 38.5 10/11/2021   PLT 256.0 10/11/2021   GLUCOSE 99 10/11/2021   CHOL 110 10/11/2021   TRIG 177.0 (H) 10/11/2021   HDL 20.90 (L) 10/11/2021   LDLDIRECT 90.0 03/17/2020   LDLCALC 54 10/11/2021   ALT 23 10/11/2021   AST 21 10/11/2021   NA 141 10/11/2021   K 4.4 10/11/2021   CL 106 10/11/2021   CREATININE 1.17 10/11/2021   BUN 21 10/11/2021   CO2 27 10/11/2021   TSH 1.15 03/25/2021   HGBA1C 5.6 12/02/2013   MICROALBUR <0.7 01/24/2016    Lab Results  Component Value Date   TSH 1.15 03/25/2021   Lab Results  Component Value Date   WBC 5.4 10/11/2021   HGB 12.5 10/11/2021   HCT 38.5 10/11/2021   MCV 91.7 10/11/2021   PLT 256.0 10/11/2021   Lab Results  Component Value Date   NA 141 10/11/2021   K 4.4 10/11/2021   CO2 27 10/11/2021   GLUCOSE 99 10/11/2021   BUN 21 10/11/2021   CREATININE 1.17 10/11/2021   BILITOT 0.4 10/11/2021   ALKPHOS 53 10/11/2021   AST 21 10/11/2021   ALT 23 10/11/2021   PROT 7.0 10/11/2021   ALBUMIN 4.0 10/11/2021   CALCIUM 9.9 10/11/2021   ANIONGAP 11 10/31/2015   GFR 52.61 (L) 10/11/2021   Lab Results  Component Value Date   CHOL 110 10/11/2021   Lab Results  Component Value Date   HDL 20.90 (L) 10/11/2021   Lab Results  Component Value Date   LDLCALC 54 10/11/2021   Lab  Results  Component Value Date   TRIG 177.0 (H) 10/11/2021   Lab Results  Component Value Date   CHOLHDL 5 10/11/2021   Lab Results  Component Value Date   HGBA1C 5.6 12/02/2013       Assessment & Plan:   Problem List Items Addressed This Visit       Unprioritized   Lymphedema - Primary    Refer to PT for lymphedema      Relevant Orders   Ambulatory referral to Physical Therapy   Hyperlipidemia    Tolerating statin, encouraged heart healthy diet, avoid trans fats, minimize simple carbs and saturated fats. Increase exercise as tolerated      Relevant Orders   Comprehensive metabolic panel   Lipid panel   Other Visit Diagnoses     Insomnia, unspecified type       Relevant Medications   ALPRAZolam (XANAX) 0.25 MG tablet       Meds ordered this encounter  Medications   ALPRAZolam (XANAX) 0.25 MG tablet    Sig: Take 1 tablet (0.25 mg total) by mouth 2 (two) times daily as needed. for anxiety    Dispense:  15 tablet    Refill:  0    I, [name], DO, personally preformed the services described in this documentation.  All medical  record entries made by the scribe were at my direction and in my presence.  I have reviewed the chart and discharge instructions (if applicable) and agree that the record reflects my personal performance and is accurate and complete. 05/01/2022   I,Amber Collins,acting as a scribe for [employer], DO.,have documented all relevant documentation on the behalf of [name], DO,as directed by  [name], DO while in the presence of [name], DO.    [name], DO

## 2022-05-01 NOTE — Assessment & Plan Note (Signed)
Refer to PT for lymphedema

## 2022-05-01 NOTE — Assessment & Plan Note (Signed)
Tolerating statin, encouraged heart healthy diet, avoid trans fats, minimize simple carbs and saturated fats. Increase exercise as tolerated 

## 2022-05-04 ENCOUNTER — Telehealth: Payer: Self-pay | Admitting: Family Medicine

## 2022-05-04 NOTE — Telephone Encounter (Signed)
Izora Gala [facility]) called asking for clarification on a referral sent to them for this pt. She would like to know if this pt has cancer or not.

## 2022-05-05 NOTE — Telephone Encounter (Signed)
Returned call and advised that pt has hx of breast cancer

## 2022-05-05 NOTE — Telephone Encounter (Signed)
Please advise. The last referral I see was for PT on 07/24.

## 2022-05-11 ENCOUNTER — Encounter: Payer: Self-pay | Admitting: Rehabilitation

## 2022-05-11 ENCOUNTER — Ambulatory Visit: Payer: Commercial Managed Care - PPO | Attending: Family Medicine | Admitting: Rehabilitation

## 2022-05-11 ENCOUNTER — Other Ambulatory Visit: Payer: Self-pay

## 2022-05-11 DIAGNOSIS — I890 Lymphedema, not elsewhere classified: Secondary | ICD-10-CM | POA: Diagnosis not present

## 2022-05-11 DIAGNOSIS — I972 Postmastectomy lymphedema syndrome: Secondary | ICD-10-CM | POA: Diagnosis present

## 2022-05-11 NOTE — Therapy (Signed)
OUTPATIENT PHYSICAL THERAPY ONCOLOGY EVALUATION  Patient Name: [name] MRN: [id number] DOB:1966-08-13, 56 y.o., female Today's Date: 05/11/2022   PT End of Session - 05/11/22 1049     Visit Number 1    Number of Visits 1    PT Start Time 1004    PT Stop Time 1025    PT Time Calculation (min) 45 min    Activity Tolerance Patient tolerated treatment well    Behavior During Therapy [facility] for tasks assessed/performed             Past Medical History:  Diagnosis Date   Anxiety    Breast cancer ([place]) 1999   2004   Headache    History of exercise stress test    GXT 9/19:  Exercised 5'48"; METs: 7; Normal BP response; No ischemic EKG changes   Hyperlipidemia    Numbness    Past Surgical History:  Procedure Laterality Date   ABDOMINAL HYSTERECTOMY  2006   TAH,BSO secondary bleeding and breast cancer--   BREAST SURGERY     CORNEAL TRANSPLANT Bilateral    6x, started in 1999, last was 2006.   EYE SURGERY     MASTECTOMY     bilateral---1999/2004   Patient Active Problem List   Diagnosis Date Noted   Lymphedema 05/01/2022   Bronchitis 10/11/2021   Preventative health care 03/25/2021   HTN (hypertension) 02/05/2019   Chronic nonintractable headache 10/15/2018   Hyperlipidemia 10/15/2018   Osteochondral defect of talus 06/11/2018   Lightheadedness 08/14/2016   Enlarged pituitary gland ([place]) 08/08/2016   Generalized anxiety disorder 11/02/2015   Facial droop 12/01/2013   Sensory disturbance 12/01/2013   Trichomonas contact, treated 08/05/2013   UTI 11/22/2010   FLANK PAIN, LEFT 11/22/2010   TOBACCO USE 10/11/2010   BREAST CANCER, HX OF 10/11/2010    PCP: Dr. Roma Schanz  REFERRING PROVIDER: Dr. Carollee Herter  REFERRING DIAG: lymphedema  THERAPY DIAG:  Post-mastectomy lymphedema syndrome  ONSET DATE: 1999  Rationale for Evaluation and Treatment Rehabilitation  SUBJECTIVE                                                                                                                                                                                            SUBJECTIVE STATEMENT: I haven't had any daytime compression in a few months.    PERTINENT HISTORY:  1999 Left mastectomy with unknown lymph nodes removed but pt thinks a lot with no radiation and did no chemotherapy and then recurrence in 2004 with Rt mastectomy with only a few lymph nodes removed with no radiation but chemotherapy again.  It started swelling  right away in 1999.  Has had PT and different sleeves and does have a night time sleeve.  Has been educated on MLD.   PAIN:  Are you having pain? No  PRECAUTIONS: Left UE lymphedema   WEIGHT BEARING RESTRICTIONS No  FALLS:  Has patient fallen in last 6 months? No  LIVING ENVIRONMENT: Lives with: lives with their family Lives in: House/apartment  OCCUPATION: works for [employer]; office specialist  LEISURE: walking daily 2 miles  HAND DOMINANCE : right   PRIOR LEVEL OF FUNCTION: Independent  PATIENT GOALS get a new sleeve   OBJECTIVE  COGNITION:  Overall cognitive status: Within functional limits for tasks assessed   PALPATION: Firm edema Lt UE  OBSERVATIONS / OTHER ASSESSMENTS: marks left from chair pressure on back of arm  TODAY'S TREATMENT  Measured pt for exostrong size small sleeve and medium glove with ordering information given to patient as [employer] is not covered by DME at this time. Also sending demo to patient for pump trial as she likes MLD but has a hard time doing it ind Reviewed MLD of the UE as pt is sliding the skin more than stretching.  But overall pt is performing the proximal clearance very well.   PATIENT EDUCATION:  Education details: per today's note Person educated: Patient Education method: Consulting civil engineer, Demonstration, Tactile cues, Verbal cues, and Handouts Education comprehension: verbalized understanding, returned demonstration, verbal cues required, and tactile cues  required  ASSESSMENT: CLINICAL IMPRESSION: Patient is a 56 y.o. female who was seen today for physical therapy evaluation and treatment for her chronic Left UE lymphedema since mastectomy and ALND in 1999. Pt has a night sleeve and needs a day sleeve.  Also interested in a pump trial and to see what coverage would be.    OBJECTIVE IMPAIRMENTS increased edema.   ACTIVITY LIMITATIONS none  PARTICIPATION LIMITATIONS: none  PERSONAL FACTORS Time since onset of injury/illness/exacerbation and 1-2 comorbidities: radiation and ALND  are also affecting patient's functional outcome.   REHAB POTENTIAL: Excellent  CLINICAL DECISION MAKING: Stable/uncomplicated  EVALUATION COMPLEXITY: Low  GOALS: Goals reviewed with patient? Yes  SHORT TERM GOALS: Target date: 05/28/22  Pt will obtain new compression garment for the Lt UE Baseline: Goal status: INITIAL  PLAN: PT FREQUENCY: 1-2 visits  PT DURATION: over 4 weeks  PLANNED INTERVENTIONS: Patient/Family education, Self Care, Orthotic/Fit training, Prosthetic training, DME instructions, Manual therapy, and Re-evaluation  PLAN FOR NEXT SESSION: assess sleeve fit   Stark Bray, PT 05/11/2022, 10:54 AM

## 2022-05-24 ENCOUNTER — Other Ambulatory Visit: Payer: Self-pay | Admitting: Family Medicine

## 2022-06-25 ENCOUNTER — Other Ambulatory Visit: Payer: Self-pay | Admitting: Family Medicine

## 2022-09-23 ENCOUNTER — Other Ambulatory Visit: Payer: Self-pay | Admitting: Family Medicine

## 2022-11-22 ENCOUNTER — Other Ambulatory Visit: Payer: Self-pay | Admitting: Family Medicine

## 2022-12-13 ENCOUNTER — Telehealth: Payer: Self-pay | Admitting: Family Medicine

## 2022-12-13 NOTE — Telephone Encounter (Signed)
Call Type Triage / Clinical Relationship To Patient Self Return Phone Number [phone] (Primary) Chief Complaint Blood Pressure High Reason for Call Symptomatic / Request for Health Information Initial Comment Caller states that she is on metropolol and last night was feeling dizzy. Caller states she took her bp this morning and Bp was150/94 and when she took it again it was 137/91. Translation No Nurse Assessment Nurse: Zorita Pang, RN, Deborah Date/Time (Eastern Time): 12/13/2022 10:06:45 AM Confirm and document reason for call. If symptomatic, describe symptoms. ---Caller states that she takes metropolol 50 mg every day at 6. Felt dizzy and light headed last night. She went to bed and slept well. Also is stressful at times. BP was 150/94 and now is 137/91 which is high for her. The lightheadedness and dizzy is intermittent.  Final Disposition 12/13/2022 10:21:48 AM See PCP within 24 Hours Yes W[place], RN, [employer]

## 2022-12-13 NOTE — Telephone Encounter (Signed)
Pt said she felt lightheaded yesterday and she took her blood pressure pill and metoprolol at her normal time. Pt said she woke up this morning still feeling a little lightheaded and she went to work and took her blood pressure and it's 150/94 which is high. Pt wasn't sure if she should take her blood pressure medicine now or wait til her usual time or come in to be seen. Sent to triage to be further evaluated.

## 2022-12-13 NOTE — Telephone Encounter (Signed)
Appt was made for tomorrow with provider

## 2022-12-14 ENCOUNTER — Encounter: Payer: Self-pay | Admitting: Family Medicine

## 2022-12-14 ENCOUNTER — Ambulatory Visit: Payer: Commercial Managed Care - PPO | Admitting: Family Medicine

## 2022-12-14 VITALS — BP 120/75 | HR 80 | Resp 18 | Ht 66.0 in | Wt 142.0 lb

## 2022-12-14 DIAGNOSIS — F411 Generalized anxiety disorder: Secondary | ICD-10-CM

## 2022-12-14 DIAGNOSIS — R42 Dizziness and giddiness: Secondary | ICD-10-CM

## 2022-12-14 LAB — CBC WITH DIFFERENTIAL/PLATELET
Basophils Absolute: 0 10*3/uL (ref 0.0–0.1)
Basophils Relative: 0.4 % (ref 0.0–3.0)
Eosinophils Absolute: 0.3 10*3/uL (ref 0.0–0.7)
Eosinophils Relative: 6 % — ABNORMAL HIGH (ref 0.0–5.0)
HCT: 38.9 % (ref 36.0–46.0)
Hemoglobin: 13 g/dL (ref 12.0–15.0)
Lymphocytes Relative: 32.4 % (ref 12.0–46.0)
Lymphs Abs: 1.5 10*3/uL (ref 0.7–4.0)
MCHC: 33.4 g/dL (ref 30.0–36.0)
MCV: 90.2 fl (ref 78.0–100.0)
Monocytes Absolute: 0.5 10*3/uL (ref 0.1–1.0)
Monocytes Relative: 10.7 % (ref 3.0–12.0)
Neutro Abs: 2.3 10*3/uL (ref 1.4–7.7)
Neutrophils Relative %: 50.5 % (ref 43.0–77.0)
Platelets: 313 10*3/uL (ref 150.0–400.0)
RBC: 4.31 Mil/uL (ref 3.87–5.11)
RDW: 13.9 % (ref 11.5–15.5)
WBC: 4.6 10*3/uL (ref 4.0–10.5)

## 2022-12-14 LAB — COMPREHENSIVE METABOLIC PANEL
ALT: 22 U/L (ref 0–35)
AST: 25 U/L (ref 0–37)
Albumin: 4.1 g/dL (ref 3.5–5.2)
Alkaline Phosphatase: 48 U/L (ref 39–117)
BUN: 18 mg/dL (ref 6–23)
CO2: 29 mEq/L (ref 19–32)
Calcium: 10.5 mg/dL (ref 8.4–10.5)
Chloride: 104 mEq/L (ref 96–112)
Creatinine, Ser: 1.06 mg/dL (ref 0.40–1.20)
GFR: 58.74 mL/min — ABNORMAL LOW (ref >60.00)
Glucose, Bld: 93 mg/dL (ref 70–99)
Potassium: 5.2 mEq/L — ABNORMAL HIGH (ref 3.5–5.1)
Sodium: 139 mEq/L (ref 135–145)
Total Bilirubin: 0.5 mg/dL (ref 0.2–1.2)
Total Protein: 7.4 g/dL (ref 6.0–8.3)

## 2022-12-14 LAB — TSH: TSH: 0.84 u[IU]/mL (ref 0.35–5.50)

## 2022-12-14 LAB — TROPONIN I (HIGH SENSITIVITY): High Sens Troponin I: 3 ng/L (ref 2–17)

## 2022-12-14 MED ORDER — ALPRAZOLAM 0.25 MG PO TABS: 0.2500 mg | ORAL_TABLET | Freq: Two times a day (BID) | ORAL | 0 refills | Status: DC | PRN

## 2022-12-14 NOTE — Progress Notes (Signed)
Acute Office Visit  Subjective:     Patient ID: [name], female    DOB: Jan 27, 1966, 57 y.o.   MRN: [id number]  Chief Complaint  Patient presents with   Dizziness    Lightheadedness -- onset: 2 days      Patient states she felt lightheaded Tuesday night (2 days ago) while cooking dinner. States she did not feel dizzy or have any spinning sensation, just a little lightheaded. Reports she felt some generalized "tingling" that was more noticeable on her left side - reports this is not unusual for her (ever since L breast cancer and surgeries 20+ years ago, whenever she gets sick/stressed, she notices some tingling on her left side, no other associated symptoms). Reports that when the symptoms began, she sat down, drank some water, took her evening blood pressure meds and started feeling better. States she was then very anxious because her sister as had as stroke, so she was checking her symptoms all evening. States earlier that day she had been very busy and was tired. Two days prior (Sunday) she decided to quite smoking and caffeine cold [place]. She went to the pharmacy and got "the strongest nicotine patch they had" and is wondering if that was too much as she only smoke <8 cigarettes per day. She took it off when she started feeling poorly. Yesterday she did not have any symptoms at all, but the again, around 5pm she started to have the tingly sensation again, but it only lasted about 10 minutes and has not returned. States she doesn't notice any symptoms if she keeps herself busy. Patient denies any chest pain, palpitations, dyspnea, wheezing, edema, recurrent headaches, vision changes. Reports she gets stressed easily, especially thinking about her healthy given family history of CVA. She will use occasional Xanax from PCP, but ran out. She has never been on daily medications for anxiety.        12/14/2022    8:44 AM 05/01/2022    8:23 AM 03/25/2021    2:13 PM  PHQ9 SCORE ONLY  PHQ-9  Total Score 1 0 0      12/14/2022    8:44 AM  GAD 7 : Generalized Anxiety Score  Nervous, Anxious, on Edge 1  Control/stop worrying 1  Worry too much - different things 1  Afraid - awful might happen 1  Anxiety Difficulty Not difficult at all      All review of systems negative except what is listed in the HPI      Objective:    BP 120/75   Pulse 80   Resp 18   Ht '5\' 6"'$  (1.676 m)   Wt 142 lb (64.4 kg)   SpO2 100%   BMI 22.92 kg/m    Physical Exam Vitals reviewed.  Constitutional:      Appearance: Normal appearance.  HENT:     Head: Normocephalic and atraumatic.     Mouth/Throat:     Mouth: Mucous membranes are moist.     Pharynx: Oropharynx is clear.  Eyes:     Extraocular Movements: Extraocular movements intact.     Conjunctiva/sclera: Conjunctivae normal.     Pupils: Pupils are equal, round, and reactive to light.  Cardiovascular:     Rate and Rhythm: Normal rate and regular rhythm.     Pulses: Normal pulses.     Heart sounds: Normal heart sounds.  Pulmonary:     Effort: Pulmonary effort is normal.     Breath sounds: Normal breath sounds.  Musculoskeletal:     Cervical back: Normal range of motion and neck supple. No tenderness.  Lymphadenopathy:     Cervical: No cervical adenopathy.  Skin:    General: Skin is warm and dry.  Neurological:     General: No focal deficit present.     Mental Status: She is alert and oriented to person, place, and time. Mental status is at baseline.     Cranial Nerves: No cranial nerve deficit.     Sensory: No sensory deficit.     Motor: No weakness.     Coordination: Coordination normal.     Gait: Gait normal.  Psychiatric:        Mood and Affect: Mood normal.        Behavior: Behavior normal.        Thought Content: Thought content normal.        Judgment: Judgment normal.     No results found for any visits on 12/14/22.      Assessment & Plan:   Problem List Items Addressed This Visit       Other    Generalized anxiety disorder Patient requesting refill on PRN Xanax (uses rarely). States she has been under a lot of stress lately. Not interested in daily meds at this time. No SI/HI. PDMP reviewed.     Relevant Medications   ALPRAZolam (XANAX) 0.25 MG tablet   Lightheadedness - Primary EKG NSR with diffuse nonspecific T wave changes, similar to prior EKG in 2020. VSS. Normal exam. Labs today. Discussed stress management, staying well hydrated, taking meds as prescribed. Recommend trying the lower/middle dose of nicotine patch instead of high dose - she does feel like some of her symptoms may be from cold-turkey cessation of smoking and caffeine earlier this week. Patient aware of signs/symptoms requiring further/urgent evaluation. Will update her with labs results and plan.     Relevant Orders   EKG 12-Lead (Completed)   CBC with Differential/Platelet   Comprehensive metabolic panel   TSH   Troponin I (High Sensitivity)      Meds ordered this encounter  Medications   ALPRAZolam (XANAX) 0.25 MG tablet    Sig: Take 1 tablet (0.25 mg total) by mouth 2 (two) times daily as needed. for anxiety    Dispense:  15 tablet    Refill:  0    Order Specific Question:   Supervising Provider    Answer:   Penni Homans A [[id number]]    Return if symptoms worsen or fail to improve. Schedule CPE with PCP.   Terrilyn Saver, NP

## 2022-12-14 NOTE — Patient Instructions (Addendum)
EKG stable, very similar to last EKG in 2020 Vitals stable today No alarm findings on exam  Let's get some labs to ensure no underlying problems We discussed symptoms to monitor for Make sure you are staying well hydrated and getting adequate rest  Try the smaller dose of nicotine patch

## 2022-12-22 ENCOUNTER — Other Ambulatory Visit: Payer: Self-pay | Admitting: *Deleted

## 2022-12-22 MED ORDER — METOPROLOL SUCCINATE ER 50 MG PO TB24: 50.0000 mg | ORAL_TABLET | Freq: Every day | ORAL | 0 refills | Status: DC

## 2022-12-22 MED ORDER — ATORVASTATIN CALCIUM 10 MG PO TABS: 10.0000 mg | ORAL_TABLET | Freq: Every day | ORAL | 0 refills | Status: DC

## 2023-02-01 ENCOUNTER — Ambulatory Visit (INDEPENDENT_AMBULATORY_CARE_PROVIDER_SITE_OTHER): Payer: Commercial Managed Care - PPO | Admitting: Family Medicine

## 2023-02-01 ENCOUNTER — Encounter: Payer: Self-pay | Admitting: Family Medicine

## 2023-02-01 VITALS — BP 108/80 | HR 76 | Temp 97.8°F | Resp 18 | Ht 66.0 in | Wt 144.2 lb

## 2023-02-01 DIAGNOSIS — Z0000 Encounter for general adult medical examination without abnormal findings: Secondary | ICD-10-CM

## 2023-02-01 DIAGNOSIS — Z1211 Encounter for screening for malignant neoplasm of colon: Secondary | ICD-10-CM

## 2023-02-01 DIAGNOSIS — I10 Essential (primary) hypertension: Secondary | ICD-10-CM

## 2023-02-01 DIAGNOSIS — Z79899 Other long term (current) drug therapy: Secondary | ICD-10-CM | POA: Diagnosis not present

## 2023-02-01 DIAGNOSIS — E785 Hyperlipidemia, unspecified: Secondary | ICD-10-CM

## 2023-02-01 DIAGNOSIS — F411 Generalized anxiety disorder: Secondary | ICD-10-CM | POA: Diagnosis not present

## 2023-02-01 DIAGNOSIS — A6000 Herpesviral infection of urogenital system, unspecified: Secondary | ICD-10-CM

## 2023-02-01 DIAGNOSIS — Z23 Encounter for immunization: Secondary | ICD-10-CM

## 2023-02-01 LAB — CBC WITH DIFFERENTIAL/PLATELET
Basophils Absolute: 0.1 10*3/uL (ref 0.0–0.1)
Basophils Relative: 1.1 % (ref 0.0–3.0)
Eosinophils Absolute: 0.5 10*3/uL (ref 0.0–0.7)
Eosinophils Relative: 7.8 % — ABNORMAL HIGH (ref 0.0–5.0)
HCT: 37.9 % (ref 36.0–46.0)
Hemoglobin: 12.6 g/dL (ref 12.0–15.0)
Lymphocytes Relative: 36.9 % (ref 12.0–46.0)
Lymphs Abs: 2.3 10*3/uL (ref 0.7–4.0)
MCHC: 33.4 g/dL (ref 30.0–36.0)
MCV: 89.9 fl (ref 78.0–100.0)
Monocytes Absolute: 0.6 10*3/uL (ref 0.1–1.0)
Monocytes Relative: 10.1 % (ref 3.0–12.0)
Neutro Abs: 2.8 10*3/uL (ref 1.4–7.7)
Neutrophils Relative %: 44.1 % (ref 43.0–77.0)
Platelets: 321 10*3/uL (ref 150.0–400.0)
RBC: 4.21 Mil/uL (ref 3.87–5.11)
RDW: 13.4 % (ref 11.5–15.5)
WBC: 6.3 10*3/uL (ref 4.0–10.5)

## 2023-02-01 LAB — LIPID PANEL
Cholesterol: 115 mg/dL (ref 0–200)
HDL: 33.8 mg/dL — ABNORMAL LOW (ref >39.00)
LDL Cholesterol: 65 mg/dL (ref 0–99)
NonHDL: 81.22
Total CHOL/HDL Ratio: 3
Triglycerides: 80 mg/dL (ref 0.0–149.0)
VLDL: 16 mg/dL (ref 0.0–40.0)

## 2023-02-01 LAB — COMPREHENSIVE METABOLIC PANEL
ALT: 19 U/L (ref 0–35)
AST: 23 U/L (ref 0–37)
Albumin: 4.3 g/dL (ref 3.5–5.2)
Alkaline Phosphatase: 53 U/L (ref 39–117)
BUN: 21 mg/dL (ref 6–23)
CO2: 28 mEq/L (ref 19–32)
Calcium: 9.6 mg/dL (ref 8.4–10.5)
Chloride: 102 mEq/L (ref 96–112)
Creatinine, Ser: 1.11 mg/dL (ref 0.40–1.20)
GFR: 55.53 mL/min — ABNORMAL LOW (ref >60.00)
Glucose, Bld: 72 mg/dL (ref 70–99)
Potassium: 5 mEq/L (ref 3.5–5.1)
Sodium: 137 mEq/L (ref 135–145)
Total Bilirubin: 0.3 mg/dL (ref 0.2–1.2)
Total Protein: 7.6 g/dL (ref 6.0–8.3)

## 2023-02-01 LAB — TSH: TSH: 0.74 u[IU]/mL (ref 0.35–5.50)

## 2023-02-01 MED ORDER — ALPRAZOLAM 0.25 MG PO TABS: 0.2500 mg | ORAL_TABLET | Freq: Two times a day (BID) | ORAL | 0 refills | Status: DC | PRN

## 2023-02-01 MED ORDER — ESCITALOPRAM OXALATE 10 MG PO TABS: 10.0000 mg | ORAL_TABLET | Freq: Every day | ORAL | 2 refills | Status: AC

## 2023-02-01 MED ORDER — ACYCLOVIR 400 MG PO TABS: ORAL_TABLET | ORAL | 0 refills | Status: DC

## 2023-02-01 NOTE — Assessment & Plan Note (Signed)
Ghm utd Check labs  Health Maintenance  Topic Date Due   COLONOSCOPY (Pts 45-66yrs Insurance coverage will need to be confirmed)  Never done   COVID-19 Vaccine (3 - Pfizer risk series) 02/13/2020   INFLUENZA VACCINE  05/10/2023   DTaP/Tdap/Td (3 - Td or Tdap) 01/31/2033   Hepatitis C Screening  Completed   HIV Screening  Completed   Zoster Vaccines- Shingrix  Completed   HPV VACCINES  Aged Out

## 2023-02-01 NOTE — Progress Notes (Addendum)
Subjective:   By signing my name below, I, [name], attest that this documentation has been prepared under the direction and in the presence of [name], DO. 02/01/2023   Patient ID: [name], female    DOB: 07-05-1966, 57 y.o.   MRN: [id number]  Chief Complaint  Patient presents with   Annual Exam    Pt states fasting     HPI Patient is in today for a comprehensive physical exam.   She is requesting a refill for 400 mg acyclovir and 0.25 mg xanax.  She reports having stress due to her family events causing her to smoke 2 cigarettes recently. She continues taking 0.25 mg Xanax as needed to help manage her anxiety. She is trying to stop smoking. She was smoking a pack of cigarettes every week for 36 years.  She has no recent dizziness flare ups but has had episodes of lightheadedness. She has not followed up with her cardiologist recently.  She denies fever, new moles, congestion, sinus pain, sore throat, chest pain, palpitations, cough, shortness of breath, wheezing, nausea, vomiting, abdominal pain, diarrhea, constipation, dysuria, frequency, hematuria, new muscle pain, new joint pain, or headaches at this time.  She is interested in receiving the tetanus vaccine.  Bone density was last completed 06/02/2021. Results showed she is osteopenic. Repeat in 2 years.  Never completed a colonoscopy. She is willing to completed colonoscopy but does not want to know the results whether they are normal or not.   Past Medical History:  Diagnosis Date   Anxiety    Breast cancer 1999   2004   Headache    History of exercise stress test    GXT 9/19:  Exercised 5'48"; METs: 7; Normal BP response; No ischemic EKG changes   Hyperlipidemia    Numbness    Tobacco abuse     Past Surgical History:  Procedure Laterality Date   ABDOMINAL HYSTERECTOMY  2006   TAH,BSO secondary bleeding and breast cancer--   BREAST SURGERY     CORNEAL TRANSPLANT Bilateral    6x, started in  1999, last was 2006.   EYE SURGERY     MASTECTOMY     bilateral---1999/2004    Family History  Problem Relation Age of Onset   Aortic aneurysm Mother    Stroke Sister 56       after starting OCP   Breast cancer Sister    Diabetes Sister    Hyperlipidemia Sister    Diabetes Mellitus I Sister    Multiple sclerosis Nephew    Aortic aneurysm Other    Hypertension Other    Seizures Neg Hx    Migraines Neg Hx     Social History   Socioeconomic History   Marital status: Single    Spouse name: Not on file   Number of children: 1   Years of education: 16   Highest education level: Not on file  Occupational History   Occupation: works for [employer]; office specialist  Tobacco Use   Smoking status: Some Days    Packs/day: 0.25    Years: 27.00    Additional pack years: 0.00    Total pack years: 6.75    Types: Cigarettes   Smokeless tobacco: Never   Tobacco comments:    Trying to quit  Vaping Use   Vaping Use: Never used  Substance and Sexual Activity   Alcohol use: Yes    Alcohol/week: 0.0 standard drinks of alcohol  Comment: Rare   Drug use: No   Sexual activity: Not Currently  Other Topics Concern   Not on file  Social History Narrative   Marital status: single      Children: one daughter      Lives: with sister      Employment: works for [employer]; office specialist      Education: Pharmacologist      Tobacco: trying to quit      Alcohol: none; rare      Drugs: none      Exercise: walking daily 2 miles      Caffeine use: Tea/pepsi- 1 pepsi/morning      Tea- 3 cups per day   Right-handed   Social Determinants of Health   Financial Resource Strain: Not on file  Food Insecurity: Not on file  Transportation Needs: Not on file  Physical Activity: Not on file  Stress: Not on file  Social Connections: Not on file  Intimate Partner Violence: Not on file    Outpatient Medications Prior to Visit  Medication Sig  Dispense Refill   aspirin 81 MG tablet Take 1 tablet (81 mg total) by mouth daily. 30 tablet    atorvastatin (LIPITOR) 10 MG tablet Take 1 tablet (10 mg total) by mouth daily. 90 tablet 0   fenofibrate 160 MG tablet TAKE 1 TABLET(160 MG) BY MOUTH DAILY 90 tablet 1   metoprolol succinate (TOPROL-XL) 50 MG 24 hr tablet Take 1 tablet (50 mg total) by mouth daily. Take with or immediately following a meal 90 tablet 0   acyclovir (ZOVIRAX) 400 MG tablet TAKE 1 TABLET(400 MG) BY MOUTH TWICE DAILY 180 tablet 0   ALPRAZolam (XANAX) 0.25 MG tablet Take 1 tablet (0.25 mg total) by mouth 2 (two) times daily as needed. for anxiety 15 tablet 0   ondansetron (ZOFRAN-ODT) 4 MG disintegrating tablet Take 1 tablet (4 mg total) by mouth every 8 (eight) hours as needed for nausea or vomiting. (Patient not taking: Reported on 02/01/2023) 20 tablet 0   No facility-administered medications prior to visit.    Allergies  Allergen Reactions   Penicillins Other (See Comments)    Childhood reaction    Review of Systems  Constitutional:  Negative for fever.  HENT:  Negative for congestion, sinus pain and sore throat.   Respiratory:  Negative for cough, shortness of breath and wheezing.   Cardiovascular:  Negative for chest pain and palpitations.  Gastrointestinal:  Negative for abdominal pain, constipation, diarrhea, nausea and vomiting.  Genitourinary:  Negative for dysuria, frequency and hematuria.  Musculoskeletal:        (-)new muscle pain (-)new joint pain  Skin:        (-)New moles  Neurological:  Negative for headaches.       Objective:    Physical Exam Vitals and nursing note reviewed.  Constitutional:      General: She is not in acute distress.    Appearance: Normal appearance. She is not ill-appearing.  HENT:     Head: Normocephalic and atraumatic.     Right Ear: Tympanic membrane, ear canal and external ear normal.     Left Ear: Tympanic membrane, ear canal and external ear normal.  Eyes:      Extraocular Movements: Extraocular movements intact.     Pupils: Pupils are equal, round, and reactive to light.  Cardiovascular:     Rate and Rhythm: Normal rate and regular rhythm.     Heart sounds:  Normal heart sounds. No murmur heard.    No gallop.  Pulmonary:     Effort: Pulmonary effort is normal. No respiratory distress.     Breath sounds: Normal breath sounds. No wheezing or rales.  Abdominal:     General: Bowel sounds are normal. There is no distension.     Palpations: Abdomen is soft.     Tenderness: There is no abdominal tenderness. There is no guarding.  Musculoskeletal:        General: Normal range of motion.  Skin:    General: Skin is warm and dry.  Neurological:     General: No focal deficit present.     Mental Status: She is alert and oriented to person, place, and time.  Psychiatric:        Mood and Affect: Mood normal.        Judgment: Judgment normal.     BP 108/80 (BP Location: Right Arm, Patient Position: Sitting, Cuff Size: Normal)   Pulse 76   Temp 97.8 F (36.6 C) (Oral)   Resp 18   Ht  (1.676 m)   Wt 144 lb 3.2 oz (65.4 kg)   SpO2 95%   BMI 23.27 kg/m  Wt Readings from Last 3 Encounters:  02/01/23 144 lb 3.2 oz (65.4 kg)  12/14/22 142 lb (64.4 kg)  05/01/22 145 lb 12.8 oz (66.1 kg)       Assessment & Plan:  Preventative health care Assessment & Plan: Ghm utd Check labs  Health Maintenance  Topic Date Due   COLONOSCOPY (Pts 45-87yrs Insurance coverage will need to be confirmed)  Never done   COVID-19 Vaccine (3 - Pfizer risk series) 02/13/2020   INFLUENZA VACCINE  05/10/2023   DTaP/Tdap/Td (3 - Td or Tdap) 01/31/2033   Hepatitis C Screening  Completed   HIV Screening  Completed   Zoster Vaccines- Shingrix  Completed   HPV VACCINES  Aged Out     Orders: -     CBC with Differential/Platelet -     Comprehensive metabolic panel -     Lipid panel -     TSH  High risk medication use -     Drug Monitoring Panel [id number] ,  Urine  Generalized anxiety disorder -     ALPRAZolam; Take 1 tablet (0.25 mg total) by mouth 2 (two) times daily as needed. for anxiety  Dispense: 15 tablet; Refill: 0 -     Escitalopram Oxalate; Take 1 tablet (10 mg total) by mouth daily.  Dispense: 30 tablet; Refill: 2 -     Ambulatory referral to Psychiatry  Hyperlipidemia, unspecified hyperlipidemia type -     CBC with Differential/Platelet -     Comprehensive metabolic panel -     Lipid panel -     TSH  Primary hypertension -     CBC with Differential/Platelet -     Comprehensive metabolic panel -     Lipid panel -     TSH  Genital herpes simplex, unspecified site -     Acyclovir; TAKE 1 TABLET(400 MG) BY MOUTH TWICE DAILY  Dispense: 180 tablet; Refill: 0  Colon cancer screening Assessment & Plan: Ghm utd Check labs  Health Maintenance  Topic Date Due   COLONOSCOPY (Pts 45-62yrs Insurance coverage will need to be confirmed)  Never done   COVID-19 Vaccine (3 - Pfizer risk series) 02/13/2020   INFLUENZA VACCINE  05/10/2023   DTaP/Tdap/Td (3 - Td or Tdap) 01/31/2033   Hepatitis C Screening  Completed   HIV Screening  Completed   Zoster Vaccines- Shingrix  Completed   HPV VACCINES  Aged Out     Orders: -     Ambulatory referral to Gastroenterology  Need for Tdap vaccination -     Tdap vaccine greater than or equal to 7yo IM    I, [name], DO, personally preformed the services described in this documentation.  All medical record entries made by the scribe were at my direction and in my presence.  I have reviewed the chart and discharge instructions (if applicable) and agree that the record reflects my personal performance and is accurate and complete. 02/01/2023   I,[name],acting as a scribe for [name], DO.,have documented all relevant documentation on the behalf of [name], DO,as directed by  [name], DO while in the presence of [name],  DO.   [name], DO

## 2023-02-03 ENCOUNTER — Ambulatory Visit
Admission: EM | Admit: 2023-02-03 | Discharge: 2023-02-03 | Disposition: A | Discharge status: Home / Self Care | Payer: Commercial Managed Care - PPO | Attending: Urgent Care | Admitting: Urgent Care

## 2023-02-03 DIAGNOSIS — R42 Dizziness and giddiness: Secondary | ICD-10-CM | POA: Diagnosis not present

## 2023-02-03 LAB — DRUG MONITORING PANEL 376104, URINE
Amphetamines: NEGATIVE ng/mL (ref <500)
Barbiturates: NEGATIVE ng/mL (ref <300)
Benzodiazepines: NEGATIVE ng/mL (ref <100)
Cocaine Metabolite: NEGATIVE ng/mL (ref <150)
Desmethyltramadol: NEGATIVE ng/mL (ref <100)
Opiates: NEGATIVE ng/mL (ref <100)
Oxycodone: NEGATIVE ng/mL (ref <100)
Tramadol: NEGATIVE ng/mL (ref <100)

## 2023-02-03 LAB — DM TEMPLATE

## 2023-02-03 NOTE — Discharge Instructions (Addendum)
Follow-up with your primary care provider if your symptoms continue.  I encourage you to increase hydration.

## 2023-02-03 NOTE — ED Provider Notes (Signed)
Renaldo Fiddler    CSN: [id number] Arrival date & time: 02/03/23  1233      History   Chief Complaint Chief Complaint  Patient presents with   Dizziness   Nausea    HPI [name] is a 57 y.o. female.    Dizziness   Presents to urgent care with complaint of lightheaded feeling as well as nausea.  Symptoms x 1 day.  Patient was seen by her primary care provider on 12/14/2022 and 02/01/2023 for similar complaint.  Chart indicates recent use of high-dose nicotine patches for smoking cessation after 36-pack-year history.  She states she has not been using any nicotine products in 2 weeks and has not resumed smoking  12/14/2022 ECG was reassuring with diffuse nonspecific T wave changes similar to prior EKGs.  Labs were normal.  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/10/2018   Chronic pain of right ankle 07/26/2018   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   EYE SURGERY     MASTECTOMY     bilateral---1999/2004    OB History   No obstetric history on file.      Home Medications    Prior to Admission medications   Medication Sig Start Date End Date Taking? Authorizing Provider  acyclovir (ZOVIRAX) 400 MG tablet TAKE 1 TABLET(400 MG) BY MOUTH TWICE DAILY 02/01/23   Zola Button, Grayling Congress, DO  ALPRAZolam (XANAX) 0.25 MG tablet Take 1 tablet (0.25 mg total) by mouth 2 (two) times daily as needed. for anxiety 02/01/23   Zola Button, Myrene Buddy R, DO  aspirin 81 MG tablet Take 1 tablet (81 mg total) by mouth daily. 12/02/13   Joseph Art, DO  atorvastatin (LIPITOR) 10 MG tablet Take 1 tablet (10 mg total) by mouth daily. 12/22/22   Seabron Spates R, DO  escitalopram (LEXAPRO) 10 MG tablet Take 1 tablet (10 mg total) by mouth daily. 02/01/23   Seabron Spates R, DO  fenofibrate 160 MG tablet TAKE 1 TABLET(160 MG) BY MOUTH DAILY 11/22/22   Zola Button, Grayling Congress, DO  metoprolol succinate (TOPROL-XL) 50 MG 24 hr tablet Take 1 tablet (50 mg total) by mouth daily. Take with or immediately following a meal 12/22/22   Zola Button, Grayling Congress, DO  ondansetron (ZOFRAN-ODT)  4 MG disintegrating tablet Take 1 tablet (4 mg total) by mouth every 8 (eight) hours as needed for nausea or vomiting. Patient not taking: Reported on 02/01/2023 12/07/21   Sharlene Dory, DO    Family History Family History  Problem Relation Age of Onset   Aortic aneurysm Mother    Stroke Sister 66       after starting OCP   Breast cancer Sister    Diabetes Sister    Hyperlipidemia Sister    Diabetes Mellitus I Sister    Multiple sclerosis Nephew    Aortic aneurysm Other    Hypertension Other    Seizures Neg Hx    Migraines Neg Hx     Social History Social History   Tobacco Use   Smoking status: Some Days    Packs/day: 0.25    Years: 27.00    Additional pack years: 0.00    Total  pack years: 6.75    Types: Cigarettes   Smokeless tobacco: Never   Tobacco comments:    Trying to quit  Vaping Use   Vaping Use: Never used  Substance Use Topics   Alcohol use: Yes    Alcohol/week: 0.0 standard drinks of alcohol    Comment: Rare   Drug use: No     Allergies   Penicillins   Review of Systems Review of Systems  Neurological:  Positive for dizziness.     Physical Exam Triage Vital Signs ED Triage Vitals  Enc Vitals Group     BP      Pulse      Resp      Temp      Temp src      SpO2      Weight      Height      Head Circumference      Peak Flow      Pain Score      Pain Loc      Pain Edu?      Excl. in GC?    No data found.  Updated Vital Signs There were no vitals taken for this visit.  Visual Acuity Right Eye Distance:   Left Eye Distance:   Bilateral Distance:    Right Eye Near:   Left Eye Near:    Bilateral Near:     Physical Exam Vitals reviewed.  Constitutional:      Appearance: Normal appearance.  Cardiovascular:     Rate and Rhythm: Normal rate and regular rhythm.     Pulses: Normal pulses.     Heart sounds: Normal heart sounds.  Pulmonary:     Effort: Pulmonary effort is normal.     Breath sounds: Normal breath sounds.  Skin:    General: Skin is warm and dry.  Neurological:     General: No focal deficit present.     Mental Status: She is alert and oriented to person, place, and time.  Psychiatric:        Mood and Affect: Mood normal.        Behavior: Behavior normal.      UC Treatments / Results  Labs (all labs ordered are listed, but only abnormal results are displayed) Labs Reviewed - No data to display  EKG   Radiology No results found.  Procedures Procedures (including critical care time)  Medications Ordered in UC Medications - No data to display  Initial Impression / Assessment and Plan / UC Course  I have reviewed the triage vital signs  and the nursing notes.  Pertinent labs & imaging  results that were available during my care of the patient were reviewed by me and considered in my medical decision making (see chart for details).   [name] is a 57 y.o. female presenting with fatigue, lightheadedness. Patient is afebrile without recent antipyretics, satting well on room air. Overall is well appearing though non-toxic, well hydrated, without respiratory distress. Pulmonary exam is unremarkable.  Lungs CTAB without wheezing, rhonchi, rales.  Congratulated the patient on her progress in quitting smoking and no longer using nicotine products at all.  ECG today is again reassuring with nonspecific T wave changes similar to 12/14/2022 ECG and previous.  Reassured the patient that she has not experienced a heart attack and "is not dying of a heart problem".  Encouraged her to follow-up as planned with her cardiologist.  Also encouraged her to increase hydration since she confides that she does not drink much water.  Also suggested techniques to reduce stress which may be playing a part in the symptoms she experienced.  Final Clinical Impressions(s) / UC Diagnoses   Final diagnoses:  Lightheadedness   Discharge Instructions   None    ED Prescriptions   None    PDMP not reviewed this encounter.   Charma Igo, [place] 02/03/23 1423

## 2023-02-03 NOTE — ED Triage Notes (Signed)
Patient presents to UC for lightheadedness, nausea since yesterday. States she has taken something her daughter gave her for nausea. Has not been hydrating as she should be. Meets with cardiology in July for these episodes of lightheadedness.   Denies chest pain, SOB.

## 2023-02-05 ENCOUNTER — Encounter: Payer: Self-pay | Admitting: Family Medicine

## 2023-02-05 ENCOUNTER — Ambulatory Visit: Payer: Commercial Managed Care - PPO | Admitting: Family Medicine

## 2023-02-05 VITALS — BP 108/80 | HR 88 | Temp 97.9°F | Resp 18 | Ht 66.0 in | Wt 138.8 lb

## 2023-02-05 DIAGNOSIS — R110 Nausea: Secondary | ICD-10-CM

## 2023-02-05 DIAGNOSIS — R5383 Other fatigue: Secondary | ICD-10-CM

## 2023-02-05 LAB — COMPREHENSIVE METABOLIC PANEL
ALT: 21 U/L (ref 0–35)
AST: 26 U/L (ref 0–37)
Albumin: 4.2 g/dL (ref 3.5–5.2)
Alkaline Phosphatase: 46 U/L (ref 39–117)
BUN: 22 mg/dL (ref 6–23)
CO2: 26 mEq/L (ref 19–32)
Calcium: 9.8 mg/dL (ref 8.4–10.5)
Chloride: 102 mEq/L (ref 96–112)
Creatinine, Ser: 1.09 mg/dL (ref 0.40–1.20)
GFR: 56.75 mL/min — ABNORMAL LOW (ref >60.00)
Glucose, Bld: 94 mg/dL (ref 70–99)
Potassium: 4.2 mEq/L (ref 3.5–5.1)
Sodium: 137 mEq/L (ref 135–145)
Total Bilirubin: 0.4 mg/dL (ref 0.2–1.2)
Total Protein: 7.4 g/dL (ref 6.0–8.3)

## 2023-02-05 LAB — CBC WITH DIFFERENTIAL/PLATELET
Basophils Absolute: 0 10*3/uL (ref 0.0–0.1)
Basophils Relative: 0.6 % (ref 0.0–3.0)
Eosinophils Absolute: 0.1 10*3/uL (ref 0.0–0.7)
Eosinophils Relative: 1 % (ref 0.0–5.0)
HCT: 39 % (ref 36.0–46.0)
Hemoglobin: 13.2 g/dL (ref 12.0–15.0)
Lymphocytes Relative: 21.1 % (ref 12.0–46.0)
Lymphs Abs: 1.2 10*3/uL (ref 0.7–4.0)
MCHC: 33.9 g/dL (ref 30.0–36.0)
MCV: 89.7 fl (ref 78.0–100.0)
Monocytes Absolute: 0.3 10*3/uL (ref 0.1–1.0)
Monocytes Relative: 5.6 % (ref 3.0–12.0)
Neutro Abs: 4 10*3/uL (ref 1.4–7.7)
Neutrophils Relative %: 71.7 % (ref 43.0–77.0)
Platelets: 325 10*3/uL (ref 150.0–400.0)
RBC: 4.35 Mil/uL (ref 3.87–5.11)
RDW: 13.4 % (ref 11.5–15.5)
WBC: 5.6 10*3/uL (ref 4.0–10.5)

## 2023-02-05 LAB — POC COVID19 BINAXNOW: SARS Coronavirus 2 Ag: NEGATIVE

## 2023-02-05 LAB — VITAMIN D 25 HYDROXY (VIT D DEFICIENCY, FRACTURES): VITD: 10.51 ng/mL — ABNORMAL LOW (ref 30.00–100.00)

## 2023-02-05 LAB — POC INFLUENZA A&B (BINAX/QUICKVUE)
Influenza A, POC: NEGATIVE
Influenza B, POC: NEGATIVE

## 2023-02-05 LAB — MONONUCLEOSIS SCREEN: Mono Screen: NEGATIVE

## 2023-02-05 LAB — VITAMIN B12: Vitamin B-12: 320 pg/mL (ref 211–911)

## 2023-02-05 MED ORDER — ONDANSETRON 4 MG PO TBDP
4.0000 mg | ORAL_TABLET | Freq: Three times a day (TID) | ORAL | 0 refills | Status: DC | PRN
Start: 2023-02-05 — End: 2024-04-10

## 2023-02-05 NOTE — Progress Notes (Addendum)
Subjective:   By signing my name below, I, [name], attest that this documentation has been prepared under the direction and in the presence of [name], DO 02/05/23   Patient ID: [name], female    DOB: 10-28-1965, 57 y.o.   MRN: [id number]  Chief Complaint  Patient presents with   Fatigue    Sxs started Friday afternoon, pt states having fatigue, nausea/vomiting. Pt went to UC on Saturday. UC did an EKG and was normal. Negative COVID yesterday.     HPI Patient is in today for an office visit.   She complains of severe fatigue, nausea, lightheadedness beginning Friday afternoon. She has tested negative for Covid yesterday using an at home test. She endorses a bit of heartburn from eating a soup yesterday. She otherwise has not had an appetite to eat and has not been eating much. She has mainly been drinking water. She took an OTC medication for nausea on Saturday. She went to urgent care, EKG was normal. Denies any fever or vomiting.   Past Medical History:  Diagnosis Date   Anxiety    Breast cancer [facility]) 1999   2004   Headache    History of exercise stress test    GXT 9/19:  Exercised 5'48"; METs: 7; Normal BP response; No ischemic EKG changes   Hyperlipidemia    Numbness    Tobacco abuse     Past Surgical History:  Procedure Laterality Date   ABDOMINAL HYSTERECTOMY  2006   TAH,BSO secondary bleeding and breast cancer--   BREAST SURGERY     CORNEAL TRANSPLANT Bilateral    6x, started in 1999, last was 2006.   EYE SURGERY     MASTECTOMY     bilateral---1999/2004    Family History  Problem Relation Age of Onset   Aortic aneurysm Mother    Stroke Sister 45       after starting OCP   Breast cancer Sister    Diabetes Sister    Hyperlipidemia Sister    Diabetes Mellitus I Sister    Multiple sclerosis Nephew    Aortic aneurysm Other    Hypertension Other    Seizures Neg Hx    Migraines Neg Hx     Social History   Socioeconomic History    Marital status: Single    Spouse name: Not on file   Number of children: 1   Years of education: 16   Highest education level: Not on file  Occupational History   Occupation: works for [employer]; office specialist  Tobacco Use   Smoking status: Some Days    Packs/day: 0.25    Years: 27.00    Additional pack years: 0.00    Total pack years: 6.75    Types: Cigarettes   Smokeless tobacco: Never   Tobacco comments:    Trying to quit  Vaping Use   Vaping Use: Never used  Substance and Sexual Activity   Alcohol use: Yes    Alcohol/week: 0.0 standard drinks of alcohol    Comment: Rare   Drug use: No   Sexual activity: Not Currently  Other Topics Concern   Not on file  Social History Narrative   Marital status: single      Children: one daughter      Lives: with sister      Employment: works for [employer]; office specialist      Education: Financial controller for [employer]  Tobacco: trying to quit      Alcohol: none; rare      Drugs: none      Exercise: walking daily 2 miles      Caffeine use: Tea/pepsi- 1 pepsi/morning      Tea- 3 cups per day   Right-handed   Social Determinants of Health   Financial Resource Strain: Not on file  Food Insecurity: Not on file  Transportation Needs: Not on file  Physical Activity: Not on file  Stress: Not on file  Social Connections: Not on file  Intimate Partner Violence: Not on file    Outpatient Medications Prior to Visit  Medication Sig Dispense Refill   acyclovir (ZOVIRAX) 400 MG tablet TAKE 1 TABLET(400 MG) BY MOUTH TWICE DAILY 180 tablet 0   ALPRAZolam (XANAX) 0.25 MG tablet Take 1 tablet (0.25 mg total) by mouth 2 (two) times daily as needed. for anxiety 15 tablet 0   aspirin 81 MG tablet Take 1 tablet (81 mg total) by mouth daily. 30 tablet    atorvastatin (LIPITOR) 10 MG tablet Take 1 tablet (10 mg total) by mouth daily. 90 tablet 0   escitalopram (LEXAPRO) 10 MG tablet Take 1 tablet (10 mg total)  by mouth daily. 30 tablet 2   fenofibrate 160 MG tablet TAKE 1 TABLET(160 MG) BY MOUTH DAILY 90 tablet 1   metoprolol succinate (TOPROL-XL) 50 MG 24 hr tablet Take 1 tablet (50 mg total) by mouth daily. Take with or immediately following a meal 90 tablet 0   ondansetron (ZOFRAN-ODT) 4 MG disintegrating tablet Take 1 tablet (4 mg total) by mouth every 8 (eight) hours as needed for nausea or vomiting. (Patient not taking: Reported on 02/01/2023) 20 tablet 0   No facility-administered medications prior to visit.    Allergies  Allergen Reactions   Penicillins Other (See Comments)    Childhood reaction    Review of Systems  Constitutional:  Positive for malaise/fatigue (severe). Negative for fever.  HENT:  Negative for congestion.   Eyes:  Negative for blurred vision.  Respiratory:  Negative for shortness of breath.   Cardiovascular:  Negative for chest pain, palpitations and leg swelling.  Gastrointestinal:  Positive for heartburn and nausea. Negative for abdominal pain and blood in stool.  Genitourinary:  Negative for dysuria and frequency.  Musculoskeletal:  Negative for falls.  Skin:  Negative for rash.  Neurological:  Negative for dizziness, loss of consciousness and headaches.       (+) lightheadedness  Endo/Heme/Allergies:  Negative for environmental allergies.  Psychiatric/Behavioral:  Negative for depression. The patient is not nervous/anxious.        Objective:    Physical Exam Vitals and nursing note reviewed.  Constitutional:      Appearance: She is well-developed.  HENT:     Head: Normocephalic and atraumatic.  Eyes:     Conjunctiva/sclera: Conjunctivae normal.  Neck:     Thyroid: No thyromegaly.     Vascular: No carotid bruit or JVD.  Cardiovascular:     Rate and Rhythm: Normal rate and regular rhythm.     Heart sounds: Normal heart sounds. No murmur heard. Pulmonary:     Effort: Pulmonary effort is normal. No respiratory distress.     Breath sounds: Normal  breath sounds. No wheezing or rales.  Chest:     Chest wall: No tenderness.  Abdominal:     Palpations: Abdomen is soft.     Tenderness: There is no guarding or rebound.  Musculoskeletal:  Cervical back: Normal range of motion and neck supple.  Neurological:     Mental Status: She is alert and oriented to person, place, and time.  Psychiatric:        Mood and Affect: Mood normal.        Thought Content: Thought content normal.     BP 108/80 (BP Location: Right Arm, Patient Position: Sitting, Cuff Size: Normal)   Pulse 88   Temp 97.9 F (36.6 C) (Oral)   Resp 18   Ht 5\' 6"  (1.676 m)   Wt 138 lb 12.8 oz (63 kg)   SpO2 97%   BMI 22.40 kg/m  Wt Readings from Last 3 Encounters:  02/05/23 138 lb 12.8 oz (63 kg)  02/01/23 144 lb 3.2 oz (65.4 kg)  12/14/22 142 lb (64.4 kg)       Assessment & Plan:  Other fatigue -     CBC with Differential/Platelet -     Comprehensive metabolic panel -     Epstein-Barr virus VCA antibody panel -     Mononucleosis screen -     Vitamin B12 -     VITAMIN D 25 Hydroxy (Vit-D Deficiency, Fractures) -     POC COVID-19 BinaxNow -     POC Influenza A&B(BINAX/QUICKVUE) -     POCT Urinalysis Dipstick (Automated) -     COVID-19, Flu A+B and RSV; Future  Nausea -     Ondansetron; Take 1 tablet (4 mg total) by mouth every 8 (eight) hours as needed for nausea or vomiting.  Dispense: 20 tablet; Refill: 0 -     POCT Urinalysis Dipstick (Automated) -     COVID-19, Flu A+B and RSV; Future   Covid and flu neg----will repeat in lab Drink fluids  Pedialyte, ice pops  (brat diet)   I,Rachel Rivera,acting as a scribe for [name], DO.,have documented all relevant documentation on the behalf of [name], DO,as directed by  [name], DO while in the presence of [name], DO.   I, [name], DO, personally preformed the services described in this documentation.  All medical record entries made by  the scribe were at my direction and in my presence.  I have reviewed the chart and discharge instructions (if applicable) and agree that the record reflects my personal performance and is accurate and complete. 02/05/23   [name], DO

## 2023-02-06 LAB — EPSTEIN-BARR VIRUS VCA ANTIBODY PANEL
EBV NA IgG: >600 U/mL — ABNORMAL HIGH
EBV VCA IgG: 242 U/mL — ABNORMAL HIGH
EBV VCA IgM: <36 U/mL

## 2023-02-07 ENCOUNTER — Other Ambulatory Visit: Payer: Self-pay

## 2023-02-07 LAB — COVID-19, FLU A+B AND RSV
Influenza A, NAA: NOT DETECTED
Influenza B, NAA: NOT DETECTED
RSV, NAA: NOT DETECTED
SARS-CoV-2, NAA: NOT DETECTED

## 2023-02-07 MED ORDER — VITAMIN D (ERGOCALCIFEROL) 1.25 MG (50000 UNIT) PO CAPS: 50000.0000 [IU] | ORAL_CAPSULE | ORAL | 1 refills | Status: DC

## 2023-02-08 ENCOUNTER — Encounter: Payer: Self-pay | Admitting: *Deleted

## 2023-02-08 ENCOUNTER — Emergency Department
Admission: EM | Admit: 2023-02-08 | Discharge: 2023-02-08 | Disposition: A | Discharge status: Home / Self Care | Payer: Commercial Managed Care - PPO | Attending: Emergency Medicine | Admitting: Emergency Medicine

## 2023-02-08 ENCOUNTER — Other Ambulatory Visit: Payer: Self-pay

## 2023-02-08 DIAGNOSIS — Z853 Personal history of malignant neoplasm of breast: Secondary | ICD-10-CM | POA: Diagnosis not present

## 2023-02-08 DIAGNOSIS — R8289 Other abnormal findings on cytological and histological examination of urine: Secondary | ICD-10-CM | POA: Diagnosis not present

## 2023-02-08 DIAGNOSIS — R5383 Other fatigue: Secondary | ICD-10-CM

## 2023-02-08 LAB — BASIC METABOLIC PANEL
Anion gap: 8 (ref 5–15)
BUN: 16 mg/dL (ref 6–20)
CO2: 26 mmol/L (ref 22–32)
Calcium: 9.8 mg/dL (ref 8.9–10.3)
Chloride: 103 mmol/L (ref 98–111)
Creatinine, Ser: 1.05 mg/dL — ABNORMAL HIGH (ref 0.44–1.00)
GFR, Estimated: >60 mL/min (ref >60)
Glucose, Bld: 95 mg/dL (ref 70–99)
Potassium: 4.9 mmol/L (ref 3.5–5.1)
Sodium: 137 mmol/L (ref 135–145)

## 2023-02-08 LAB — CBC
HCT: 38.9 % (ref 36.0–46.0)
Hemoglobin: 12.8 g/dL (ref 12.0–15.0)
MCH: 29.3 pg (ref 26.0–34.0)
MCHC: 32.9 g/dL (ref 30.0–36.0)
MCV: 89 fL (ref 80.0–100.0)
Platelets: 314 10*3/uL (ref 150–400)
RBC: 4.37 MIL/uL (ref 3.87–5.11)
RDW: 12.4 % (ref 11.5–15.5)
WBC: 5.8 10*3/uL (ref 4.0–10.5)
nRBC: 0 % (ref 0.0–0.2)

## 2023-02-08 LAB — URINALYSIS, ROUTINE W REFLEX MICROSCOPIC
Bacteria, UA: NONE SEEN
Bilirubin Urine: NEGATIVE
Glucose, UA: NEGATIVE mg/dL
Hgb urine dipstick: NEGATIVE
Ketones, ur: NEGATIVE mg/dL
Nitrite: NEGATIVE
Protein, ur: NEGATIVE mg/dL
Specific Gravity, Urine: 1.009 (ref 1.005–1.030)
pH: 5 (ref 5.0–8.0)

## 2023-02-08 LAB — TROPONIN I (HIGH SENSITIVITY): Troponin I (High Sensitivity): 4 ng/L (ref <18)

## 2023-02-08 MED ORDER — SODIUM CHLORIDE 0.9 % IV BOLUS
500.0000 mL | Freq: Once | INTRAVENOUS | Status: AC
  Administered 2023-02-08: 500 mL via INTRAVENOUS

## 2023-02-08 MED ORDER — ONDANSETRON HCL 4 MG/2ML IJ SOLN
4.0000 mg | Freq: Once | INTRAMUSCULAR | Status: AC
  Administered 2023-02-08: 4 mg via INTRAVENOUS
  Filled 2023-02-08: qty 2

## 2023-02-08 NOTE — Discharge Instructions (Signed)
You were seen in the emergency department for fatigue.  Your urine did not show an obvious urinary tract infection, you did not have any symptoms of UTI so we will hold on antibiotics at this time.  We sent a urine culture though will result in the next couple of days, if positive for an infection someone will call you and start you on antibiotics.  Follow-up with your primary care provider.  Stay hydrated and drink plenty of fluids.  Return to the emergency department for any new or worsening symptoms.

## 2023-02-08 NOTE — ED Provider Notes (Signed)
[facility] Provider Note    Event Date/Time   First MD Initiated Contact with Patient 02/08/23 1926     (approximate)   History   Nausea and Fatigue   HPI  [name] is a 57 y.o. female presents to the emergency department with fatigue.  Endorses significant fatigue that has been ongoing since this past Friday.  Has been evaluated at urgent care and with her primary care provider.  Significant amount of lab work that was done without an obvious source of her severe fatigue.  Denies any headache or change in vision.  Denies any chest pain or shortness of breath.  Denies nausea, vomiting or diaphoresis.  Denies any dysuria, urinary urgency or frequency.  Denies any abdominal pain.  No rashes.  No new medications.  Denies any falls or head trauma.  No unexplained weight gain or weight loss.  Prior total hysterectomy following breast cancer.     Physical Exam   Triage Vital Signs: ED Triage Vitals  Enc Vitals Group     BP 02/08/23 1811 (!) 129/93     Pulse Rate 02/08/23 1811 78     Resp 02/08/23 1811 20     Temp 02/08/23 1811 98.4 F (36.9 C)     Temp Source 02/08/23 1811 Oral     SpO2 02/08/23 1811 100 %     Weight 02/08/23 1808 135 lb (61.2 kg)     Height 02/08/23 1808 5\' 6"  (1.676 m)     Head Circumference --      Peak Flow --      Pain Score 02/08/23 1808 0     Pain Loc --      Pain Edu? --      Excl. in GC? --     Most recent vital signs: Vitals:   02/08/23 2130 02/08/23 2159  BP: 114/84   Pulse: 65   Resp: 10   Temp:  98.5 F (36.9 C)  SpO2: 94%     Physical Exam Constitutional:      Appearance: She is well-developed.  HENT:     Head: Atraumatic.  Eyes:     Conjunctiva/sclera: Conjunctivae normal.  Cardiovascular:     Rate and Rhythm: Regular rhythm.  Pulmonary:     Effort: No respiratory distress.  Abdominal:     General: There is no distension.  Musculoskeletal:        General: Normal range of motion.     Cervical  back: Normal range of motion.  Skin:    General: Skin is warm.  Neurological:     Mental Status: She is alert. Mental status is at baseline.     GCS: GCS eye subscore is 4. GCS verbal subscore is 5. GCS motor subscore is 6.     Cranial Nerves: Cranial nerves 2-12 are intact.     Sensory: Sensation is intact.     Motor: Motor function is intact.     Coordination: Coordination is intact.     Gait: Gait is intact.     Deep Tendon Reflexes: Reflexes are normal and symmetric.     IMPRESSION / MDM / ASSESSMENT AND PLAN / ED COURSE  I reviewed the triage vital signs and the nursing notes.  Differential diagnosis including anemia, ACS, dehydration, electrolyte abnormality, urinary tract infection, chronic fatigue syndrome.  Clinical picture is not consistent with meningitis, no altered mental status, no fever, no meningismus.  On chart review patient had significant workup done within the  past 1 week including EBV, mono testing, TSH testing, vitamin D levels  EKG  I, Corena Herter, the attending physician, personally viewed and interpreted this ECG.   Rate: Normal  Rhythm: Normal sinus  Axis: Normal  Intervals: Normal  ST&T Change: None  No tachycardic or bradycardic dysrhythmias while on cardiac telemetry.  LABS (all labs ordered are listed, but only abnormal results are displayed) Labs interpreted as -    Labs Reviewed  BASIC METABOLIC PANEL - Abnormal; Notable for the following components:      Result Value   Creatinine, Ser 1.05 (*)    All other components within normal limits  URINALYSIS, ROUTINE W REFLEX MICROSCOPIC - Abnormal; Notable for the following components:   Color, Urine YELLOW (*)    APPearance HAZY (*)    Leukocytes,Ua LARGE (*)    All other components within normal limits  URINE CULTURE  CBC  TROPONIN I (HIGH SENSITIVITY)     MDM  Orthostatic blood pressures are negative.  Given 500 cc bolus.  UA with positive  leukocyte esterase but no nitrites or  bacteria, does have RBCs and WBCs.  Given that the patient is not having any symptoms of urinary tract infection urine was sent for culture and we will hold on any antibiotics at this time.  Continues to have a nonfocal neurologic exam, have a low suspicion for CVA.  Possible viral illness.  Discussed close follow-up with primary care physician and states that she has an appointment scheduled for tomorrow.  Given return precautions for any new or changing symptoms.     PROCEDURES:  Critical Care performed: No  Procedures  Patient's presentation is most consistent with acute presentation with potential threat to life or bodily function.   MEDICATIONS ORDERED IN ED: Medications  sodium chloride 0.9 % bolus 500 mL (0 mLs Intravenous Stopped 02/08/23 2149)  ondansetron (ZOFRAN) injection 4 mg (4 mg Intravenous Given 02/08/23 2042)    FINAL CLINICAL IMPRESSION(S) / ED DIAGNOSES   Final diagnoses:  Fatigue, unspecified type     Rx / DC Orders   ED Discharge Orders     None        Note:  This document was prepared using Dragon voice recognition software and may include unintentional dictation errors.   Corena Herter, MD 02/08/23 2232

## 2023-02-08 NOTE — ED Triage Notes (Addendum)
Pt reports fatigue, nausea.   Pt had saw her pmd this week and had negative results.    No v/d.  No chest pain or sob.  Sx for 1 week.  Pt alert speech clear.

## 2023-02-09 ENCOUNTER — Telehealth: Payer: Self-pay

## 2023-02-09 ENCOUNTER — Ambulatory Visit: Payer: Commercial Managed Care - PPO | Admitting: Family Medicine

## 2023-02-09 NOTE — Telephone Encounter (Signed)
Initial Comment Caller states she got a physical last month. Since then she has been feeling bad. She saw Dr. Laury Axon and had more tests. She is fatigued and lightheaded. Her EKG came back normal. Translation No Nurse Assessment Nurse: Zena Amos, RN, Margaret Date/Time (Eastern Time): 02/08/2023 12:10:12 PM Confirm and document reason for call. If symptomatic, describe symptoms. ---Caller states she is still feeling really bad, really nauseas light headed and tired. Saw Dr. on Monday and still feeling bad. Does the patient have any new or worsening symptoms? ---Yes Will a triage be completed? ---Yes Related visit to physician within the last 2 weeks? ---Yes Does the PT have any chronic conditions? (i.e. diabetes, asthma, this includes High risk factors for pregnancy, etc.) ---No Is this a behavioral health or substance abuse call? ---No Guidelines Guideline Title Affirmed Question Affirmed Notes Nurse Date/Time (Eastern Time) Weakness (Generalized) and Fatigue [1] MODERATE weakness (i.e., interferes with work, school, normal activities) AND [2] cause unknown (Exceptions: Weakness from acute minor illness or poor fluid intake; Vassallo, RN, Claris Che 02/08/2023 12:14:47 PM PLEASE NOTE: All timestamps contained within this report are represented as [place] Standard Time. CONFIDENTIALTY NOTICE: This fax transmission is intended only for the addressee. It contains information that is legally privileged, confidential or otherwise protected from use or disclosure. If you are not the intended recipient, you are strictly prohibited from reviewing, disclosing, copying using or disseminating any of this information or taking any action in reliance on or regarding this information. If you have received this fax in error, please notify us immediately by telephone so that we can arrange for its return to [place]. Phone: [phone], Toll-Free: [phone], Fax: [phone] Page: 2 of 2 Call  Id: [id number] Guidelines Guideline Title Affirmed Question Affirmed Notes Nurse Date/Time Lamount Cohen Time) weakness is chronic and not worse.) Disp. Time Lamount Cohen Time) Disposition Final User 02/08/2023 12:16:01 PM See HCP within 4 Hours (or PCP triage) Yes Zena Amos, RN, Claris Che Final Disposition 02/08/2023 12:16:01 PM See HCP within 4 Hours (or PCP triage) Yes Zena Amos, RN, Ruel Favors Disagree/Comply Comply Caller Understands Yes PreDisposition Go to ED Care Advice Given Per Guideline SEE HCP (OR PCP TRIAGE) WITHIN 4 HOURS: * IF OFFICE WILL BE OPEN: You need to be seen within the next 3 or 4 hours. Call your doctor (or NP/PA) now or as soon as the office opens. CALL BACK IF: * You become worse CARE ADVICE given per Weakness and Fatigue (Adult) guideline. Referrals REFERRED TO PCP OFFICE

## 2023-02-09 NOTE — Telephone Encounter (Signed)
Pt seen at ED 

## 2023-02-10 LAB — URINE CULTURE

## 2023-04-03 ENCOUNTER — Telehealth: Payer: Self-pay | Admitting: Family Medicine

## 2023-04-03 NOTE — Telephone Encounter (Signed)
Prescription Request  04/03/2023  Is this a "Controlled Substance" medicine? No  LOV: 02/05/2023  What is the name of the medication or equipment? metoprolol succinate (TOPROL-XL) 50 MG 24 hr tablet   atorvastatin (LIPITOR) 10 MG tablet   Have you contacted your pharmacy to request a refill? Yes   Which pharmacy would you like this sent to?  [id number] - [place], [place] - [address] AT [facility] OF [address] [place] [address] Phone: [phone] Fax: [phone]    Patient notified that their request is being sent to the clinical staff for review and that they should receive a response within 2 business days.   Please advise at Mobile [phone] (mobile)

## 2023-04-04 MED ORDER — METOPROLOL SUCCINATE ER 50 MG PO TB24: 50.0000 mg | ORAL_TABLET | Freq: Every day | ORAL | 1 refills | Status: DC

## 2023-04-04 MED ORDER — ATORVASTATIN CALCIUM 10 MG PO TABS: 10.0000 mg | ORAL_TABLET | Freq: Every day | ORAL | 1 refills | Status: DC

## 2023-04-04 NOTE — Addendum Note (Signed)
Addended by: Roxanne Gates on: 04/04/2023 09:28 AM   Modules accepted: Orders

## 2023-04-04 NOTE — Telephone Encounter (Signed)
Rx sent 

## 2023-04-10 ENCOUNTER — Ambulatory Visit: Payer: Commercial Managed Care - PPO | Attending: Interventional Cardiology | Admitting: Interventional Cardiology

## 2023-04-10 ENCOUNTER — Encounter: Payer: Self-pay | Admitting: Interventional Cardiology

## 2023-04-10 VITALS — BP 110/70 | HR 85 | Ht 66.0 in | Wt 142.0 lb

## 2023-04-10 DIAGNOSIS — Z720 Tobacco use: Secondary | ICD-10-CM

## 2023-04-10 DIAGNOSIS — E782 Mixed hyperlipidemia: Secondary | ICD-10-CM

## 2023-04-10 NOTE — Progress Notes (Addendum)
Cardiology Office Note   Date:  04/10/2023   ID:  Mary, Valdez Oct 06, 1966, MRN [id number]  PCP:  Zola Button, Grayling Congress, DO    No chief complaint on file.  hyperlipidemia  Wt Readings from Last 3 Encounters:  04/10/23 142 lb (64.4 kg)  02/08/23 135 lb (61.2 kg)  02/05/23 138 lb 12.8 oz (63 kg)       History of Present Illness: [name] is a 57 y.o. female with history of  breast CA s/p bilateral mastectomies, hyperlipidemia, anxiety, tobacco abuse, palpitations. who I first saw in 2016.  She had some shortness of breath at that time.  She smoked at that time and admittedly had issues with anxiety.  She wanted to check that her heart was okay to try Chantix for smoking cessation.  She has been evaluated for palpitations and atypical chest pain over the years.  Prior tests include: GXT 08/24/16 Blood pressure demonstrated a normal response to exercise. There was no ST segment deviation noted during stress. Clinically and electrically negative for ischemia Excellent exercise tolerance.   Carotid US 12/02/13 - The vertebral arteries appear patent with antegrade flow. - Findings consistent with 1-39 percent stenosis involving   the right internal carotid artery and the left internal   carotid artery.   Echo 12/02/13 EF 55-60, no RWMA, normal diastolic function, atrial septal aneurysm  2020 monitor: "Normal sinus rhtyhm with rare PACs and PVCs. No pathologic arrhythmias. No atrial fibrillation."   Has been doing well.  Sometimes gets nervous about her heart.  She just wanted to have a checkup.  She remains active.  She walks a lot at work.  Denies : Chest pain. Dizziness. Leg edema. Nitroglycerin use. Orthopnea. Palpitations. Paroxysmal nocturnal dyspnea. Shortness of breath. Syncope.    Past Medical History:  Diagnosis Date   Anxiety    Breast cancer [facility]) 1999   2004   Fatigue    Headache    History of exercise stress test    GXT 9/19:  Exercised  5'48"; METs: 7; Normal BP response; No ischemic EKG changes   Hyperlipidemia    Lightheadedness    Numbness    Tobacco abuse     Past Surgical History:  Procedure Laterality Date   ABDOMINAL HYSTERECTOMY  2006   TAH,BSO secondary bleeding and breast cancer--   BREAST SURGERY     CORNEAL TRANSPLANT Bilateral    6x, started in 1999, last was 2006.   EYE SURGERY     MASTECTOMY     bilateral---1999/2004     Current Outpatient Medications  Medication Sig Dispense Refill   Vitamin D, Ergocalciferol, (DRISDOL) 1.25 MG (50000 UNIT) CAPS capsule Take 1 capsule (50,000 Units total) by mouth every 7 (seven) days. 12 capsule 1   acyclovir (ZOVIRAX) 400 MG tablet TAKE 1 TABLET(400 MG) BY MOUTH TWICE DAILY 180 tablet 0   ALPRAZolam (XANAX) 0.25 MG tablet Take 1 tablet (0.25 mg total) by mouth 2 (two) times daily as needed. for anxiety 15 tablet 0   aspirin 81 MG tablet Take 1 tablet (81 mg total) by mouth daily. 30 tablet    atorvastatin (LIPITOR) 10 MG tablet Take 1 tablet (10 mg total) by mouth daily. 90 tablet 1   escitalopram (LEXAPRO) 10 MG tablet Take 1 tablet (10 mg total) by mouth daily. 30 tablet 2   fenofibrate 160 MG tablet TAKE 1 TABLET(160 MG) BY MOUTH DAILY 90 tablet 1   metoprolol succinate (TOPROL-XL) 50 MG  24 hr tablet Take 1 tablet (50 mg total) by mouth daily. Take with or immediately following a meal 90 tablet 1   ondansetron (ZOFRAN-ODT) 4 MG disintegrating tablet Take 1 tablet (4 mg total) by mouth every 8 (eight) hours as needed for nausea or vomiting. 20 tablet 0   No current facility-administered medications for this visit.    Allergies:   Penicillins    Social History:  The patient  reports that she has been smoking cigarettes. She has a 6.75 pack-year smoking history. She has never used smokeless tobacco. She reports current alcohol use. She reports that she does not use drugs.   Family History:  The patient's family history includes Aortic aneurysm in her mother  and another family member; Breast cancer in her sister; Diabetes in her sister; Diabetes Mellitus I in her sister; Hyperlipidemia in her sister; Hypertension in an other family member; Multiple sclerosis in her nephew; Stroke (age of onset: 63) in her sister.    ROS:  Please see the history of present illness.   Otherwise, review of systems are positive for able to stop smoking.   All other systems are reviewed and negative.    PHYSICAL EXAM: VS:  BP 110/70   Pulse 85   Ht 5\' 6"  (1.676 m)   Wt 142 lb (64.4 kg)   SpO2 96%   BMI 22.92 kg/m  , BMI Body mass index is 22.92 kg/m. GEN: Well nourished, well developed, in no acute distress HEENT: normal Neck: no JVD, carotid bruits, or masses Cardiac: RRR; no murmurs, rubs, or gallops,no edema  Respiratory:  clear to auscultation bilaterally, normal work of breathing GI: soft, nontender, nondistended, + BS MS: no deformity or atrophy Skin: warm and dry, no rash Neuro:  Strength and sensation are intact Psych: euthymic mood, full affect   EKG:   The ekg ordered today demonstrates NSR, nonspecific ST changes   Recent Labs: 02/01/2023: TSH 0.74 02/05/2023: ALT 21 02/08/2023: BUN 16; Creatinine, Ser 1.05; Hemoglobin 12.8; Platelets 314; Potassium 4.9; Sodium 137   Lipid Panel    Component Value Date/Time   CHOL 115 02/01/2023 1016   TRIG 80.0 02/01/2023 1016   HDL 33.80 (L) 02/01/2023 1016   CHOLHDL 3 02/01/2023 1016   VLDL 16.0 02/01/2023 1016   LDLCALC 65 02/01/2023 1016   LDLCALC 82 03/25/2021 1422   LDLDIRECT 90.0 03/17/2020 1038     Other studies Reviewed: Additional studies/ records that were reviewed today with results demonstrating: labs reviewed.   ASSESSMENT AND PLAN:  Tobacco abuse: She has stopped smoking- cold [place] in 11/2022. Hyperlipidemia: 2024 LDL 65 HDL 33 total cholesterol 115 triglycerides 80.  Continue atorvastatin along with a healthy diet.  Whole food plant-based diet.  High-fiber diet.  Avoid processed  foods.  Could consider coronary calcium score CT in the future, but she is already on a statin and that her target LDL.  I think she can just continue to try to improve lifestyle. Lymphedema: uses left arm sleeve, post BrCA surgery. We spoke about adding 2 days of resistance training to the exercise target noted below.   Current medicines are reviewed at length with the patient today.  The patient concerns regarding her medicines were addressed.  The following changes have been made:  No change  Labs/ tests ordered today include:  No orders of the defined types were placed in this encounter.   Recommend 150 minutes/week of aerobic exercise Low fat, low carb, high fiber diet recommended  Disposition:  FU as needed   Signed, Lance Muss, MD  04/10/2023 8:57 AM    [facility] [address] [place], [place], [place]  [address] Phone: [phone]; Fax: [phone]

## 2023-04-10 NOTE — Patient Instructions (Signed)
Medication Instructions:  Your physician recommends that you continue on your current medications as directed. Please refer to the Current Medication list given to you today.  *If you need a refill on your cardiac medications before your next appointment, please call your pharmacy*   Lab Work: none If you have labs (blood work) drawn today and your tests are completely normal, you will receive your results only by: MyChart Message (if you have MyChart) OR A paper copy in the mail If you have any lab test that is abnormal or we need to change your treatment, we will call you to review the results.   Testing/Procedures: none   Follow-Up: At [facility], you and your health needs are our priority.  As part of our continuing mission to provide you with exceptional heart care, we have created designated Provider Care Teams.  These Care Teams include your primary Cardiologist (physician) and Advanced Practice Providers (APPs -  Physician Assistants and Nurse Practitioners) who all work together to provide you with the care you need, when you need it.  We recommend signing up for the patient portal called "MyChart".  Sign up information is provided on this After Visit Summary.  MyChart is used to connect with patients for Virtual Visits (Telemedicine).  Patients are able to view lab/test results, encounter notes, upcoming appointments, etc.  Non-urgent messages can be sent to your provider as well.   To learn more about what you can do with MyChart, go to [email or web address].    Your next appointment:   As needed  Provider:   Lance Muss, MD     Other Instructions  High-Fiber Eating Plan Fiber, also called dietary fiber, is a type of carbohydrate. It is found foods such as fruits, vegetables, whole grains, and beans. A high-fiber diet can have many health benefits. Your health care provider may recommend a high-fiber diet to help: Prevent constipation. Fiber can make  your bowel movements more regular. Lower your cholesterol. Relieve the following conditions: Inflammation of veins in the anus (hemorrhoids). Inflammation of specific areas of the digestive tract (uncomplicated diverticulosis). A problem of the large intestine, also called the colon, that sometimes causes pain and diarrhea (irritable bowel syndrome, or IBS). Prevent overeating as part of a weight-loss plan. Prevent heart disease, type 2 diabetes, and certain cancers. What are tips for following this plan? Reading food labels  Check the nutrition facts label on food products for the amount of dietary fiber. Choose foods that have 5 grams of fiber or more per serving. The goals for recommended daily fiber intake include: Men (age 53 or younger): 34-38 g. Men (over age 4): 28-34 g. Women (age 63 or younger): 25-28 g. Women (over age 66): 22-25 g. Your daily fiber goal is _____________ g. Shopping Choose whole fruits and vegetables instead of processed forms, such as apple juice or applesauce. Choose a wide variety of high-fiber foods such as avocados, lentils, oats, and kidney beans. Read the nutrition facts label of the foods you choose. Be aware of foods with added fiber. These foods often have high sugar and sodium amounts per serving. Cooking Use whole-grain flour for baking and cooking. Cook with brown rice instead of white rice. Meal planning Start the day with a breakfast that is high in fiber, such as a cereal that contains 5 g of fiber or more per serving. Eat breads and cereals that are made with whole-grain flour instead of refined flour or white flour. Eat brown rice, bulgur wheat,  or millet instead of white rice. Use beans in place of meat in soups, salads, and pasta dishes. Be sure that half of the grains you eat each day are whole grains. General information You can get the recommended daily intake of dietary fiber by: Eating a variety of fruits, vegetables, grains,  nuts, and beans. Taking a fiber supplement if you are not able to take in enough fiber in your diet. It is better to get fiber through food than from a supplement. Gradually increase how much fiber you consume. If you increase your intake of dietary fiber too quickly, you may have bloating, cramping, or gas. Drink plenty of water to help you digest fiber. Choose high-fiber snacks, such as berries, raw vegetables, nuts, and popcorn. What foods should I eat? Fruits Berries. Pears. Apples. Oranges. Avocado. Prunes and raisins. Dried figs. Vegetables Sweet potatoes. Spinach. Kale. Artichokes. Cabbage. Broccoli. Cauliflower. Green peas. Carrots. Squash. Grains Whole-grain breads. Multigrain cereal. Oats and oatmeal. Brown rice. Barley. Bulgur wheat. Millet. Quinoa. Bran muffins. Popcorn. Rye wafer crackers. Meats and other proteins Navy beans, kidney beans, and pinto beans. Soybeans. Split peas. Lentils. Nuts and seeds. Dairy Fiber-fortified yogurt. Beverages Fiber-fortified soy milk. Fiber-fortified orange juice. Other foods Fiber bars. The items listed above may not be a complete list of recommended foods and beverages. Contact a dietitian for more information. What foods should I avoid? Fruits Fruit juice. Cooked, strained fruit. Vegetables Fried potatoes. Canned vegetables. Well-cooked vegetables. Grains White bread. Pasta made with refined flour. White rice. Meats and other proteins Fatty cuts of meat. Fried chicken or fried fish. Dairy Milk. Yogurt. Cream cheese. Sour cream. Fats and oils Butters. Beverages Soft drinks. Other foods Cakes and pastries. The items listed above may not be a complete list of foods and beverages to avoid. Talk with your dietitian about what choices are best for you. Summary Fiber is a type of carbohydrate. It is found in foods such as fruits, vegetables, whole grains, and beans. A high-fiber diet has many benefits. It can help to prevent  constipation, lower blood cholesterol, aid weight loss, and reduce your risk of heart disease, diabetes, and certain cancers. Increase your intake of fiber gradually. Increasing fiber too quickly may cause cramping, bloating, and gas. Drink plenty of water while you increase the amount of fiber you consume. The best sources of fiber include whole fruits and vegetables, whole grains, nuts, seeds, and beans. This information is not intended to replace advice given to you by your health care provider. Make sure you discuss any questions you have with your health care provider. Document Revised: 01/29/2020 Document Reviewed: 01/29/2020 Elsevier Patient Education  2024 [employer].

## 2023-06-08 ENCOUNTER — Encounter: Payer: Self-pay | Admitting: Family Medicine

## 2023-06-08 ENCOUNTER — Ambulatory Visit: Payer: Commercial Managed Care - PPO | Admitting: Family Medicine

## 2023-06-08 VITALS — BP 100/70 | HR 84 | Temp 98.4°F | Resp 18 | Ht 66.0 in | Wt 144.2 lb

## 2023-06-08 DIAGNOSIS — E785 Hyperlipidemia, unspecified: Secondary | ICD-10-CM | POA: Diagnosis not present

## 2023-06-08 DIAGNOSIS — I10 Essential (primary) hypertension: Secondary | ICD-10-CM | POA: Diagnosis not present

## 2023-06-08 DIAGNOSIS — Z23 Encounter for immunization: Secondary | ICD-10-CM

## 2023-06-08 MED ORDER — FENOFIBRATE 160 MG PO TABS
ORAL_TABLET | ORAL | 1 refills | Status: DC
Start: 2023-06-08 — End: 2023-12-18

## 2023-06-08 NOTE — Assessment & Plan Note (Signed)
Well controlled, no changes to meds. Encouraged heart healthy diet such as the DASH diet and exercise as tolerated.  °

## 2023-06-08 NOTE — Progress Notes (Signed)
Established Patient Office Visit  Subjective   Patient ID: [name], female    DOB: Sep 12, 1966  Age: 57 y.o. MRN: [id number]  Chief Complaint  Patient presents with   Hypertension   Hyperlipidemia   Follow-up    HPI Discussed the use of AI scribe software for clinical note transcription with the patient, who gave verbal consent to proceed.  History of Present Illness   The patient presents for a medication refill. She reports no current health issues, pains, or concerns. She is unsure if labs were due, but agrees to have them done during this visit to avoid a return trip. She also agrees to receive a flu shot during this visit. The patient mentions a previous visit where she felt unwell, but currently reports feeling fine.      Patient Active Problem List   Diagnosis Date Noted   Genital herpes simplex 02/01/2023   Lymphedema 05/01/2022   Bronchitis 10/11/2021   Preventative health care 03/25/2021   HTN (hypertension) 02/05/2019   Chronic nonintractable headache 10/15/2018   Hyperlipidemia 10/15/2018   Osteochondral lesion of talar dome 10/10/2018   Chronic pain of right ankle 07/26/2018   Osteochondral defect of talus 06/11/2018   Lightheadedness 08/14/2016   Enlarged pituitary gland (HCC) 08/08/2016   Generalized anxiety disorder 11/02/2015   Facial droop 12/01/2013   Sensory disturbance 12/01/2013   Trichomonas contact, treated 08/05/2013   UTI 11/22/2010   FLANK PAIN, LEFT 11/22/2010   TOBACCO USE 10/11/2010   BREAST CANCER, HX OF 10/11/2010   Past Medical History:  Diagnosis Date   Anxiety    Breast cancer (HCC) 1999   2004   Fatigue    Headache    History of exercise stress test    GXT 9/19:  Exercised 5'48"; METs: 7; Normal BP response; No ischemic EKG changes   Hyperlipidemia    Lightheadedness    Numbness    Tobacco abuse    Past Surgical History:  Procedure Laterality Date   ABDOMINAL HYSTERECTOMY  2006   TAH,BSO secondary bleeding and  breast cancer--   BREAST SURGERY     CORNEAL TRANSPLANT Bilateral    6x, started in 1999, last was 2006.   EYE SURGERY     MASTECTOMY     bilateral---1999/2004   Social History   Tobacco Use   Smoking status: Some Days    Current packs/day: 0.25    Average packs/day: 0.3 packs/day for 27.0 years (6.8 ttl pk-yrs)    Types: Cigarettes   Smokeless tobacco: Never   Tobacco comments:    Trying to quit  Vaping Use   Vaping status: Never Used  Substance Use Topics   Alcohol use: Yes    Alcohol/week: 0.0 standard drinks of alcohol    Comment: Rare   Drug use: No   Social History   Socioeconomic History   Marital status: Single    Spouse name: Not on file   Number of children: 1   Years of education: 16   Highest education level: Not on file  Occupational History   Occupation: works for [employer]; office specialist  Tobacco Use   Smoking status: Some Days    Current packs/day: 0.25    Average packs/day: 0.3 packs/day for 27.0 years (6.8 ttl pk-yrs)    Types: Cigarettes   Smokeless tobacco: Never   Tobacco comments:    Trying to quit  Vaping Use   Vaping status: Never Used  Substance and Sexual Activity  Alcohol use: Yes    Alcohol/week: 0.0 standard drinks of alcohol    Comment: Rare   Drug use: No   Sexual activity: Not Currently  Other Topics Concern   Not on file  Social History Narrative   Marital status: single      Children: one daughter      Lives: with sister      Employment: works for [employer]; office specialist      Education: Pharmacologist      Tobacco: trying to quit      Alcohol: none; rare      Drugs: none      Exercise: walking daily 2 miles      Caffeine use: Tea/pepsi- 1 pepsi/morning      Tea- 3 cups per day   Right-handed   Social Determinants of Health   Financial Resource Strain: Not on file  Food Insecurity: Not on file  Transportation Needs: Not on file  Physical Activity: Not on file   Stress: Not on file  Social Connections: Not on file  Intimate Partner Violence: Not on file   Family Status  Relation Name Status   Mother  Deceased at age 33       brain aneurysm   Father  Deceased at age 49       homicide   Sister  Alive   Sister  Alive   Sister  Alive       35   MGM  Deceased   MGF  Deceased   PGM  Deceased   PGF  Deceased   Education administrator   Other  (Not Specified)   Neg Hx  (Not Specified)  No partnership data on file   Family History  Problem Relation Age of Onset   Aortic aneurysm Mother    Stroke Sister 43       after starting OCP   Breast cancer Sister    Diabetes Sister    Hyperlipidemia Sister    Diabetes Mellitus I Sister    Multiple sclerosis Nephew    Aortic aneurysm Other    Hypertension Other    Seizures Neg Hx    Migraines Neg Hx    Allergies  Allergen Reactions   Penicillins Other (See Comments)    Childhood reaction      Review of Systems  Constitutional:  Negative for fever and malaise/fatigue.  HENT:  Negative for congestion.   Eyes:  Negative for blurred vision.  Respiratory:  Negative for cough and shortness of breath.   Cardiovascular:  Negative for chest pain, palpitations and leg swelling.  Gastrointestinal:  Negative for abdominal pain, blood in stool, nausea and vomiting.  Genitourinary:  Negative for dysuria and frequency.  Musculoskeletal:  Negative for back pain and falls.  Skin:  Negative for rash.  Neurological:  Negative for dizziness, loss of consciousness and headaches.  Endo/Heme/Allergies:  Negative for environmental allergies.  Psychiatric/Behavioral:  Negative for depression. The patient is not nervous/anxious.       Objective:     BP 100/70 (BP Location: Right Arm, Patient Position: Sitting, Cuff Size: Normal)   Pulse 84   Temp 98.4 F (36.9 C) (Oral)   Resp 18   Ht 5\' 6"  (1.676 m)   Wt 144 lb 3.2 oz (65.4 kg)   SpO2 96%   BMI 23.27 kg/m  BP Readings from Last 3 Encounters:  06/08/23  100/70  04/10/23 110/70  02/08/23 108/82   Wt Readings from  Last 3 Encounters:  06/08/23 144 lb 3.2 oz (65.4 kg)  04/10/23 142 lb (64.4 kg)  02/08/23 135 lb (61.2 kg)   SpO2 Readings from Last 3 Encounters:  06/08/23 96%  04/10/23 96%  02/08/23 95%      Physical Exam Vitals and nursing note reviewed.  Constitutional:      General: She is not in acute distress.    Appearance: Normal appearance.  HENT:     Head: Normocephalic and atraumatic.     Right Ear: Tympanic membrane, ear canal and external ear normal. There is no impacted cerumen.     Left Ear: Tympanic membrane, ear canal and external ear normal. There is no impacted cerumen.     Nose: Nose normal. No congestion or rhinorrhea.     Right Sinus: No maxillary sinus tenderness or frontal sinus tenderness.     Left Sinus: No maxillary sinus tenderness or frontal sinus tenderness.     Mouth/Throat:     Mouth: Mucous membranes are moist.     Pharynx: Oropharynx is clear. No oropharyngeal exudate or posterior oropharyngeal erythema.  Eyes:     General: No scleral icterus.       Right eye: No discharge.        Left eye: No discharge.     Conjunctiva/sclera: Conjunctivae normal.  Cardiovascular:     Rate and Rhythm: Normal rate and regular rhythm.     Heart sounds: Normal heart sounds.  Pulmonary:     Effort: Pulmonary effort is normal. No respiratory distress.     Breath sounds: Normal breath sounds.  Musculoskeletal:     Cervical back: Normal range of motion.  Lymphadenopathy:     Cervical: No cervical adenopathy.  Skin:    General: Skin is warm and dry.  Neurological:     Mental Status: She is alert and oriented to person, place, and time.  Psychiatric:        Mood and Affect: Mood normal.        Behavior: Behavior normal.        Thought Content: Thought content normal.        Judgment: Judgment normal.      No results found for any visits on 06/08/23.  Last CBC Lab Results  Component Value Date   WBC  5.8 02/08/2023   HGB 12.8 02/08/2023   HCT 38.9 02/08/2023   MCV 89.0 02/08/2023   MCH 29.3 02/08/2023   RDW 12.4 02/08/2023   PLT 314 02/08/2023   Last metabolic panel Lab Results  Component Value Date   GLUCOSE 95 02/08/2023   NA 137 02/08/2023   K 4.9 02/08/2023   CL 103 02/08/2023   CO2 26 02/08/2023   BUN 16 02/08/2023   CREATININE 1.05 (H) 02/08/2023   GFRNONAA >60 02/08/2023   CALCIUM 9.8 02/08/2023   PROT 7.4 02/05/2023   ALBUMIN 4.2 02/05/2023   BILITOT 0.4 02/05/2023   ALKPHOS 46 02/05/2023   AST 26 02/05/2023   ALT 21 02/05/2023   ANIONGAP 8 02/08/2023   Last lipids Lab Results  Component Value Date   CHOL 115 02/01/2023   HDL 33.80 (L) 02/01/2023   LDLCALC 65 02/01/2023   LDLDIRECT 90.0 03/17/2020   TRIG 80.0 02/01/2023   CHOLHDL 3 02/01/2023   Last hemoglobin A1c Lab Results  Component Value Date   HGBA1C 5.6 12/02/2013   Last thyroid functions Lab Results  Component Value Date   TSH 0.74 02/01/2023   Last vitamin D Lab Results  Component Value Date   VD25OH 10.51 (L) 02/05/2023   Last vitamin B12 and Folate Lab Results  Component Value Date   VITAMINB12 320 02/05/2023      The ASCVD Risk score (Arnett DK, et al., 2019) failed to calculate for the following reasons:   The valid total cholesterol range is 130 to 320 mg/dL    Assessment & Plan:   Problem List Items Addressed This Visit       Unprioritized   Hyperlipidemia - Primary    Encourage heart healthy diet such as MIND or DASH diet, increase exercise, avoid trans fats, simple carbohydrates and processed foods, consider a krill or fish or flaxseed oil cap daily.        Relevant Medications   fenofibrate 160 MG tablet   Other Relevant Orders   CBC with Differential/Platelet   Comprehensive metabolic panel   HTN (hypertension)    Well controlled, no changes to meds. Encouraged heart healthy diet such as the DASH diet and exercise as tolerated.        Relevant  Medications   fenofibrate 160 MG tablet   Other Relevant Orders   CBC with Differential/Platelet   Comprehensive metabolic panel   Other Visit Diagnoses     Need for influenza vaccination       Relevant Orders   Flu vaccine trivalent PF, 6mos and older(Flulaval,Afluria,Fluarix,Fluzone) (Completed)      Assessment and Plan    Medication Refill Patient reports no current health issues but requires a refill of an unspecified medication. -Send prescription refill to [facility].  Influenza Vaccination Patient has not received this year's flu shot. -Administer influenza vaccine today.  General Health Maintenance / Followup Plans No current health issues reported. Last physical was six months ago. -Schedule a six-month follow-up visit. -Order labs today to avoid an additional visit.          Return in about 6 months (around 12/07/2023), or if symptoms worsen or fail to improve, for annual exam, fasting.    [name], DO

## 2023-06-08 NOTE — Assessment & Plan Note (Signed)
Encourage heart healthy diet such as MIND or DASH diet, increase exercise, avoid trans fats, simple carbohydrates and processed foods, consider a krill or fish or flaxseed oil cap daily.  °

## 2023-06-08 NOTE — Patient Instructions (Signed)

## 2023-06-09 LAB — COMPREHENSIVE METABOLIC PANEL
AG Ratio: 1.4 (calc) (ref 1.0–2.5)
ALT: 19 U/L (ref 6–29)
AST: 21 U/L (ref 10–35)
Albumin: 4.2 g/dL (ref 3.6–5.1)
Alkaline phosphatase (APISO): 59 U/L (ref 37–153)
BUN: 18 mg/dL (ref 7–25)
CO2: 26 mmol/L (ref 20–32)
Calcium: 10 mg/dL (ref 8.6–10.4)
Chloride: 104 mmol/L (ref 98–110)
Creat: 1.03 mg/dL (ref 0.50–1.03)
Globulin: 2.9 g/dL (ref 1.9–3.7)
Glucose, Bld: 80 mg/dL (ref 65–99)
Potassium: 4.6 mmol/L (ref 3.5–5.3)
Sodium: 139 mmol/L (ref 135–146)
Total Bilirubin: 0.2 mg/dL (ref 0.2–1.2)
Total Protein: 7.1 g/dL (ref 6.1–8.1)

## 2023-06-09 LAB — CBC WITH DIFFERENTIAL/PLATELET
Absolute Monocytes: 602 {cells}/uL (ref 200–950)
Basophils Absolute: 53 {cells}/uL (ref 0–200)
Basophils Relative: 0.9 %
Eosinophils Absolute: 431 {cells}/uL (ref 15–500)
Eosinophils Relative: 7.3 %
HCT: 37.5 % (ref 35.0–45.0)
Hemoglobin: 12.3 g/dL (ref 11.7–15.5)
Lymphs Abs: 2425 {cells}/uL (ref 850–3900)
MCH: 29.6 pg (ref 27.0–33.0)
MCHC: 32.8 g/dL (ref 32.0–36.0)
MCV: 90.4 fL (ref 80.0–100.0)
MPV: 11.7 fL (ref 7.5–12.5)
Monocytes Relative: 10.2 %
Neutro Abs: 2390 {cells}/uL (ref 1500–7800)
Neutrophils Relative %: 40.5 %
Platelets: 270 10*3/uL (ref 140–400)
RBC: 4.15 10*6/uL (ref 3.80–5.10)
RDW: 12.3 % (ref 11.0–15.0)
Total Lymphocyte: 41.1 %
WBC: 5.9 10*3/uL (ref 3.8–10.8)

## 2023-07-11 ENCOUNTER — Ambulatory Visit (HOSPITAL_BASED_OUTPATIENT_CLINIC_OR_DEPARTMENT_OTHER)
Admission: RE | Admit: 2023-07-11 | Discharge: 2023-07-11 | Disposition: A | Discharge status: Home / Self Care | Payer: Commercial Managed Care - PPO | Source: Ambulatory Visit | Attending: Family | Admitting: Family

## 2023-07-11 ENCOUNTER — Ambulatory Visit: Payer: Commercial Managed Care - PPO | Admitting: Family

## 2023-07-11 VITALS — BP 123/73 | HR 102 | Temp 98.2°F | Resp 16 | Wt 141.0 lb

## 2023-07-11 DIAGNOSIS — B349 Viral infection, unspecified: Secondary | ICD-10-CM | POA: Insufficient documentation

## 2023-07-11 DIAGNOSIS — R5383 Other fatigue: Secondary | ICD-10-CM

## 2023-07-11 DIAGNOSIS — R059 Cough, unspecified: Secondary | ICD-10-CM | POA: Diagnosis not present

## 2023-07-11 LAB — COMPREHENSIVE METABOLIC PANEL
ALT: 17 U/L (ref 0–35)
AST: 21 U/L (ref 0–37)
Albumin: 4.2 g/dL (ref 3.5–5.2)
Alkaline Phosphatase: 50 U/L (ref 39–117)
BUN: 16 mg/dL (ref 6–23)
CO2: 27 meq/L (ref 19–32)
Calcium: 9.6 mg/dL (ref 8.4–10.5)
Chloride: 102 meq/L (ref 96–112)
Creatinine, Ser: 1.03 mg/dL (ref 0.40–1.20)
GFR: 60.56 mL/min (ref >60.00)
Glucose, Bld: 112 mg/dL — ABNORMAL HIGH (ref 70–99)
Potassium: 3.9 meq/L (ref 3.5–5.1)
Sodium: 137 meq/L (ref 135–145)
Total Bilirubin: 0.4 mg/dL (ref 0.2–1.2)
Total Protein: 7.5 g/dL (ref 6.0–8.3)

## 2023-07-11 LAB — CBC WITH DIFFERENTIAL/PLATELET
Basophils Absolute: 0.1 10*3/uL (ref 0.0–0.1)
Basophils Relative: 1.1 % (ref 0.0–3.0)
Eosinophils Absolute: 0.1 10*3/uL (ref 0.0–0.7)
Eosinophils Relative: 2.3 % (ref 0.0–5.0)
HCT: 39.4 % (ref 36.0–46.0)
Hemoglobin: 12.9 g/dL (ref 12.0–15.0)
Lymphocytes Relative: 26.2 % (ref 12.0–46.0)
Lymphs Abs: 1.5 10*3/uL (ref 0.7–4.0)
MCHC: 32.8 g/dL (ref 30.0–36.0)
MCV: 90 fL (ref 78.0–100.0)
Monocytes Absolute: 0.4 10*3/uL (ref 0.1–1.0)
Monocytes Relative: 6.8 % (ref 3.0–12.0)
Neutro Abs: 3.7 10*3/uL (ref 1.4–7.7)
Neutrophils Relative %: 63.6 % (ref 43.0–77.0)
Platelets: 315 10*3/uL (ref 150.0–400.0)
RBC: 4.38 Mil/uL (ref 3.87–5.11)
RDW: 13.7 % (ref 11.5–15.5)
WBC: 5.8 10*3/uL (ref 4.0–10.5)

## 2023-07-11 LAB — TSH: TSH: 0.56 u[IU]/mL (ref 0.35–5.50)

## 2023-07-11 NOTE — Progress Notes (Signed)
Subjective:     Patient ID: [name], female    DOB: 1966/01/27, 57 y.o.   MRN: [id number]  Chief Complaint  Patient presents with   Cough    Complains of persistent cough for about a week.    Dizziness    Complains of dizziness since yesterday   Fatigue    Complains of fatigue that started yesterday    Cough Associated symptoms include headaches. Pertinent negatives include no chest pain or ear pain.  Dizziness Associated symptoms include congestion, coughing and headaches. Pertinent negatives include no chest pain, nausea or vomiting.    Discussed the use of AI scribe software for clinical note transcription with the patient, who gave verbal consent to proceed.    Patient is a 57 year old female that presents to the clinic today with symptoms of a cold. She states that she caught a cold last week and on Sunday it started to be a chest cold.   Coughing so hard she started to have a pain in her head on the right side. She states that she feels a fullness on the left side of her head. She states she is worried that it could be something more serious in regards to the pain. She is no longer having pain in her head.   About 2 months ago she started felling dizzy, nauseous, fatigued. She states she has been laying in bed a lot recelty because that is the only thing that really does help with all of her symptoms.   Was given fluids and she felt better. But she states that she is starting to feel that way again.   Denies fever at this time  Cough is productive. Sounds deep and wet.      Health Maintenance Due  Topic Date Due   Colonoscopy  Never done   COVID-19 Vaccine (3 - Pfizer risk series) 02/13/2020    Past Medical History:  Diagnosis Date   Anxiety    Breast cancer (HCC) 1999   2004   Fatigue    Headache    History of exercise stress test    GXT 9/19:  Exercised 5'48"; METs: 7; Normal BP response; No ischemic EKG changes   Hyperlipidemia     Lightheadedness    Numbness    Tobacco abuse     Past Surgical History:  Procedure Laterality Date   ABDOMINAL HYSTERECTOMY  2006   TAH,BSO secondary bleeding and breast cancer--   BREAST SURGERY     CORNEAL TRANSPLANT Bilateral    6x, started in 1999, last was 2006.   EYE SURGERY     MASTECTOMY     bilateral---1999/2004    Family History  Problem Relation Age of Onset   Aortic aneurysm Mother    Stroke Sister 53       after starting OCP   Breast cancer Sister    Diabetes Sister    Hyperlipidemia Sister    Diabetes Mellitus I Sister    Multiple sclerosis Nephew    Aortic aneurysm Other    Hypertension Other    Seizures Neg Hx    Migraines Neg Hx     Social History   Socioeconomic History   Marital status: Single    Spouse name: Not on file   Number of children: 1   Years of education: 16   Highest education level: Not on file  Occupational History   Occupation: works for [employer]; office specialist  Tobacco Use  Smoking status: Some Days    Current packs/day: 0.25    Average packs/day: 0.3 packs/day for 27.0 years (6.8 ttl pk-yrs)    Types: Cigarettes   Smokeless tobacco: Never   Tobacco comments:    Trying to quit  Vaping Use   Vaping status: Never Used  Substance and Sexual Activity   Alcohol use: Yes    Alcohol/week: 0.0 standard drinks of alcohol    Comment: Rare   Drug use: No   Sexual activity: Not Currently  Other Topics Concern   Not on file  Social History Narrative   Marital status: single      Children: one daughter      Lives: with sister      Employment: works for [employer]; office specialist      Education: Pharmacologist      Tobacco: trying to quit      Alcohol: none; rare      Drugs: none      Exercise: walking daily 2 miles      Caffeine use: Tea/pepsi- 1 pepsi/morning      Tea- 3 cups per day   Right-handed   Social Determinants of Health   Financial Resource Strain: Not on file   Food Insecurity: Not on file  Transportation Needs: Not on file  Physical Activity: Not on file  Stress: Not on file  Social Connections: Not on file  Intimate Partner Violence: Not on file    Outpatient Medications Prior to Visit  Medication Sig Dispense Refill   acyclovir (ZOVIRAX) 400 MG tablet TAKE 1 TABLET(400 MG) BY MOUTH TWICE DAILY 180 tablet 0   aspirin 81 MG tablet Take 1 tablet (81 mg total) by mouth daily. 30 tablet    atorvastatin (LIPITOR) 10 MG tablet Take 1 tablet (10 mg total) by mouth daily. 90 tablet 1   escitalopram (LEXAPRO) 10 MG tablet Take 1 tablet (10 mg total) by mouth daily. 30 tablet 2   fenofibrate 160 MG tablet TAKE 1 TABLET(160 MG) BY MOUTH DAILY 90 tablet 1   metoprolol succinate (TOPROL-XL) 50 MG 24 hr tablet Take 1 tablet (50 mg total) by mouth daily. Take with or immediately following a meal 90 tablet 1   ondansetron (ZOFRAN-ODT) 4 MG disintegrating tablet Take 1 tablet (4 mg total) by mouth every 8 (eight) hours as needed for nausea or vomiting. 20 tablet 0   ALPRAZolam (XANAX) 0.25 MG tablet Take 1 tablet (0.25 mg total) by mouth 2 (two) times daily as needed. for anxiety (Patient not taking: Reported on 07/11/2023) 15 tablet 0   Vitamin D, Ergocalciferol, (DRISDOL) 1.25 MG (50000 UNIT) CAPS capsule Take 1 capsule (50,000 Units total) by mouth every 7 (seven) days. 12 capsule 1   No facility-administered medications prior to visit.    Allergies  Allergen Reactions   Penicillins Other (See Comments)    Childhood reaction    Review of Systems  HENT:  Positive for congestion. Negative for ear pain and tinnitus.   Eyes:  Negative for photophobia and pain.  Respiratory:  Positive for cough.   Cardiovascular:  Negative for chest pain and claudication.  Gastrointestinal:  Negative for nausea and vomiting.  Genitourinary:  Negative for urgency.  Neurological:  Positive for dizziness and headaches.  Psychiatric/Behavioral:  Negative for depression.         Objective:    Physical Exam Vitals reviewed.  Constitutional:      Appearance: Normal appearance. She is normal weight.  HENT:     Head: Normocephalic.     Nose: Congestion and rhinorrhea present.  Cardiovascular:     Rate and Rhythm: Normal rate and regular rhythm.     Pulses: Normal pulses.     Heart sounds: Normal heart sounds.  Pulmonary:     Breath sounds: Wheezing present.  Musculoskeletal:        General: Normal range of motion.  Skin:    General: Skin is warm.     Capillary Refill: Capillary refill takes less than 2 seconds.  Neurological:     General: No focal deficit present.     Mental Status: She is alert and oriented to person, place, and time. Mental status is at baseline.  Psychiatric:        Mood and Affect: Mood normal.        Behavior: Behavior normal.        Thought Content: Thought content normal.        Judgment: Judgment normal.      BP 123/73 (BP Location: Right Arm, Patient Position: Sitting, Cuff Size: Small)   Pulse (!) 102   Temp 98.2 F (36.8 C) (Oral)   Resp 16   Wt 141 lb (64 kg)   SpO2 98%   BMI 22.76 kg/m  Wt Readings from Last 3 Encounters:  07/11/23 141 lb (64 kg)  06/08/23 144 lb 3.2 oz (65.4 kg)  04/10/23 142 lb (64.4 kg)       Assessment & Plan:   Problem List Items Addressed This Visit   None   I have discontinued Lonita L. Munns's Vitamin D (Ergocalciferol). I am also having her maintain her aspirin, acyclovir, ALPRAZolam, escitalopram, ondansetron, metoprolol succinate, atorvastatin, and fenofibrate.  No orders of the defined types were placed in this encounter.

## 2023-07-11 NOTE — Patient Instructions (Signed)
VISIT SUMMARY:  During your visit, we discussed your recent symptoms of fatigue, dizziness, nausea, and a headache. You also mentioned a sensation of pressure in your head. We considered your history of breast cancer and chemotherapy, as well as your recent cold that has progressed to a chest cold with coughing.  YOUR PLAN:  -ACUTE ILLNESS: Your symptoms of fatigue, dizziness, nausea, and headache are most likely due to a viral illness. Covid and Flu testing are negative.  Will check some basic blood tests. It's important to rest, stay hydrated, and you can use Tylenol or Motrin if needed.  -GENERAL HEALTH MAINTENANCE: Once you have recovered from your current illness, we recommend considering a COVID booster shot to help protect your health.  INSTRUCTIONS:  Please ensure to rest, stay hydrated, and take Tylenol or Motrin as needed for any discomfort. We will be conducting tests for COVID and flu, as well as some basic blood tests. Once you have recovered from your current illness, please consider getting a COVID booster shot.

## 2023-07-11 NOTE — Assessment & Plan Note (Addendum)
  Recent onset of fatigue, dizziness, nausea, and headache. No fever, vomiting, or diarrhea. Adequate hydration reported. History of chemotherapy for breast cancer. -Test for COVID and flu are negative today. -Order baseline labs. -rule out PNA with CXR.  -she is concerned about the possibility of a brain aneurysm.  She denies current headache or neuro symptoms other than lightheadedness. Advised her aneurysm is unlikely but to let us know if she develop severe headache. -Advise rest, hydration, and use of Tylenol or Motrin as needed. Call if new/worsening symptoms or if symptoms are not improved in 1-2 days.

## 2023-07-11 NOTE — Progress Notes (Signed)
Subjective:     Patient ID: [name], female    DOB: [birth year], 57 y.o.   MRN: [id number]  Chief Complaint  Patient presents with   Cough    Complains of persistent cough for about a week.    Dizziness    Complains of dizziness since yesterday   Fatigue    Complains of fatigue that started yesterday    Cough  Dizziness Associated symptoms include coughing.    Discussed the use of AI scribe software for clinical note transcription with the patient, who gave verbal consent to proceed.  History of Present Illness   The patient, with a history of breast cancer treated with chemotherapy back in 1999, presents with fatigue, dizziness, and a headache. She reports a recent cold that started last week, which progressed to a chest cold with coughing that started Sunday. During a coughing fit, she experienced a sharp, transient pain in her head. Despite these symptoms, she continued to work and maintain her normal activities, including eating and drinking. However, by Monday, she began to feel extremely fatigued and dizzy, with accompanying nausea. She likens this fatigue to the exhaustion she experienced during chemotherapy. She denies fever, pain, and vomiting, but reports a sensation of fullness in her head. She has been hydrating well at home, consuming a significant amount of water and Gatorade. She has a history of similar symptoms this past, for which she sought medical attention multiple times, including visits to urgent care and the emergency room. Work up was unremarkable at that time. During one of these visits, she received intravenous fluids, which seemed to help. She is concerned about the possibility of an aneurysm or electrolyte imbalance.          Health Maintenance Due  Topic Date Due   Colonoscopy  Never done   COVID-19 Vaccine (3 - Pfizer risk series) 02/13/2020    Past Medical History:  Diagnosis Date   Anxiety    Breast cancer (HCC) 1999   2004   Fatigue     Headache    History of exercise stress test    GXT 9/19:  Exercised 5'48"; METs: 7; Normal BP response; No ischemic EKG changes   Hyperlipidemia    Lightheadedness    Numbness    Tobacco abuse     Past Surgical History:  Procedure Laterality Date   ABDOMINAL HYSTERECTOMY  2006   TAH,BSO secondary bleeding and breast cancer--   BREAST SURGERY     CORNEAL TRANSPLANT Bilateral    6x, started in 1999, last was 2006.   EYE SURGERY     MASTECTOMY     bilateral---1999/2004    Family History  Problem Relation Age of Onset   Aortic aneurysm Mother    Stroke Sister 15       after starting OCP   Breast cancer Sister    Diabetes Sister    Hyperlipidemia Sister    Diabetes Mellitus I Sister    Multiple sclerosis Nephew    Aortic aneurysm Other    Hypertension Other    Seizures Neg Hx    Migraines Neg Hx     Social History   Socioeconomic History   Marital status: Single    Spouse name: Not on file   Number of children: 1   Years of education: 16   Highest education level: Not on file  Occupational History   Occupation: works for [employer]; office specialist  Tobacco Use   Smoking status: Some  Days    Current packs/day: 0.25    Average packs/day: 0.3 packs/day for 27.0 years (6.8 ttl pk-yrs)    Types: Cigarettes   Smokeless tobacco: Never   Tobacco comments:    Trying to quit  Vaping Use   Vaping status: Never Used  Substance and Sexual Activity   Alcohol use: Yes    Alcohol/week: 0.0 standard drinks of alcohol    Comment: Rare   Drug use: No   Sexual activity: Not Currently  Other Topics Concern   Not on file  Social History Narrative   Marital status: single      Children: one daughter      Lives: with sister      Employment: works for [employer]; office specialist      Education: Pharmacologist      Tobacco: trying to quit      Alcohol: none; rare      Drugs: none      Exercise: walking daily 2 miles      Caffeine  use: Tea/pepsi- 1 pepsi/morning      Tea- 3 cups per day   Right-handed   Social Determinants of Health   Financial Resource Strain: Not on file  Food Insecurity: Not on file  Transportation Needs: Not on file  Physical Activity: Not on file  Stress: Not on file  Social Connections: Not on file  Intimate Partner Violence: Not on file    Outpatient Medications Prior to Visit  Medication Sig Dispense Refill   acyclovir (ZOVIRAX) 400 MG tablet TAKE 1 TABLET(400 MG) BY MOUTH TWICE DAILY 180 tablet 0   aspirin 81 MG tablet Take 1 tablet (81 mg total) by mouth daily. 30 tablet    atorvastatin (LIPITOR) 10 MG tablet Take 1 tablet (10 mg total) by mouth daily. 90 tablet 1   escitalopram (LEXAPRO) 10 MG tablet Take 1 tablet (10 mg total) by mouth daily. 30 tablet 2   fenofibrate 160 MG tablet TAKE 1 TABLET(160 MG) BY MOUTH DAILY 90 tablet 1   metoprolol succinate (TOPROL-XL) 50 MG 24 hr tablet Take 1 tablet (50 mg total) by mouth daily. Take with or immediately following a meal 90 tablet 1   ondansetron (ZOFRAN-ODT) 4 MG disintegrating tablet Take 1 tablet (4 mg total) by mouth every 8 (eight) hours as needed for nausea or vomiting. 20 tablet 0   ALPRAZolam (XANAX) 0.25 MG tablet Take 1 tablet (0.25 mg total) by mouth 2 (two) times daily as needed. for anxiety (Patient not taking: Reported on 07/11/2023) 15 tablet 0   Vitamin D, Ergocalciferol, (DRISDOL) 1.25 MG (50000 UNIT) CAPS capsule Take 1 capsule (50,000 Units total) by mouth every 7 (seven) days. 12 capsule 1   No facility-administered medications prior to visit.    Allergies  Allergen Reactions   Penicillins Other (See Comments)    Childhood reaction    Review of Systems  Respiratory:  Positive for cough.   Neurological:  Positive for dizziness.       Objective:    Physical Exam Constitutional:      General: She is not in acute distress.    Appearance: Normal appearance. She is well-developed.  HENT:     Head:  Normocephalic and atraumatic.     Right Ear: External ear normal.     Left Ear: External ear normal.  Eyes:     General: No scleral icterus. Neck:     Thyroid: No thyromegaly.  Cardiovascular:  Rate and Rhythm: Normal rate and regular rhythm.     Heart sounds: Normal heart sounds. No murmur heard. Pulmonary:     Effort: Pulmonary effort is normal. No respiratory distress.     Breath sounds: Normal breath sounds. No wheezing.     Comments: Coarse upper airway rhonchi Musculoskeletal:     Cervical back: Neck supple.  Lymphadenopathy:     Cervical: Cervical adenopathy present.  Skin:    General: Skin is warm and dry.  Neurological:     General: No focal deficit present.     Mental Status: She is alert and oriented to person, place, and time.     Comments: Speech is clear EOM intact + facial symmetry  Psychiatric:        Mood and Affect: Mood normal.        Behavior: Behavior normal.        Thought Content: Thought content normal.        Judgment: Judgment normal.      BP 123/73 (BP Location: Right Arm, Patient Position: Sitting, Cuff Size: Small)   Pulse (!) 102   Temp 98.2 F (36.8 C) (Oral)   Resp 16   Wt 141 lb (64 kg)   SpO2 98%   BMI 22.76 kg/m  Wt Readings from Last 3 Encounters:  07/11/23 141 lb (64 kg)  06/08/23 144 lb 3.2 oz (65.4 kg)  04/10/23 142 lb (64.4 kg)       Assessment & Plan:   Problem List Items Addressed This Visit       Unprioritized   Viral illness     Recent onset of fatigue, dizziness, nausea, and headache. No fever, vomiting, or diarrhea. Adequate hydration reported. History of chemotherapy for breast cancer. -Test for COVID and flu are negative today. -Order baseline labs. -rule out PNA with CXR.  -she is concerned about the possibility of a brain aneurysm.  She denies current headache or neuro symptoms other than lightheadedness. Advised her aneurysm is unlikely but to let us know if she develop severe headache. -Advise rest,  hydration, and use of Tylenol or Motrin as needed. Call if new/worsening symptoms or if symptoms are not improved in 1-2 days.        Other Visit Diagnoses     Fatigue, unspecified type    -  Primary   Relevant Orders   Comp Met (CMET)   TSH   CBC w/Diff   Cough, unspecified type       Relevant Orders   DG Chest 2 View       I have discontinued Lezli L. Koman's Vitamin D (Ergocalciferol). I am also having her maintain her aspirin, acyclovir, ALPRAZolam, escitalopram, ondansetron, metoprolol succinate, atorvastatin, and fenofibrate.  No orders of the defined types were placed in this encounter.

## 2023-07-20 ENCOUNTER — Encounter: Payer: Self-pay | Admitting: Family Medicine

## 2023-07-20 ENCOUNTER — Ambulatory Visit: Payer: Commercial Managed Care - PPO | Admitting: Family Medicine

## 2023-07-20 VITALS — BP 106/78 | HR 73 | Temp 98.3°F | Resp 18 | Ht 66.0 in | Wt 146.0 lb

## 2023-07-20 DIAGNOSIS — R051 Acute cough: Secondary | ICD-10-CM

## 2023-07-20 MED ORDER — BENZONATATE 200 MG PO CAPS
200.0000 mg | ORAL_CAPSULE | Freq: Two times a day (BID) | ORAL | 0 refills | Status: DC | PRN
Start: 2023-07-20 — End: 2024-01-02

## 2023-07-20 NOTE — Progress Notes (Signed)
Established Patient Office Visit  Subjective   Patient ID: [name], female    DOB: 07/17/1966  Age: 57 y.o. MRN: [id number]  Chief Complaint  Patient presents with   Follow-up    F/u from last week with Melissa. Patient states she is feeling fine. Fatigue is gone.    HPI Discussed the use of AI scribe software for clinical note transcription with the patient, who gave verbal consent to proceed.  History of Present Illness   The patient, with a history of anxiety, presents with a persistent cough that has been ongoing for at least two weeks. The cough is productive with clear sputum and is worse at night. She has not taken any over-the-counter medications for the cough due to concerns about interactions with her blood pressure and cholesterol medications. She denies wheezing and heartburn.  The patient also reports a history of left-sided hypersensitivity that occurs only when she is sick. The hypersensitivity is not constant but moves around, affecting different parts of the left side of her body. She has not seen her neurologist in over three years.  The patient's anxiety is well-controlled on Lexapro, which she takes daily. She reports that her anxiety worsens when she is sick or when there is a health crisis in her family. She feels a lot of pressure to take care of her family members, which contributes to her stress.      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/10/2018   Chronic pain of right ankle 07/26/2018   Osteochondral defect of talus 06/11/2018   Lightheadedness 08/14/2016   Enlarged pituitary gland (HCC) 08/08/2016   Generalized anxiety disorder 11/02/2015    Facial droop 12/01/2013   Sensory disturbance 12/01/2013   Trichomonas contact, treated 08/05/2013   FLANK PAIN, LEFT 11/22/2010   TOBACCO USE 10/11/2010   BREAST CANCER, HX OF 10/11/2010   Past Medical History:  Diagnosis Date   Anxiety    Breast cancer (HCC) 1999   2004   Fatigue    Headache    History of exercise stress test    GXT 9/19:  Exercised 5'48"; METs: 7; Normal BP response; No ischemic EKG changes   Hyperlipidemia    Lightheadedness    Numbness    Tobacco abuse    Past Surgical History:  Procedure Laterality Date   ABDOMINAL HYSTERECTOMY  2006   TAH,BSO secondary bleeding and breast cancer--   BREAST SURGERY     CORNEAL TRANSPLANT Bilateral    6x, started in 1999, last was 2006.   EYE SURGERY     MASTECTOMY     bilateral---1999/2004   Social History   Tobacco Use   Smoking status: Some Days    Current packs/day: 0.25    Average packs/day: 0.3 packs/day for 27.0 years (6.8 ttl pk-yrs)    Types: Cigarettes   Smokeless tobacco: Never   Tobacco comments:    Trying to quit  Vaping Use   Vaping status: Never Used  Substance Use Topics   Alcohol use: Yes    Alcohol/week: 0.0 standard drinks of alcohol    Comment: Rare   Drug use: No   Social History   Socioeconomic History   Marital status: Single  Spouse name: Not on file   Number of children: 1   Years of education: 96   Highest education level: Not on file  Occupational History   Occupation: works for [employer]; office specialist  Tobacco Use   Smoking status: Some Days    Current packs/day: 0.25    Average packs/day: 0.3 packs/day for 27.0 years (6.8 ttl pk-yrs)    Types: Cigarettes   Smokeless tobacco: Never   Tobacco comments:    Trying to quit  Vaping Use   Vaping status: Never Used  Substance and Sexual Activity   Alcohol use: Yes    Alcohol/week: 0.0 standard drinks of alcohol    Comment: Rare   Drug use: No   Sexual activity: Not Currently  Other Topics Concern    Not on file  Social History Narrative   Marital status: single      Children: one daughter      Lives: with sister      Employment: works for [employer]; office specialist      Education: Pharmacologist      Tobacco: trying to quit      Alcohol: none; rare      Drugs: none      Exercise: walking daily 2 miles      Caffeine use: Tea/pepsi- 1 pepsi/morning      Tea- 3 cups per day   Right-handed   Social Determinants of Health   Financial Resource Strain: Not on file  Food Insecurity: Not on file  Transportation Needs: Not on file  Physical Activity: Not on file  Stress: Not on file  Social Connections: Not on file  Intimate Partner Violence: Not on file   Family Status  Relation Name Status   Mother  Deceased at age 67       brain aneurysm   Father  Deceased at age 50       homicide   Sister  Alive   Sister  Alive   Sister  Alive       57   MGM  Deceased   MGF  Deceased   PGM  Deceased   PGF  Deceased   Education administrator   Other  (Not Specified)   Neg Hx  (Not Specified)  No partnership data on file   Family History  Problem Relation Age of Onset   Aortic aneurysm Mother    Stroke Sister 73       after starting OCP   Breast cancer Sister    Diabetes Sister    Hyperlipidemia Sister    Diabetes Mellitus I Sister    Multiple sclerosis Nephew    Aortic aneurysm Other    Hypertension Other    Seizures Neg Hx    Migraines Neg Hx    Allergies  Allergen Reactions   Penicillins Other (See Comments)    Childhood reaction      Review of Systems  Constitutional:  Negative for fever and malaise/fatigue.  HENT:  Negative for congestion.   Eyes:  Negative for blurred vision.  Respiratory:  Positive for cough and sputum production. Negative for shortness of breath.   Cardiovascular:  Negative for chest pain, palpitations and leg swelling.  Gastrointestinal:  Negative for vomiting.  Musculoskeletal:  Negative for back pain.  Skin:   Negative for rash.  Neurological:  Negative for loss of consciousness and headaches.      Objective:     BP 106/78   Pulse  73   Temp 98.3 F (36.8 C)   Resp 18   Ht 5\' 6"  (1.676 m)   Wt 146 lb (66.2 kg)   BMI 23.57 kg/m  BP Readings from Last 3 Encounters:  07/20/23 106/78  07/11/23 123/73  06/08/23 100/70   Wt Readings from Last 3 Encounters:  07/20/23 146 lb (66.2 kg)  07/11/23 141 lb (64 kg)  06/08/23 144 lb 3.2 oz (65.4 kg)   SpO2 Readings from Last 3 Encounters:  07/11/23 98%  06/08/23 96%  04/10/23 96%      Physical Exam Vitals and nursing note reviewed.  Constitutional:      General: She is not in acute distress.    Appearance: Normal appearance. She is well-developed.  HENT:     Head: Normocephalic and atraumatic.     Right Ear: Tympanic membrane, ear canal and external ear normal. There is no impacted cerumen.     Left Ear: Tympanic membrane, ear canal and external ear normal. There is no impacted cerumen.     Nose: Nose normal.     Mouth/Throat:     Mouth: Mucous membranes are moist.     Pharynx: Oropharynx is clear. No oropharyngeal exudate or posterior oropharyngeal erythema.  Eyes:     General: No scleral icterus.       Right eye: No discharge.        Left eye: No discharge.     Conjunctiva/sclera: Conjunctivae normal.     Pupils: Pupils are equal, round, and reactive to light.  Neck:     Thyroid: No thyromegaly or thyroid tenderness.     Vascular: No JVD.  Cardiovascular:     Rate and Rhythm: Normal rate and regular rhythm.     Heart sounds: Normal heart sounds. No murmur heard. Pulmonary:     Effort: Pulmonary effort is normal. No respiratory distress.     Breath sounds: Normal breath sounds.  Abdominal:     General: Bowel sounds are normal. There is no distension.     Palpations: Abdomen is soft. There is no mass.     Tenderness: There is no abdominal tenderness. There is no guarding or rebound.  Genitourinary:    Vagina: Normal.   Musculoskeletal:        General: Normal range of motion.     Cervical back: Normal range of motion and neck supple.     Right lower leg: No edema.     Left lower leg: No edema.  Lymphadenopathy:     Cervical: No cervical adenopathy.  Skin:    General: Skin is warm and dry.     Findings: No erythema or rash.  Neurological:     Mental Status: She is alert and oriented to person, place, and time.     Cranial Nerves: No cranial nerve deficit.     Deep Tendon Reflexes: Reflexes are normal and symmetric.  Psychiatric:        Mood and Affect: Mood normal.        Behavior: Behavior normal.        Thought Content: Thought content normal.        Judgment: Judgment normal.      No results found for any visits on 07/20/23.  Last CBC Lab Results  Component Value Date   WBC 5.8 07/11/2023   HGB 12.9 07/11/2023   HCT 39.4 07/11/2023   MCV 90.0 07/11/2023   MCH 29.6 06/08/2023   RDW 13.7 07/11/2023   PLT 315.0 07/11/2023  Last metabolic panel Lab Results  Component Value Date   GLUCOSE 112 (H) 07/11/2023   NA 137 07/11/2023   K 3.9 07/11/2023   CL 102 07/11/2023   CO2 27 07/11/2023   BUN 16 07/11/2023   CREATININE 1.03 07/11/2023   GFR 60.56 07/11/2023   CALCIUM 9.6 07/11/2023   PROT 7.5 07/11/2023   ALBUMIN 4.2 07/11/2023   BILITOT 0.4 07/11/2023   ALKPHOS 50 07/11/2023   AST 21 07/11/2023   ALT 17 07/11/2023   ANIONGAP 8 02/08/2023   Last lipids Lab Results  Component Value Date   CHOL 115 02/01/2023   HDL 33.80 (L) 02/01/2023   LDLCALC 65 02/01/2023   LDLDIRECT 90.0 03/17/2020   TRIG 80.0 02/01/2023   CHOLHDL 3 02/01/2023   Last hemoglobin A1c Lab Results  Component Value Date   HGBA1C 5.6 12/02/2013   Last thyroid functions Lab Results  Component Value Date   TSH 0.56 07/11/2023   Last vitamin D Lab Results  Component Value Date   VD25OH 10.51 (L) 02/05/2023   Last vitamin B12 and Folate Lab Results  Component Value Date   VITAMINB12 320  02/05/2023      The ASCVD Risk score (Arnett DK, et al., 2019) failed to calculate for the following reasons:   The valid total cholesterol range is 130 to 320 mg/dL    Assessment & Plan:   Problem List Items Addressed This Visit   None Visit Diagnoses     Acute cough    -  Primary   Relevant Medications   benzonatate (TESSALON) 200 MG capsule     Assessment and Plan    Upper Respiratory Infection Persistent cough for two weeks, worse at night. Clear sputum. No wheezing or other respiratory symptoms. No signs of sinusitis or GERD. -Start over-the-counter antihistamine (Claritin or Zyrtec). -Prescribe Tessalon Perles for cough.  Anxiety Reports increased sensitivity and tingling in left side of body when ill, causing anxiety. Currently on Lexapro. -Continue Lexapro as prescribed. -Consider increasing dose if anxiety worsens.  Neurological Sensitivity Reports hypersensitivity and tingling in left side of body when ill. Last seen by neurologist in 2018. -Consider re-referral to neurologist if symptoms persist or worsen.  General Health Maintenance -Flu shot already administered for the season.        No follow-ups on file.    [name], DO

## 2023-10-09 ENCOUNTER — Telehealth: Payer: Self-pay

## 2023-10-09 MED ORDER — ATORVASTATIN CALCIUM 10 MG PO TABS: 10.0000 mg | ORAL_TABLET | Freq: Every day | ORAL | 1 refills | Status: DC

## 2023-10-09 MED ORDER — METOPROLOL SUCCINATE ER 50 MG PO TB24: 50.0000 mg | ORAL_TABLET | Freq: Every day | ORAL | 1 refills | Status: DC

## 2023-10-09 NOTE — Addendum Note (Signed)
 Addended by: Roxanne Gates on: 10/09/2023 12:01 PM   Modules accepted: Orders

## 2023-10-09 NOTE — Telephone Encounter (Signed)
Rx sent 

## 2023-10-09 NOTE — Telephone Encounter (Signed)
 Copied from CRM [id number]. Topic: Clinical - Medication Refill >> Oct 09, 2023  8:45 AM Zacyrah J wrote: Most Recent Primary Care Visit:  Provider: ANTONIO CYNDEE ROCKERS R  Department: [facility]  Visit Type: OFFICE VISIT  Date: 07/20/2023  Medication: metoprolol  succinate (TOPROL -XL) 50 MG 24 hr tablet / atorvastatin  (LIPITOR) 10 MG tablet  Has the patient contacted their pharmacy? Yes (Agent: If no, request that the patient contact the pharmacy for the refill. If patient does not wish to contact the pharmacy document the reason why and proceed with request.) (Agent: If yes, when and what did the pharmacy advise?)  Is this the correct pharmacy for this prescription? Yes If no, delete pharmacy and type the correct one.  This is the patient's preferred pharmacy:  [id number] [place], [place] - [address] AT [facility] OF [address] [place] [address] Phone: [phone] Fax: [phone]  [facility] [address] [place] [address] Phone: [phone] Fax: [phone]   Has the prescription been filled recently? No  Is the patient out of the medication? Yes  Has the patient been seen for an appointment in the last year OR does the patient have an upcoming appointment? No  Can we respond through MyChart? Yes  Agent: Please be advised that Rx refills may take up to 3 business days. We ask that you follow-up with your pharmacy.

## 2023-12-18 ENCOUNTER — Other Ambulatory Visit: Payer: Self-pay | Admitting: Family Medicine

## 2023-12-18 DIAGNOSIS — E785 Hyperlipidemia, unspecified: Secondary | ICD-10-CM

## 2023-12-31 ENCOUNTER — Other Ambulatory Visit: Payer: Self-pay

## 2023-12-31 ENCOUNTER — Encounter: Payer: Self-pay | Admitting: Emergency Medicine

## 2023-12-31 ENCOUNTER — Emergency Department
Admission: EM | Admit: 2023-12-31 | Discharge: 2023-12-31 | Disposition: A | Discharge status: Home / Self Care | Attending: Emergency Medicine | Admitting: Emergency Medicine

## 2023-12-31 DIAGNOSIS — M79601 Pain in right arm: Secondary | ICD-10-CM | POA: Diagnosis present

## 2023-12-31 DIAGNOSIS — R6884 Jaw pain: Secondary | ICD-10-CM | POA: Insufficient documentation

## 2023-12-31 DIAGNOSIS — I10 Essential (primary) hypertension: Secondary | ICD-10-CM | POA: Insufficient documentation

## 2023-12-31 DIAGNOSIS — M5410 Radiculopathy, site unspecified: Secondary | ICD-10-CM | POA: Diagnosis not present

## 2023-12-31 DIAGNOSIS — Z7982 Long term (current) use of aspirin: Secondary | ICD-10-CM | POA: Diagnosis not present

## 2023-12-31 DIAGNOSIS — R590 Localized enlarged lymph nodes: Secondary | ICD-10-CM | POA: Diagnosis not present

## 2023-12-31 DIAGNOSIS — M792 Neuralgia and neuritis, unspecified: Secondary | ICD-10-CM

## 2023-12-31 LAB — BASIC METABOLIC PANEL
Anion gap: 10 (ref 5–15)
BUN: 16 mg/dL (ref 6–20)
CO2: 21 mmol/L — ABNORMAL LOW (ref 22–32)
Calcium: 9.7 mg/dL (ref 8.9–10.3)
Chloride: 108 mmol/L (ref 98–111)
Creatinine, Ser: 1.15 mg/dL — ABNORMAL HIGH (ref 0.44–1.00)
GFR, Estimated: 56 mL/min — ABNORMAL LOW (ref >60)
Glucose, Bld: 108 mg/dL — ABNORMAL HIGH (ref 70–99)
Potassium: 3.9 mmol/L (ref 3.5–5.1)
Sodium: 139 mmol/L (ref 135–145)

## 2023-12-31 LAB — CBC
HCT: 41.3 % (ref 36.0–46.0)
Hemoglobin: 13.4 g/dL (ref 12.0–15.0)
MCH: 30.1 pg (ref 26.0–34.0)
MCHC: 32.4 g/dL (ref 30.0–36.0)
MCV: 92.8 fL (ref 80.0–100.0)
Platelets: 309 10*3/uL (ref 150–400)
RBC: 4.45 MIL/uL (ref 3.87–5.11)
RDW: 13.2 % (ref 11.5–15.5)
WBC: 7 10*3/uL (ref 4.0–10.5)
nRBC: 0 % (ref 0.0–0.2)

## 2023-12-31 LAB — TROPONIN I (HIGH SENSITIVITY)
Troponin I (High Sensitivity): 3 ng/L (ref <18)
Troponin I (High Sensitivity): 3 ng/L (ref <18)

## 2023-12-31 MED ORDER — ASPIRIN 81 MG PO TBEC: 81.0000 mg | DELAYED_RELEASE_TABLET | Freq: Every day | ORAL | Status: DC

## 2023-12-31 NOTE — ED Provider Notes (Signed)
 [facility] Provider Note    Event Date/Time   First MD Initiated Contact with Patient 12/31/23 [phone]     (approximate)   History   Arm Pain   HPI  [name] is a 58 y.o. female with a history of chronic headaches, hyperlipidemia hypertension  Patient noticed about a week ago that she had a day or 2 where she felt a little nauseated thought maybe she had a slight virus that has gone away completely.  She has however it since then noticed that she will get a little bit of pain in her right jaw in the anterior part of her neck and sometimes it will feel like a pain in her right hand.  The pain and although symptoms are gone right now.  She reports she "googled" her symptoms and suggested that she should get her heart checked out  No shortness of breath no fevers or chills no nausea or vomiting except she felt a little nauseated for a day or 2 last week when this started but that is resolved  She reports she has had absolutely no chest pain.  She does take a baby aspirin daily and used to see Dr[facility] health cardiology but they moved out of area   No pain along the back of the neck.  Physical Exam   Triage Vital Signs: ED Triage Vitals  Encounter Vitals Group     BP 12/31/23 0916 137/88     Systolic BP Percentile --      Diastolic BP Percentile --      Pulse Rate 12/31/23 0916 71     Resp 12/31/23 0916 18     Temp 12/31/23 0916 98.5 F (36.9 C)     Temp Source 12/31/23 0916 Oral     SpO2 12/31/23 0916 96 %     Weight 12/31/23 0914 120 lb (54.4 kg)     Height 12/31/23 0914 5\' 6"  (1.676 m)     Head Circumference --      Peak Flow --      Pain Score 12/31/23 0914 6     Pain Loc --      Pain Education --      Exclude from Growth Chart --     Most recent vital signs: Vitals:   12/31/23 0916  BP: 137/88  Pulse: 71  Resp: 18  Temp: 98.5 F (36.9 C)  SpO2: 96%     General: Awake, no distress.  Normocephalic atraumatic No cervical  tenderness She has 1 slightly tender anterior cervical lymph node that she reports is the area that is causing her discomfort.  It appears to be mobile, the lymph node chain does not seem to be acutely enlarged.  There is no anterior neck swelling no erythema.  Dentition appears without acute abnormality CV:  Good peripheral perfusion.  Normal tones and rate Resp:  Normal effort.  Clear bilateral Abd:  No distention.  Other:  Right arm normal use of the right hand.  No areas of pain or discomfort.  Strong palpable pulse in the right radial.  Warm well-perfused fingers of all right hand and good use of all fingers and digits.   ED Results / Procedures / Treatments   Labs (all labs ordered are listed, but only abnormal results are displayed) Labs Reviewed  BASIC METABOLIC PANEL - Abnormal; Notable for the following components:      Result Value   CO2 21 (*)    Glucose,  Bld 108 (*)    Creatinine, Ser 1.15 (*)    GFR, Estimated 56 (*)    All other components within normal limits  CBC  TROPONIN I (HIGH SENSITIVITY)  TROPONIN I (HIGH SENSITIVITY)     EKG  And inter by me at 920 heart rate 85 QRS 70 QTc 410 Normal sinus rhythm, diffuse T wave abnormality including slight inversions and occasional depressions noted throughout.  There is also findings consistent with left ventricular hypertrophy  Cannot rule out ischemia  however T wave inversions appear similar nature to prior ECGs when comparing to February 03, 2023 as well as December 14, 2022.   RADIOLOGY  No associated chest discomfort.  Lung sounds clear oxygen saturation normal.   PROCEDURES:  Critical Care performed: No  Procedures   MEDICATIONS ORDERED IN ED: Medications - No data to display   IMPRESSION / MDM / ASSESSMENT AND PLAN / ED COURSE  I reviewed the triage vital signs and the nursing notes.                              Differential diagnosis includes, but is not limited to, possible mild lymphadenopathy in  the setting of what sounds to be a very mild viral illness could be causing some radicular discomfort or focal tenderness and lymph adenopathy in the right anterior neck.  She is otherwise well-appearing currently pain and symptom free her initial troponin is normal which is quite reassuring.  Fully awake and alert.  She has a history of following with cardiology, I discussed her current ECG with our cardiologist Dr. St. Bernard Sink.  He reviewed her prior notes and ECGs and advises no concerning changes noted, patient may reestablish care with [facility].  No clinical findings to suggest pneumothorax, dissection, carotid artery aneurysm, stroke, severe radicular pain etc. at this time.  Patient's presentation is most consistent with acute complicated illness / injury requiring diagnostic workup.  Patient agreeable to continue baby aspirin daily and follow-up with her primary doctor and cardiology. Return precautions and treatment recommendations and follow-up discussed with the patient who is agreeable with the plan.      FINAL CLINICAL IMPRESSION(S) / ED DIAGNOSES   Final diagnoses:  Radicular pain in right arm  Enlarged lymph node in neck     Rx / DC Orders   ED Discharge Orders          Ordered    Ambulatory referral to Cardiology       Comments: Er followup   12/31/23 1030    ondansetron (ZOFRAN-ODT) 4 MG disintegrating tablet  Every 6 hours PRN        Pending             Note:  This document was prepared using Dragon voice recognition software and may include unintentional dictation errors.   Sharyn Creamer, MD 12/31/23 [phone]

## 2023-12-31 NOTE — ED Notes (Signed)
 See triage notes. Patient c/o right arm pain, intermittent jaw pain and nausea since yesterday. Denies CP or injury to arm.

## 2023-12-31 NOTE — Discharge Instructions (Addendum)
 I recommend you follow-up with cardiology.  Please reestablish your care with [facility].   Return to the ER right away if you start developing chest pain difficulty breathing high fevers difficulty swallowing the pain becomes severe or persistent or other concerns arise.  I also suspect you have a slightly swollen lymph node causing some of your discomfort, possibly related to a mild viral illness but this should be followed up.  Please schedule follow-up with your doctor  so as well to make certain your lymph nodes are not enlarging when to follow-up on your pain and symptomatology.

## 2023-12-31 NOTE — ED Triage Notes (Signed)
 Patient to ED via POV for right arm pain. States intermittent jaw pain and nausea. Ongoing since yesterday. Denies CP. Denies injury to arm.

## 2024-01-02 ENCOUNTER — Ambulatory Visit: Attending: Cardiology | Admitting: Cardiology

## 2024-01-02 ENCOUNTER — Encounter: Payer: Self-pay | Admitting: Cardiology

## 2024-01-02 VITALS — BP 120/82 | HR 88 | Resp 16 | Ht 66.0 in | Wt 141.8 lb

## 2024-01-02 DIAGNOSIS — R072 Precordial pain: Secondary | ICD-10-CM | POA: Diagnosis not present

## 2024-01-02 MED ORDER — METOPROLOL TARTRATE 100 MG PO TABS: ORAL_TABLET | ORAL | 0 refills | Status: DC

## 2024-01-02 NOTE — Patient Instructions (Addendum)
 Testing/Procedures: Echo  Your physician has requested that you have an echocardiogram. Echocardiography is a painless test that uses sound waves to create images of your heart. It provides your doctor with information about the size and shape of your heart and how well your heart's chambers and valves are working. This procedure takes approximately one hour. There are no restrictions for this procedure. Please do NOT wear cologne, perfume, aftershave, or lotions (deodorant is allowed). Please arrive 15 minutes prior to your appointment time.  Please note: We ask at that you not bring children with you during ultrasound (echo/ vascular) testing. Due to room size and safety concerns, children are not allowed in the ultrasound rooms during exams. Our front office staff cannot provide observation of children in our lobby area while testing is being conducted. An adult accompanying a patient to their appointment will only be allowed in the ultrasound room at the discretion of the ultrasound technician under special circumstances. We apologize for any inconvenience.    Your cardiac CT will be scheduled at one of the below locations:   [facility] [address] [place], [place] [address] [phone]  When scheduled at [facility], please arrive at the [facility] and Children's Entrance (Entrance C2) of [facility] 30 minutes prior to test start time. You can use the FREE valet parking offered at entrance C (encouraged to control the heart rate for the test)  Proceed to the [facility] Radiology Department (first floor) to check-in and test prep.  All radiology patients and guests should use entrance C2 at [facility], accessed from [facility], even though the hospital's physical address listed is [address]    Please follow these instructions carefully (unless otherwise directed):  An IV will be required for this test and  Nitroglycerin will be given.   On the Night Before the Test: Be sure to Drink plenty of water. Do not consume any caffeinated/decaffeinated beverages or chocolate 12 hours prior to your test. Do not take any antihistamines 12 hours prior to your test.  On the Day of the Test: Drink plenty of water until 1 hour prior to the test. Do not eat any food 1 hour prior to test. You may take your regular medications prior to the test.  Take metoprolol (Lopressor) two hours prior to test. Do not take your normal dose that day.  FEMALES- please wear underwire-free bra if available, avoid dresses & tight clothing  After the Test: Drink plenty of water. After receiving IV contrast, you may experience a mild flushed feeling. This is normal. On occasion, you may experience a mild rash up to 24 hours after the test. This is not dangerous. If this occurs, you can take Benadryl 25 mg, Zyrtec, Claritin, or Allegra and increase your fluid intake. (Patients taking Tikosyn should avoid Benadryl, and may take Zyrtec, Claritin, or Allegra) If you experience trouble breathing, this can be serious. If it is severe call 911 IMMEDIATELY. If it is mild, please call our office.  We will call to schedule your test 2-4 weeks out understanding that some insurance companies will need an authorization prior to the service being performed.   For more information and frequently asked questions, please visit our website : [email or web address]  For non-scheduling related questions, please contact the cardiac imaging nurse navigator should you have any questions/concerns: Cardiac Imaging Nurse Navigators Direct Office Dial: [phone]   For scheduling needs, including cancellations and rescheduling, please call [place],  [phone].     Follow-Up: At [facility], you and your health needs are our priority.  As part of our continuing mission to provide you with exceptional heart care, we have  created designated Provider Care Teams.  These Care Teams include your primary Cardiologist (physician) and Advanced Practice Providers (APPs -  Physician Assistants and Nurse Practitioners) who all work together to provide you with the care you need, when you need it.  We recommend signing up for the patient portal called "MyChart".  Sign up information is provided on this After Visit Summary.  MyChart is used to connect with patients for Virtual Visits (Telemedicine).  Patients are able to view lab/test results, encounter notes, upcoming appointments, etc.  Non-urgent messages can be sent to your provider as well.   To learn more about what you can do with MyChart, go to [email or web address].    Your next appointment:   As needed   Provider:   Elder Negus, MD     Other Instructions   1st Floor: - Lobby - Registration  - Pharmacy  - Lab - Cafe  2nd Floor: - PV Lab - Diagnostic Testing (echo, CT, nuclear med)  3rd Floor: - Vacant  4th Floor: - TCTS (cardiothoracic surgery) - AFib Clinic - Structural Heart Clinic - Vascular Surgery  - Vascular Ultrasound  5th Floor: - HeartCare Cardiology (general and EP) - Clinical Pharmacy for coumadin, hypertension, lipid, weight-loss medications, and med management appointments    Valet parking services will be available as well.

## 2024-01-02 NOTE — Progress Notes (Signed)
  Cardiology Office Note:  .   Date:  01/02/2024  ID:  [name], DOB 23-Aug-1966, MRN [id number] PCP: [name], Mary Congress, DO  [name] Cardiologist:  Mary Mainland, MD PCP: [name], Mary Congress, DO  Chief Complaint  Patient presents with   Chest/neck pain     [name] is a 58 y.o. female with hyperlipidemia, former smoker, h/o breast cancer s/p b/l mastectomies, anxiety, chest/neck pain  Patient recently had an ER visit with neck/chest pain.  Patient denied having what she thought was stomach upset, concurrently also reported upper chest and right-sided neck pain.  ACS was excluded.  Patient tells me that she was told that she has "enlarged heart" while in the ER.  Patient mentions that he she is "a stressor", and has been concerned about heart attack ever since then.  Fortunately, she has quit smoking.   Vitals:   01/02/24 1537  BP: 120/82  Pulse: 88  Resp: 16  SpO2: 98%      Review of Systems  Cardiovascular:  Positive for chest pain. Negative for dyspnea on exertion, leg swelling, palpitations and syncope.        Studies Reviewed: Marland Kitchen        Independently interpreted 4-07/2023: Chol 115, TG 80, HDL 33, LDL 65 Hb 12.9 Cr 1.0 TSH 0.5    Physical Exam Vitals and nursing note reviewed.  Constitutional:      General: She is not in acute distress. Neck:     Vascular: No JVD.  Cardiovascular:     Rate and Rhythm: Normal rate and regular rhythm.     Heart sounds: Normal heart sounds. No murmur heard. Pulmonary:     Effort: Pulmonary effort is normal.     Breath sounds: Normal breath sounds. No wheezing or rales.      VISIT DIAGNOSES:   ICD-10-CM   1. Precordial pain  R07.2 ECHOCARDIOGRAM COMPLETE    CT CORONARY MORPH W/CTA COR W/SCORE W/CA W/CM &/OR WO/CM       [name] is a 58 y.o. female with hyperlipidemia, former smoker, h/o breast cancer s/p b/l mastectomies, anxiety, chest/neck pain  Assessment &  Plan  Precordial pain: Less likely be angina.  However, she has risk factors for CAD with smoking history.  I do not see any evidence of cardiomegaly on EKG, but patient is concerned.  Recommend coronary CT angiogram and echocardiogram.  Lipids well-controlled.  If both tests show no significant abnormalities, I will see her as needed.    Meds ordered this encounter  Medications   metoprolol tartrate (LOPRESSOR) 100 MG tablet    Sig: Take one tablet (100 mg) by mouth 2 hours prior to your cardiac CT.    Dispense:  1 tablet    Refill:  0      Signed, Mary Raimondo Emiliano Dyer, MD

## 2024-01-14 ENCOUNTER — Telehealth (HOSPITAL_COMMUNITY): Payer: Self-pay | Admitting: *Deleted

## 2024-01-14 NOTE — Telephone Encounter (Signed)
 Reaching out to patient to offer assistance regarding upcoming cardiac imaging study; pt verbalizes understanding of appt date/time, parking situation and where to check in, pre-test NPO status and medications ordered, and verified current allergies; name and call back number provided for further questions should they arise Johney Frame RN Navigator Cardiac Imaging [facility] [phone] office [phone] cell

## 2024-01-15 ENCOUNTER — Ambulatory Visit (HOSPITAL_COMMUNITY)
Admission: RE | Admit: 2024-01-15 | Discharge: 2024-01-15 | Disposition: A | Discharge status: Home / Self Care | Source: Ambulatory Visit | Attending: Cardiology | Admitting: Cardiology

## 2024-01-15 DIAGNOSIS — R072 Precordial pain: Secondary | ICD-10-CM | POA: Insufficient documentation

## 2024-01-15 MED ORDER — IOHEXOL 350 MG/ML SOLN
100.0000 mL | Freq: Once | INTRAVENOUS | Status: AC | PRN
  Administered 2024-01-15: 100 mL via INTRAVENOUS

## 2024-01-15 MED ORDER — METOPROLOL TARTRATE 5 MG/5ML IV SOLN: 10.0000 mg | Freq: Once | INTRAVENOUS | Status: DC | PRN

## 2024-01-15 MED ORDER — NITROGLYCERIN 0.4 MG SL SUBL
0.8000 mg | SUBLINGUAL_TABLET | Freq: Once | SUBLINGUAL | Status: AC
  Administered 2024-01-15: 0.8 mg via SUBLINGUAL

## 2024-01-15 MED ORDER — DILTIAZEM HCL 25 MG/5ML IV SOLN: 10.0000 mg | INTRAVENOUS | Status: DC | PRN

## 2024-01-15 MED ORDER — NITROGLYCERIN 0.4 MG SL SUBL
SUBLINGUAL_TABLET | SUBLINGUAL | Status: AC
  Filled 2024-01-15: qty 2

## 2024-01-18 ENCOUNTER — Encounter: Payer: Self-pay | Admitting: Cardiology

## 2024-01-19 NOTE — Progress Notes (Signed)
 Minimal nonobstructive plaque. Okay to discontinue Aspirin. Consider increasing Lipitor to 20 mg daily to further improve cholesterol to reduce future risk.  Thanks MJP

## 2024-01-25 ENCOUNTER — Other Ambulatory Visit: Payer: Self-pay | Admitting: Family Medicine

## 2024-01-25 DIAGNOSIS — E785 Hyperlipidemia, unspecified: Secondary | ICD-10-CM

## 2024-01-25 MED ORDER — ATORVASTATIN CALCIUM 20 MG PO TABS: 20.0000 mg | ORAL_TABLET | Freq: Every day | ORAL | 3 refills | Status: DC

## 2024-02-05 ENCOUNTER — Ambulatory Visit (INDEPENDENT_AMBULATORY_CARE_PROVIDER_SITE_OTHER): Payer: Commercial Managed Care - PPO | Admitting: Family Medicine

## 2024-02-05 ENCOUNTER — Encounter: Payer: Self-pay | Admitting: Family Medicine

## 2024-02-05 VITALS — BP 100/80 | HR 72 | Temp 98.0°F | Resp 18 | Ht 66.0 in | Wt 140.0 lb

## 2024-02-05 DIAGNOSIS — E785 Hyperlipidemia, unspecified: Secondary | ICD-10-CM

## 2024-02-05 DIAGNOSIS — Z1211 Encounter for screening for malignant neoplasm of colon: Secondary | ICD-10-CM

## 2024-02-05 DIAGNOSIS — Z0000 Encounter for general adult medical examination without abnormal findings: Secondary | ICD-10-CM | POA: Diagnosis not present

## 2024-02-05 DIAGNOSIS — Z23 Encounter for immunization: Secondary | ICD-10-CM | POA: Diagnosis not present

## 2024-02-05 DIAGNOSIS — I10 Essential (primary) hypertension: Secondary | ICD-10-CM | POA: Diagnosis not present

## 2024-02-05 LAB — COMPREHENSIVE METABOLIC PANEL WITH GFR
ALT: 15 U/L (ref 0–35)
AST: 18 U/L (ref 0–37)
Albumin: 4.2 g/dL (ref 3.5–5.2)
Alkaline Phosphatase: 47 U/L (ref 39–117)
BUN: 22 mg/dL (ref 6–23)
CO2: 29 meq/L (ref 19–32)
Calcium: 9.6 mg/dL (ref 8.4–10.5)
Chloride: 105 meq/L (ref 96–112)
Creatinine, Ser: 1.05 mg/dL (ref 0.40–1.20)
GFR: 58.94 mL/min — ABNORMAL LOW
Glucose, Bld: 79 mg/dL (ref 70–99)
Potassium: 5 meq/L (ref 3.5–5.1)
Sodium: 138 meq/L (ref 135–145)
Total Bilirubin: 0.4 mg/dL (ref 0.2–1.2)
Total Protein: 7.2 g/dL (ref 6.0–8.3)

## 2024-02-05 LAB — CBC WITH DIFFERENTIAL/PLATELET
Basophils Absolute: 0 10*3/uL (ref 0.0–0.1)
Basophils Relative: 0.7 % (ref 0.0–3.0)
Eosinophils Absolute: 0.2 10*3/uL (ref 0.0–0.7)
Eosinophils Relative: 5.3 % — ABNORMAL HIGH (ref 0.0–5.0)
HCT: 38.2 % (ref 36.0–46.0)
Hemoglobin: 12.7 g/dL (ref 12.0–15.0)
Lymphocytes Relative: 35.3 % (ref 12.0–46.0)
Lymphs Abs: 1.5 10*3/uL (ref 0.7–4.0)
MCHC: 33.3 g/dL (ref 30.0–36.0)
MCV: 90.5 fl (ref 78.0–100.0)
Monocytes Absolute: 0.5 10*3/uL (ref 0.1–1.0)
Monocytes Relative: 11.4 % (ref 3.0–12.0)
Neutro Abs: 2 10*3/uL (ref 1.4–7.7)
Neutrophils Relative %: 47.3 % (ref 43.0–77.0)
Platelets: 272 10*3/uL (ref 150.0–400.0)
RBC: 4.22 Mil/uL (ref 3.87–5.11)
RDW: 13.7 % (ref 11.5–15.5)
WBC: 4.2 10*3/uL (ref 4.0–10.5)

## 2024-02-05 LAB — LIPID PANEL
Cholesterol: 117 mg/dL (ref 0–200)
HDL: 32.7 mg/dL — ABNORMAL LOW
LDL Cholesterol: 70 mg/dL (ref 0–99)
NonHDL: 84.7
Total CHOL/HDL Ratio: 4
Triglycerides: 72 mg/dL (ref 0.0–149.0)
VLDL: 14.4 mg/dL (ref 0.0–40.0)

## 2024-02-05 LAB — TSH: TSH: 0.49 u[IU]/mL (ref 0.35–5.50)

## 2024-02-05 MED ORDER — ATORVASTATIN CALCIUM 20 MG PO TABS: 20.0000 mg | ORAL_TABLET | Freq: Every day | ORAL | Status: AC

## 2024-02-05 NOTE — Progress Notes (Signed)
 Established Patient Office Visit  Subjective   Patient ID: [name], female    DOB: 1965/12/31  Age: 58 y.o. MRN: [id number]  Chief Complaint  Patient presents with   Annual Exam    Pt states having a soda this morning.     HPI Discussed the use of AI scribe software for clinical note transcription with the patient, who gave verbal consent to proceed.  History of Present Illness [name] is a 58 year old female who presents for an annual physical exam.  She feels fine with no current pain, stress, or anxiety. She recently visited her cardiologist due to a pain that was deemed non-significant. Her cardiologist advised discontinuing aspirin  and increased her atorvastatin  dosage to 20 mg daily. She is currently taking atorvastatin , fenofibrate , and metoprolol  daily at 6 PM.  She has a history of breast cancer with two recurrences and expresses concern about her health, feeling like she has always been the 'sick one' in her family. She is hesitant about undergoing a colonoscopy and prefers not to know about potential health issues.  She is scheduled for an echocardiogram on May 2nd and recently had a CT scan, which showed a calcium  score of zero. She has had cornea transplants and uses scleral lenses, which she finds challenging to manage.  She smokes but has significantly reduced her consumption, planning to quit by the summer. She has stopped smoking in her car and house and is down to a pack lasting almost a week.  Her family history includes a nephew diagnosed with multiple sclerosis at age 28, who is in denial about his condition. He is receiving treatment with infusions but shows significant symptoms like a wide gait and brain fog. She is concerned about his mental health and the lack of support groups for his age.  No issues with her stomach or joints, except for an occasionally problematic ankle. She has a longstanding mole that has not changed. She is not currently taking  Lexapro  for anxiety or depression, although she has a bottle at home.   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/10/2018   Chronic pain of right ankle 07/26/2018   Osteochondral defect of talus 06/11/2018   Lightheadedness 08/14/2016   Enlarged pituitary gland (HCC) 08/08/2016   Generalized anxiety disorder 11/02/2015   Facial droop 12/01/2013   Sensory disturbance 12/01/2013   Trichomonas contact, treated 08/05/2013   FLANK PAIN, LEFT 11/22/2010   TOBACCO USE 10/11/2010   BREAST CANCER, HX OF 10/11/2010   Past Medical History:  Diagnosis Date   Anxiety    Breast cancer (HCC) 1999   2004   Fatigue    Headache    History of exercise stress test    GXT 9/19:  Exercised 5'48"; METs: 7; Normal BP response; No ischemic EKG changes   Hyperlipidemia    Lightheadedness    Numbness    Tobacco abuse    Past Surgical History:  Procedure Laterality Date   ABDOMINAL HYSTERECTOMY  2006   TAH,BSO secondary bleeding and breast cancer--   BREAST SURGERY     CORNEAL TRANSPLANT Bilateral    6x, started in 1999, last was 2006.   EYE SURGERY  MASTECTOMY     bilateral---1999/2004   Social History   Tobacco Use   Smoking status: Some Days    Current packs/day: 0.25    Average packs/day: 0.3 packs/day for 27.0 years (6.8 ttl pk-yrs)    Types: Cigarettes   Smokeless tobacco: Never   Tobacco comments:    Trying to quit  Vaping Use   Vaping status: Never Used  Substance Use Topics   Alcohol use: Yes    Alcohol/week: 0.0 standard drinks of alcohol    Comment: Rare   Drug use: No   Social History   Socioeconomic History   Marital status: Single    Spouse name: Not on file    Number of children: 1   Years of education: 16   Highest education level: Not on file  Occupational History   Occupation: works for [employer]; office specialist  Tobacco Use   Smoking status: Some Days    Current packs/day: 0.25    Average packs/day: 0.3 packs/day for 27.0 years (6.8 ttl pk-yrs)    Types: Cigarettes   Smokeless tobacco: Never   Tobacco comments:    Trying to quit  Vaping Use   Vaping status: Never Used  Substance and Sexual Activity   Alcohol use: Yes    Alcohol/week: 0.0 standard drinks of alcohol    Comment: Rare   Drug use: No   Sexual activity: Not Currently  Other Topics Concern   Not on file  Social History Narrative   Marital status: single      Children: one daughter      Lives: with sister      Employment: works for [employer]; office specialist      Education: Pharmacologist      Tobacco: trying to quit      Alcohol: none; rare      Drugs: none      Exercise: walking daily 2 miles      Caffeine use: Tea/pepsi- 1 pepsi/morning      Tea- 3 cups per day   Right-handed   Social Drivers of Corporate investment banker Strain: Not on file  Food Insecurity: Not on file  Transportation Needs: Not on file  Physical Activity: Not on file  Stress: Not on file  Social Connections: Not on file  Intimate Partner Violence: Not on file   Family Status  Relation Name Status   Mother  Deceased at age 68       brain aneurysm   Father  Deceased at age 57       homicide   Sister  Alive   Sister  Alive   Sister  Alive       73   MGM  Deceased   MGF  Deceased   PGM  Deceased   PGF  Deceased   Education administrator   Other  (Not Specified)   Neg Hx  (Not Specified)  No partnership data on file   Family History  Problem Relation Age of Onset   Aortic aneurysm Mother    Stroke Sister 18       after starting OCP   Breast cancer Sister    Diabetes Sister    Hyperlipidemia Sister    Diabetes Mellitus I Sister     Multiple sclerosis Nephew    Aortic aneurysm Other    Hypertension Other    Seizures Neg Hx    Migraines Neg Hx    Allergies  Allergen  Reactions   Penicillins Other (See Comments)    Childhood reaction      Review of Systems  Constitutional:  Negative for chills, fever and malaise/fatigue.  HENT:  Negative for congestion and hearing loss.   Eyes:  Negative for blurred vision and discharge.  Respiratory:  Negative for cough, sputum production and shortness of breath.   Cardiovascular:  Negative for chest pain, palpitations and leg swelling.  Gastrointestinal:  Negative for abdominal pain, blood in stool, constipation, diarrhea, heartburn, nausea and vomiting.  Genitourinary:  Negative for dysuria, frequency, hematuria and urgency.  Musculoskeletal:  Negative for back pain, falls and myalgias.  Skin:  Negative for rash.  Neurological:  Negative for dizziness, sensory change, loss of consciousness, weakness and headaches.  Endo/Heme/Allergies:  Negative for environmental allergies. Does not bruise/bleed easily.  Psychiatric/Behavioral:  Negative for depression and suicidal ideas. The patient is not nervous/anxious and does not have insomnia.       Objective:     BP 100/80 (BP Location: Right Arm, Patient Position: Sitting, Cuff Size: Normal)   Pulse 72   Temp 98 F (36.7 C) (Oral)   Resp 18   Ht 5\' 6"  (1.676 m)   Wt 140 lb (63.5 kg)   BMI 22.60 kg/m  BP Readings from Last 3 Encounters:  02/05/24 100/80  01/15/24 99/72  01/02/24 120/82   Wt Readings from Last 3 Encounters:  02/05/24 140 lb (63.5 kg)  01/02/24 141 lb 12.8 oz (64.3 kg)  12/31/23 120 lb (54.4 kg)   SpO2 Readings from Last 3 Encounters:  01/02/24 98%  12/31/23 96%  07/11/23 98%      Physical Exam Vitals and nursing note reviewed.  Constitutional:      General: She is not in acute distress.    Appearance: Normal appearance. She is well-developed.  HENT:     Head: Normocephalic and atraumatic.      Right Ear: Tympanic membrane, ear canal and external ear normal. There is no impacted cerumen.     Left Ear: Tympanic membrane, ear canal and external ear normal. There is no impacted cerumen.     Nose: Nose normal.     Mouth/Throat:     Mouth: Mucous membranes are moist.     Pharynx: Oropharynx is clear. No oropharyngeal exudate or posterior oropharyngeal erythema.  Eyes:     General: No scleral icterus.       Right eye: No discharge.        Left eye: No discharge.     Conjunctiva/sclera: Conjunctivae normal.     Pupils: Pupils are equal, round, and reactive to light.  Neck:     Thyroid : No thyromegaly or thyroid  tenderness.     Vascular: No JVD.  Cardiovascular:     Rate and Rhythm: Normal rate and regular rhythm.     Heart sounds: Normal heart sounds. No murmur heard. Pulmonary:     Effort: Pulmonary effort is normal. No respiratory distress.     Breath sounds: Normal breath sounds.  Abdominal:     General: Bowel sounds are normal. There is no distension.     Palpations: Abdomen is soft. There is no mass.     Tenderness: There is no abdominal tenderness. There is no guarding or rebound.  Musculoskeletal:        General: Normal range of motion.     Cervical back: Normal range of motion and neck supple.     Right lower leg: No edema.     Left lower  leg: No edema.  Lymphadenopathy:     Cervical: No cervical adenopathy.  Skin:    General: Skin is warm and dry.     Findings: No erythema or rash.  Neurological:     Mental Status: She is alert and oriented to person, place, and time.     Cranial Nerves: No cranial nerve deficit.     Deep Tendon Reflexes: Reflexes are normal and symmetric.  Psychiatric:        Mood and Affect: Mood normal.        Behavior: Behavior normal.        Thought Content: Thought content normal.        Judgment: Judgment normal.      No results found for any visits on 02/05/24.  Last CBC Lab Results  Component Value Date   WBC 7.0 12/31/2023    HGB 13.4 12/31/2023   HCT 41.3 12/31/2023   MCV 92.8 12/31/2023   MCH 30.1 12/31/2023   RDW 13.2 12/31/2023   PLT 309 12/31/2023   Last metabolic panel Lab Results  Component Value Date   GLUCOSE 108 (H) 12/31/2023   NA 139 12/31/2023   K 3.9 12/31/2023   CL 108 12/31/2023   CO2 21 (L) 12/31/2023   BUN 16 12/31/2023   CREATININE 1.15 (H) 12/31/2023   GFRNONAA 56 (L) 12/31/2023   CALCIUM  9.7 12/31/2023   PROT 7.5 07/11/2023   ALBUMIN 4.2 07/11/2023   BILITOT 0.4 07/11/2023   ALKPHOS 50 07/11/2023   AST 21 07/11/2023   ALT 17 07/11/2023   ANIONGAP 10 12/31/2023   Last lipids Lab Results  Component Value Date   CHOL 115 02/01/2023   HDL 33.80 (L) 02/01/2023   LDLCALC 65 02/01/2023   LDLDIRECT 90.0 03/17/2020   TRIG 80.0 02/01/2023   CHOLHDL 3 02/01/2023   Last hemoglobin A1c Lab Results  Component Value Date   HGBA1C 5.6 12/02/2013   Last thyroid  functions Lab Results  Component Value Date   TSH 0.56 07/11/2023   Last vitamin D  Lab Results  Component Value Date   VD25OH 10.51 (L) 02/05/2023   Last vitamin B12 and Folate Lab Results  Component Value Date   VITAMINB12 320 02/05/2023      The ASCVD Risk score (Arnett DK, et al., 2019) failed to calculate for the following reasons:   The valid total cholesterol range is 130 to 320 mg/dL    Assessment & Plan:   Problem List Items Addressed This Visit       Unprioritized   Preventative health care - Primary   Ghm utd Check labs  See AVS Health Maintenance  Topic Date Due   Colonoscopy  Never done   COVID-19 Vaccine (3 - Pfizer risk series) 02/13/2020   INFLUENZA VACCINE  05/09/2024   DTaP/Tdap/Td (3 - Td or Tdap) 01/31/2033   Hepatitis C Screening  Completed   HIV Screening  Completed   Zoster Vaccines- Shingrix  Completed   HPV VACCINES  Aged Out   Meningococcal B Vaccine  Aged Out   Pneumococcal Vaccine 38-56 Years old  Discontinued         Relevant Orders   CBC with  Differential/Platelet   Comprehensive metabolic panel with GFR   Lipid panel   TSH   Hyperlipidemia   Tolerating statin, encouraged heart healthy diet, avoid trans fats, minimize simple carbs and saturated fats. Increase exercise as tolerated       Relevant Medications   atorvastatin  (LIPITOR) 20 MG tablet  Other Relevant Orders   Comprehensive metabolic panel with GFR   Lipid panel   HTN (hypertension)   Well controlled, no changes to meds. Encouraged heart healthy diet such as the DASH diet and exercise as tolerated.        Relevant Medications   atorvastatin  (LIPITOR) 20 MG tablet   Other Relevant Orders   CBC with Differential/Platelet   Comprehensive metabolic panel with GFR   Other Visit Diagnoses       Colon cancer screening       Relevant Orders   Cologuard     Need for pneumococcal 20-valent conjugate vaccination       Relevant Orders   Pneumococcal conjugate vaccine 20-valent (Prevnar 20) (Completed)     Assessment and Plan Assessment & Plan Wellness Visit   During her routine wellness visit, she reported no current pain, stress, or anxiety. Upcoming travel for work and family matters was discussed. Her dietary habits show a preference for potatoes and bread; moderation and healthier alternatives were encouraged, along with increased fruit and vegetable intake. Blood work was ordered.  Hypertension   Her hypertension is managed with metoprolol .  Hyperlipidemia   Hyperlipidemia is managed with atorvastatin , recently increased to 20 mg by her cardiologist, and fenofibrate . Dietary modifications to manage cholesterol levels were discussed. She should continue atorvastatin  20 mg and fenofibrate  as prescribed.  Tobacco use   She is currently smoking less and has set a date to quit smoking. Progress in reducing smoking habits and plans to quit completely were discussed. Smoking cessation and her plan to quit are encouraged.  Breast cancer   She has a history of  breast cancer with no current issues reported. Although she expressed concerns about potential health issues, she is currently asymptomatic.  Corneal transplant status   She has corneal transplants and uses scleral lenses. No current issues are reported, but she has an upcoming appointment with a specialist. Follow-up with her ophthalmologist for corneal transplant status should continue.  General Health Maintenance   Colon cancer screening and the option of using Cologuard were discussed. Despite her reluctance, the benefits of early detection were explained. Pneumonia vaccination, recommended for those 50 and over, was discussed and will be administered.    Return in about 6 months (around 08/06/2024), or if symptoms worsen or fail to improve, for hypertension, hyperlipidemia.    Alejos Reinhardt R Lowne Chase, DO

## 2024-02-05 NOTE — Assessment & Plan Note (Signed)
 Tolerating statin, encouraged heart healthy diet, avoid trans fats, minimize simple carbs and saturated fats. Increase exercise as tolerated

## 2024-02-05 NOTE — Assessment & Plan Note (Signed)
 Ghm utd Check labs  See AVS Health Maintenance  Topic Date Due   Colonoscopy  Never done   COVID-19 Vaccine (3 - Pfizer risk series) 02/13/2020   INFLUENZA VACCINE  05/09/2024   DTaP/Tdap/Td (3 - Td or Tdap) 01/31/2033   Hepatitis C Screening  Completed   HIV Screening  Completed   Zoster Vaccines- Shingrix  Completed   HPV VACCINES  Aged Out   Meningococcal B Vaccine  Aged Out   Pneumococcal Vaccine 88-58 Years old  Discontinued

## 2024-02-05 NOTE — Patient Instructions (Signed)
 Preventive Care 58-58 Years Old, Female  Preventive care refers to lifestyle choices and visits with your health care provider that can promote health and wellness. Preventive care visits are also called wellness exams.  What can I expect for my preventive care visit?  Counseling  Your health care provider may ask you questions about your:  Medical history, including:  Past medical problems.  Family medical history.  Pregnancy history.  Current health, including:  Menstrual cycle.  Method of birth control.  Emotional well-being.  Home life and relationship well-being.  Sexual activity and sexual health.  Lifestyle, including:  Alcohol, nicotine or tobacco, and drug use.  Access to firearms.  Diet, exercise, and sleep habits.  Work and work Astronomer.  Sunscreen use.  Safety issues such as seatbelt and bike helmet use.  Physical exam  Your health care provider will check your:  Height and weight. These may be used to calculate your BMI (body mass index). BMI is a measurement that tells if you are at a healthy weight.  Waist circumference. This measures the distance around your waistline. This measurement also tells if you are at a healthy weight and may help predict your risk of certain diseases, such as type 2 diabetes and high blood pressure.  Heart rate and blood pressure.  Body temperature.  Skin for abnormal spots.  What immunizations do I need?    Vaccines are usually given at various ages, according to a schedule. Your health care provider will recommend vaccines for you based on your age, medical history, and lifestyle or other factors, such as travel or where you work.  What tests do I need?  Screening  Your health care provider may recommend screening tests for certain conditions. This may include:  Lipid and cholesterol levels.  Diabetes screening. This is done by checking your blood sugar (glucose) after you have not eaten for a while (fasting).  Pelvic exam and Pap test.  Hepatitis B test.  Hepatitis C  test.  HIV (human immunodeficiency virus) test.  STI (sexually transmitted infection) testing, if you are at risk.  Lung cancer screening.  Colorectal cancer screening.  Mammogram. Talk with your health care provider about when you should start having regular mammograms. This may depend on whether you have a family history of breast cancer.  BRCA-related cancer screening. This may be done if you have a family history of breast, ovarian, tubal, or peritoneal cancers.  Bone density scan. This is done to screen for osteoporosis.  Talk with your health care provider about your test results, treatment options, and if necessary, the need for more tests.  Follow these instructions at home:  Eating and drinking    Eat a diet that includes fresh fruits and vegetables, whole grains, lean protein, and low-fat dairy products.  Take vitamin and mineral supplements as recommended by your health care provider.  Do not drink alcohol if:  Your health care provider tells you not to drink.  You are pregnant, may be pregnant, or are planning to become pregnant.  If you drink alcohol:  Limit how much you have to 0-1 drink a day.  Know how much alcohol is in your drink. In the [place] one drink equals one 12 oz bottle of beer (355 mL), one 5 oz glass of wine (148 mL), or one 1 oz glass of hard liquor (44 mL).  Lifestyle  Brush your teeth every morning and night with fluoride toothpaste. Floss one time each day.  Exercise for at least  30 minutes 5 or more days each week.  Do not use any products that contain nicotine or tobacco. These products include cigarettes, chewing tobacco, and vaping devices, such as e-cigarettes. If you need help quitting, ask your health care provider.  Do not use drugs.  If you are sexually active, practice safe sex. Use a condom or other form of protection to prevent STIs.  If you do not wish to become pregnant, use a form of birth control. If you plan to become pregnant, see your health care provider for a  prepregnancy visit.  Take aspirin only as told by your health care provider. Make sure that you understand how much to take and what form to take. Work with your health care provider to find out whether it is safe and beneficial for you to take aspirin daily.  Find healthy ways to manage stress, such as:  Meditation, yoga, or listening to music.  Journaling.  Talking to a trusted person.  Spending time with friends and family.  Minimize exposure to UV radiation to reduce your risk of skin cancer.  Safety  Always wear your seat belt while driving or riding in a vehicle.  Do not drive:  If you have been drinking alcohol. Do not ride with someone who has been drinking.  When you are tired or distracted.  While texting.  If you have been using any mind-altering substances or drugs.  Wear a helmet and other protective equipment during sports activities.  If you have firearms in your house, make sure you follow all gun safety procedures.  Seek help if you have been physically or sexually abused.  What's next?  Visit your health care provider once a year for an annual wellness visit.  Ask your health care provider how often you should have your eyes and teeth checked.  Stay up to date on all vaccines.  This information is not intended to replace advice given to you by your health care provider. Make sure you discuss any questions you have with your health care provider.  Document Revised: 03/23/2021 Document Reviewed: 03/23/2021  Elsevier Patient Education  2024 [employer].

## 2024-02-05 NOTE — Assessment & Plan Note (Signed)
 Well controlled, no changes to meds. Encouraged heart healthy diet such as the DASH diet and exercise as tolerated.

## 2024-02-08 ENCOUNTER — Ambulatory Visit (HOSPITAL_COMMUNITY): Attending: Cardiology

## 2024-02-08 DIAGNOSIS — I361 Nonrheumatic tricuspid (valve) insufficiency: Secondary | ICD-10-CM | POA: Diagnosis not present

## 2024-02-08 DIAGNOSIS — R072 Precordial pain: Secondary | ICD-10-CM | POA: Diagnosis present

## 2024-02-08 LAB — ECHOCARDIOGRAM COMPLETE
Area-P 1/2: 4.26 cm2
S' Lateral: 3.2 cm

## 2024-02-09 ENCOUNTER — Encounter: Payer: Self-pay | Admitting: Family Medicine

## 2024-02-11 ENCOUNTER — Encounter: Payer: Self-pay | Admitting: Cardiology

## 2024-02-21 LAB — COLOGUARD: COLOGUARD: NEGATIVE

## 2024-04-09 ENCOUNTER — Telehealth: Payer: Self-pay

## 2024-04-09 MED ORDER — METOPROLOL SUCCINATE ER 50 MG PO TB24
50.0000 mg | ORAL_TABLET | Freq: Every day | ORAL | 0 refills | Status: DC
Start: 2024-04-09 — End: 2024-04-30

## 2024-04-09 NOTE — Telephone Encounter (Signed)
 Chart reviewed, refill not received. Rx sent

## 2024-04-09 NOTE — Telephone Encounter (Signed)
 Copied from CRM [id number]. Topic: Clinical - Prescription Issue >> Apr 09, 2024 12:04 PM Chasity T wrote: Reason for CRM: Patient states that [facility] on file sent a request for refill on  metoprolol  succinate (TOPROL -XL) 50 MG 24 hr tablet. Waiting on an approval to be sent back.

## 2024-04-10 ENCOUNTER — Encounter: Payer: Self-pay | Admitting: Family Medicine

## 2024-04-10 ENCOUNTER — Ambulatory Visit: Admitting: Family Medicine

## 2024-04-10 VITALS — BP 108/80 | HR 84 | Temp 98.3°F | Resp 16 | Ht 66.0 in | Wt 139.6 lb

## 2024-04-10 DIAGNOSIS — R1013 Epigastric pain: Secondary | ICD-10-CM | POA: Diagnosis not present

## 2024-04-10 DIAGNOSIS — R110 Nausea: Secondary | ICD-10-CM

## 2024-04-10 LAB — CBC WITH DIFFERENTIAL/PLATELET
Basophils Absolute: 0 10*3/uL (ref 0.0–0.1)
Basophils Relative: 0.6 % (ref 0.0–3.0)
Eosinophils Absolute: 0.1 10*3/uL (ref 0.0–0.7)
Eosinophils Relative: 1.4 % (ref 0.0–5.0)
HCT: 40 % (ref 36.0–46.0)
Hemoglobin: 13.3 g/dL (ref 12.0–15.0)
Lymphocytes Relative: 35.1 % (ref 12.0–46.0)
Lymphs Abs: 1.9 10*3/uL (ref 0.7–4.0)
MCHC: 33.1 g/dL (ref 30.0–36.0)
MCV: 88.9 fl (ref 78.0–100.0)
Monocytes Absolute: 0.5 10*3/uL (ref 0.1–1.0)
Monocytes Relative: 8.9 % (ref 3.0–12.0)
Neutro Abs: 2.9 10*3/uL (ref 1.4–7.7)
Neutrophils Relative %: 54 % (ref 43.0–77.0)
Platelets: 292 10*3/uL (ref 150.0–400.0)
RBC: 4.5 Mil/uL (ref 3.87–5.11)
RDW: 13.7 % (ref 11.5–15.5)
WBC: 5.3 10*3/uL (ref 4.0–10.5)

## 2024-04-10 LAB — COMPREHENSIVE METABOLIC PANEL WITH GFR
ALT: 17 U/L (ref 0–35)
AST: 22 U/L (ref 0–37)
Albumin: 4.7 g/dL (ref 3.5–5.2)
Alkaline Phosphatase: 48 U/L (ref 39–117)
BUN: 24 mg/dL — ABNORMAL HIGH (ref 6–23)
CO2: 30 meq/L (ref 19–32)
Calcium: 10.9 mg/dL — ABNORMAL HIGH (ref 8.4–10.5)
Chloride: 102 meq/L (ref 96–112)
Creatinine, Ser: 1.18 mg/dL (ref 0.40–1.20)
GFR: 51.17 mL/min — ABNORMAL LOW (ref >60.00)
Glucose, Bld: 84 mg/dL (ref 70–99)
Potassium: 5.1 meq/L (ref 3.5–5.1)
Sodium: 139 meq/L (ref 135–145)
Total Bilirubin: 0.5 mg/dL (ref 0.2–1.2)
Total Protein: 8.1 g/dL (ref 6.0–8.3)

## 2024-04-10 MED ORDER — PANTOPRAZOLE SODIUM 20 MG PO TBEC: 20.0000 mg | DELAYED_RELEASE_TABLET | Freq: Every day | ORAL | 6 refills | Status: AC

## 2024-04-10 MED ORDER — ONDANSETRON 4 MG PO TBDP: 4.0000 mg | ORAL_TABLET | Freq: Three times a day (TID) | ORAL | 0 refills | Status: AC | PRN

## 2024-04-10 NOTE — Progress Notes (Signed)
 Established Patient Office Visit  Subjective   Patient ID: [name], female    DOB: [birth year]  Age: 58 y.o. MRN: [id number]  Chief Complaint  Patient presents with   Heartburn    Pt states having heart burn nausea, and headache. No vomiting. Pt reports reflux. Pt states using Tum and sxs went away    HPI Discussed the use of AI scribe software for clinical note transcription with the patient, who gave verbal consent to proceed.  History of Present Illness [name] is a 58 year old female who presents with persistent nausea and recent onset heartburn.  She has been experiencing persistent nausea since Monday, which is not accompanied by vomiting but causes gagging. The nausea is present upon waking and persists throughout the day. She is able to eat but lacks appetite and has to force herself to eat. She drank an electrolyte solution and took medication for a headache on Monday, which initially seemed to help, but the nausea has persisted. She is not currently taking any specific medication for her nausea but recalls previously using sublingual tablets for nausea, which she no longer has.  On Tuesday night, she consumed hot honey mustard chicken wings, which she has eaten before without issue. However, on Wednesday, she experienced severe heartburn for the first time, which lasted about an hour and was relieved by Tums. She describes the heartburn as a burning sensation in her chest, without radiation to her back or jaw. No associated chest pain, arm pain, or shortness of breath. She has a history of consuming spicy foods without previous episodes of heartburn.  She mentions a recent stressor involving a family member's health, which she wonders might be contributing to her symptoms. No recent changes in her physical activity, noting that her job requires her to walk approximately three miles, and she has not experienced shortness of breath during these activities. She recalls  having an EKG and a CT scan of her heart in the past, with a calcium  score of zero.   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/10/2018   Chronic pain of right ankle 07/26/2018   Osteochondral defect of talus 06/11/2018   Lightheadedness 08/14/2016   Enlarged pituitary gland (HCC) 08/08/2016   Generalized anxiety disorder 11/02/2015   Facial droop 12/01/2013   Sensory disturbance 12/01/2013   Trichomonas contact, treated 08/05/2013   FLANK PAIN, LEFT 11/22/2010   TOBACCO USE 10/11/2010   BREAST CANCER, HX OF 10/11/2010   Past Medical History:  Diagnosis Date   Anxiety    Breast cancer (HCC) 1999   2004   Fatigue    Headache    History of exercise stress test    GXT 9/19:  Exercised 5'48; METs: 7; Normal BP response; No ischemic EKG changes   Hyperlipidemia    Lightheadedness    Numbness    Tobacco abuse    Past Surgical History:  Procedure Laterality Date   ABDOMINAL HYSTERECTOMY  2006   TAH,BSO secondary bleeding and breast cancer--   BREAST SURGERY     CORNEAL TRANSPLANT Bilateral    6x, started in 1999, last was 2006.   EYE SURGERY     MASTECTOMY  bilateral---1999/2004   Social History   Tobacco Use   Smoking status: Some Days    Current packs/day: 0.25    Average packs/day: 0.3 packs/day for 27.0 years (6.8 ttl pk-yrs)    Types: Cigarettes   Smokeless tobacco: Never   Tobacco comments:    Trying to quit  Vaping Use   Vaping status: Never Used  Substance Use Topics   Alcohol use: Yes    Alcohol/week: 0.0 standard drinks of alcohol    Comment: Rare   Drug use: No   Social History   Socioeconomic History   Marital status: Single    Spouse  name: Not on file   Number of children: 1   Years of education: 16   Highest education level: Not on file  Occupational History   Occupation: works for [employer]; office specialist  Tobacco Use   Smoking status: Some Days    Current packs/day: 0.25    Average packs/day: 0.3 packs/day for 27.0 years (6.8 ttl pk-yrs)    Types: Cigarettes   Smokeless tobacco: Never   Tobacco comments:    Trying to quit  Vaping Use   Vaping status: Never Used  Substance and Sexual Activity   Alcohol use: Yes    Alcohol/week: 0.0 standard drinks of alcohol    Comment: Rare   Drug use: No   Sexual activity: Not Currently  Other Topics Concern   Not on file  Social History Narrative   Marital status: single      Children: one daughter      Lives: with sister      Employment: works for [employer]; office specialist      Education: Pharmacologist      Tobacco: trying to quit      Alcohol: none; rare      Drugs: none      Exercise: walking daily 2 miles      Caffeine use: Tea/pepsi- 1 pepsi/morning      Tea- 3 cups per day   Right-handed   Social Drivers of Corporate investment banker Strain: Not on file  Food Insecurity: Not on file  Transportation Needs: Not on file  Physical Activity: Not on file  Stress: Not on file  Social Connections: Not on file  Intimate Partner Violence: Not on file   Family Status  Relation Name Status   Mother  Deceased at age 44       brain aneurysm   Father  Deceased at age 9       homicide   Sister  Alive   Sister  Alive   Sister  Alive       43   MGM  Deceased   MGF  Deceased   PGM  Deceased   PGF  Deceased   Education administrator   Other  (Not Specified)   Neg Hx  (Not Specified)  No partnership data on file   Family History  Problem Relation Age of Onset   Aortic aneurysm Mother    Stroke Sister 36       after starting OCP   Breast cancer Sister    Diabetes Sister    Hyperlipidemia Sister    Diabetes  Mellitus I Sister    Multiple sclerosis Nephew    Aortic aneurysm Other    Hypertension Other    Seizures Neg Hx    Migraines Neg Hx    Allergies  Allergen Reactions   Penicillins Other (  See Comments)    Childhood reaction      Review of Systems  Constitutional:  Negative for fever and malaise/fatigue.  HENT:  Negative for congestion.   Eyes:  Negative for blurred vision.  Respiratory:  Negative for cough and shortness of breath.   Cardiovascular:  Negative for chest pain, palpitations and leg swelling.  Gastrointestinal:  Positive for heartburn and nausea. Negative for abdominal pain, blood in stool, constipation, diarrhea, melena and vomiting.  Musculoskeletal:  Negative for back pain.  Skin:  Negative for rash.  Neurological:  Negative for loss of consciousness and headaches.      Objective:     BP 108/80 (BP Location: Right Arm, Patient Position: Sitting, Cuff Size: Normal)   Pulse 84   Temp 98.3 F (36.8 C) (Oral)   Resp 16   Ht 5' 6 (1.676 m)   Wt 139 lb 9.6 oz (63.3 kg)   SpO2 98%   BMI 22.53 kg/m  BP Readings from Last 3 Encounters:  04/10/24 108/80  02/05/24 100/80  01/15/24 99/72   Wt Readings from Last 3 Encounters:  04/10/24 139 lb 9.6 oz (63.3 kg)  02/05/24 140 lb (63.5 kg)  01/02/24 141 lb 12.8 oz (64.3 kg)   SpO2 Readings from Last 3 Encounters:  04/10/24 98%  01/02/24 98%  12/31/23 96%      Physical Exam Vitals and nursing note reviewed.  Constitutional:      General: She is not in acute distress.    Appearance: Normal appearance. She is well-developed.  HENT:     Head: Normocephalic and atraumatic.  Eyes:     General: No scleral icterus.       Right eye: No discharge.        Left eye: No discharge.  Cardiovascular:     Rate and Rhythm: Normal rate and regular rhythm.     Heart sounds: No murmur heard. Pulmonary:     Effort: Pulmonary effort is normal. No respiratory distress.     Breath sounds: Normal breath sounds.   Abdominal:     General: Abdomen is flat. Bowel sounds are normal. There is no distension.     Palpations: Abdomen is soft. There is no mass.     Tenderness: There is no abdominal tenderness. There is no right CVA tenderness, left CVA tenderness, guarding or rebound.     Hernia: No hernia is present.  Musculoskeletal:        General: Normal range of motion.     Cervical back: Normal range of motion and neck supple.     Right lower leg: No edema.     Left lower leg: No edema.  Skin:    General: Skin is warm and dry.  Neurological:     Mental Status: She is alert and oriented to person, place, and time.  Psychiatric:        Mood and Affect: Mood normal.        Behavior: Behavior normal.        Thought Content: Thought content normal.        Judgment: Judgment normal.      No results found for any visits on 04/10/24.  Last CBC Lab Results  Component Value Date   WBC 4.2 02/05/2024   HGB 12.7 02/05/2024   HCT 38.2 02/05/2024   MCV 90.5 02/05/2024   MCH 30.1 12/31/2023   RDW 13.7 02/05/2024   PLT 272.0 02/05/2024   Last metabolic panel Lab Results  Component Value Date  GLUCOSE 79 02/05/2024   NA 138 02/05/2024   K 5.0 02/05/2024   CL 105 02/05/2024   CO2 29 02/05/2024   BUN 22 02/05/2024   CREATININE 1.05 02/05/2024   GFR 58.94 (L) 02/05/2024   CALCIUM  9.6 02/05/2024   PROT 7.2 02/05/2024   ALBUMIN 4.2 02/05/2024   BILITOT 0.4 02/05/2024   ALKPHOS 47 02/05/2024   AST 18 02/05/2024   ALT 15 02/05/2024   ANIONGAP 10 12/31/2023   Last lipids Lab Results  Component Value Date   CHOL 117 02/05/2024   HDL 32.70 (L) 02/05/2024   LDLCALC 70 02/05/2024   LDLDIRECT 90.0 03/17/2020   TRIG 72.0 02/05/2024   CHOLHDL 4 02/05/2024   Last hemoglobin A1c Lab Results  Component Value Date   HGBA1C 5.6 12/02/2013   Last thyroid  functions Lab Results  Component Value Date   TSH 0.49 02/05/2024   Last vitamin D  Lab Results  Component Value Date   VD25OH 10.51  (L) 02/05/2023   Last vitamin B12 and Folate Lab Results  Component Value Date   VITAMINB12 320 02/05/2023      The ASCVD Risk score (Arnett DK, et al., 2019) failed to calculate for the following reasons:   The valid total cholesterol range is 130 to 320 mg/dL    Assessment & Plan:   Problem List Items Addressed This Visit   None Visit Diagnoses       Dyspepsia    -  Primary   Relevant Medications   pantoprazole (PROTONIX) 20 MG tablet   Other Relevant Orders   CBC with Differential/Platelet   Comprehensive metabolic panel with GFR   H. pylori breath test     Nausea       Relevant Medications   ondansetron  (ZOFRAN -ODT) 4 MG disintegrating tablet     Assessment and Plan Assessment & Plan Nausea   She experiences persistent nausea since Monday with a gagging sensation, unrelieved by electrolyte drinks. There is no vomiting, chest pain, arm pain, or jaw pain. Stress may be a contributing factor. The differential includes gastrointestinal upset or stress-related symptoms. Administer Maalox and lidocaine to alleviate nausea and prescribe hyoscyamine (Donnatal) sublingual tablets. Consider lab evaluation if symptoms persist or worsen.  Heartburn   Acute heartburn began after consuming spicy food, specifically hot honey mustard chicken wings, and resolved with Tums. She has no prior history of heartburn and no chest pain, arm pain, or jaw pain. The likely cause is dietary. Advise avoidance of spicy foods and prescribe daily medication for heartburn management. Instruct her to seek hospital care if symptoms return and do not resolve with medication.  Stress   She is experiencing stress related to a friend's breast exam findings and personal health concerns, which may contribute to her gastrointestinal symptoms. Discuss stress management techniques and encourage relaxation exercises.    Return if symptoms worsen or fail to improve.    Kimothy Kishimoto R Lowne Chase, DO

## 2024-04-10 NOTE — Patient Instructions (Signed)
 GERD in Adults: Diet Changes When you have gastroesophageal reflux disease (GERD), you may need to make changes to your diet. Choosing the right foods can help with your symptoms. Think about working with an expert in healthy eating called a dietitian. They can help you make healthy food choices. What are tips for following this plan? Reading food labels Look for foods that are low in saturated fat. Foods that may help with your symptoms include: Foods with less than 5% of daily value (DV) of fat. Foods with 0 grams of trans fat. Cooking [employer] in ways that don't use a lot of fat. These ways include: Baking. Steaming. Grilling. Broiling. To add flavor, try to use herbs that are low in spice and acidity. Avoid frying your food. Meal planning  Eat small meals often rather than eating 3 large meals each day. Eat your meals slowly in a place where you feel relaxed. If told by your health care provider, avoid: Foods that cause symptoms. Keep a food diary to keep track of foods that cause symptoms. Alcohol. Drinking a lot of liquid with meals. General instructions For 2-3 hours after you eat, avoid: Bending over. Exercise. Lying down. Chew sugar-free gum after meals. What foods should I eat? Eat a healthy diet. Try to include: Foods with high amounts of fiber. These include: Fruits and vegetables. Whole grains and beans. Low-fat dairy products. Lean meats, fish, and poultry. Egg whites. Foods that cause symptoms in someone else may not cause symptoms for you. Work with your provider to find foods that are safe for you. The items listed above may not be all the foods and drinks you can have. Talk with a dietitian to learn more. The items listed above may not be a complete list of foods and beverages you can eat and drink. Contact a dietitian for more information. What foods should I avoid? Limiting some of these foods may help with your symptoms. Each person is different.  Talk with a dietitian or your provider to help you find the exact foods to avoid. Some of the foods to avoid may include: Fruits Fruits with a lot of acid in them. These may include citrus fruits, such as oranges, grapefruit, pineapple, and lemons. Vegetables Deep-fried vegetables, such as [place] fries. Vegetables, sauces, or toppings made with added fat and vegetables with acid in them. These may include tomatoes and tomato products, chili peppers, onions, garlic, and horseradish. Grains Pastries or quick breads with added fat. Meats and other proteins High-fat meats, such as fatty beef or pork, hot dogs, ribs, ham, sausage, salami, and bacon. Fried meat or protein, such as fried fish and fried chicken. Egg yolks. Fats and oils Butter. Margarine. Shortening. Ghee. Drinks Coffee and other drinks with caffeine in them. Fizzy and sugary drinks, such as soda and energy drinks. Fruit juice made with acidic fruits, such as orange or grapefruit. Tomato juice. Sweets and desserts Chocolate and cocoa. Donuts. Seasonings and condiments Mint, such as peppermint and spearmint. Condiments, herbs, or seasonings that cause symptoms. These may include curry, hot sauce, or vinegar-based salad dressings. The items listed above may not be all the foods and drinks you should avoid. Talk with a dietitian to learn more. Questions to ask your health care provider Changes to your diet and everyday life are often the first steps taken to manage symptoms of GERD. If these changes don't help, talk with your provider about taking medicines. Where to find more information International Foundation for Gastrointestinal Disorders:  aboutgerd.org This information is not intended to replace advice given to you by your health care provider. Make sure you discuss any questions you have with your health care provider. Document Revised: 08/07/2023 Document Reviewed: 02/21/2023 Elsevier Patient Education  2024 [employer].

## 2024-04-11 LAB — H. PYLORI BREATH TEST: H. pylori Breath Test: NOT DETECTED

## 2024-04-14 ENCOUNTER — Ambulatory Visit: Payer: Self-pay | Admitting: Family Medicine

## 2024-04-15 ENCOUNTER — Telehealth: Payer: Self-pay | Admitting: Cardiology

## 2024-04-15 DIAGNOSIS — R002 Palpitations: Secondary | ICD-10-CM

## 2024-04-15 NOTE — Telephone Encounter (Signed)
 Patient reports that she has been feeling her heart beat strong in her chest recently. She reports that she recently saw her PCP for lightheadedness, heartburn and nausea. She was told she possibly had a virus. Patient reports that she is feeling better but has continued to have these strong beats in her chest. Denies any chest pain or shortness of breath. Patient reports that it comes on suddenly and she gets flustered. She is able to relax and is subsides. She states that she does get worked up easily and has some stress. She states that she has cut back on her caffeine intake. She confirms she is taking metoprolol  50 mg daily. Advised that I would send a message to Dr. Elmira.

## 2024-04-15 NOTE — Telephone Encounter (Signed)
 Patient c/o Palpitations:  STAT if patient reporting lightheadedness, shortness of breath, or chest pain  How long have you had palpitations/irregular HR/ Afib? Are you having the symptoms now? 1 week but not every day, not today  Are you currently experiencing lightheadedness, SOB or CP? lightheaded  Do you have a history of afib (atrial fibrillation) or irregular heart rhythm? no  Have you checked your BP or HR? (document readings if available): pt just went to pcp and it was normal   Are you experiencing any other symptoms? no

## 2024-04-16 NOTE — Telephone Encounter (Signed)
 Contacted patient and advised. Zio monitor order placed to be mailed. Pt will contact office once received to have help with with placing monitor.

## 2024-04-16 NOTE — Telephone Encounter (Signed)
 Recommend 2-week ZIO monitor for palpitations.  Thanks MJP

## 2024-04-17 ENCOUNTER — Ambulatory Visit: Attending: Cardiology

## 2024-04-17 DIAGNOSIS — R002 Palpitations: Secondary | ICD-10-CM

## 2024-04-17 NOTE — Telephone Encounter (Signed)
 Monitor was enrolled to be mailed to patients house

## 2024-04-17 NOTE — Progress Notes (Unsigned)
 Enrolled patient for a 14 day Zio XT  monitor to be mailed to patients home

## 2024-04-30 ENCOUNTER — Other Ambulatory Visit: Payer: Self-pay | Admitting: Family Medicine

## 2024-05-19 DIAGNOSIS — R002 Palpitations: Secondary | ICD-10-CM

## 2024-05-21 ENCOUNTER — Ambulatory Visit: Payer: Self-pay | Admitting: Cardiology

## 2024-05-21 NOTE — Progress Notes (Signed)
 Episodes of rapid heart beat, mostly from top chamber of the heart (SVT). This is the likely cause of patient's symptoms. Recommend vagal maneuvers (splashing ice cold water on face, bearing down, strong cough) to abort these episodes. In addition, recommend diltiazem  30 mg tid prn for symptoms not controlled with vagal maneuvers. Recommend f/u me or APP in a few weeks to reassess symptoms. If not improved, will need EP referral for possible ablation.  Thanks MJP

## 2024-05-22 MED ORDER — DILTIAZEM HCL 30 MG PO TABS: 30.0000 mg | ORAL_TABLET | Freq: Three times a day (TID) | ORAL | 3 refills | Status: AC | PRN

## 2024-05-27 ENCOUNTER — Other Ambulatory Visit: Payer: Self-pay | Admitting: Family Medicine

## 2024-05-27 ENCOUNTER — Telehealth: Payer: Self-pay | Admitting: Cardiology

## 2024-05-27 NOTE — Telephone Encounter (Signed)
 Pt c/o medication issue:  1. Name of Medication: diltiazem  (CARDIZEM ) 30 MG tablet   2. How are you currently taking this medication (dosage and times per day)? As written   3. Are you having a reaction (difficulty breathing--STAT)? No   4. What is your medication issue? Pt would like to know if some changes could be made this medication is causing her to be sick to her stomach.

## 2024-05-27 NOTE — Telephone Encounter (Signed)
 Spoke to patient and she reports that she has been taking prn diltiazem  three times a day since Friday. Pt thought she was supposed to be taking it three times a day. Pt has been having episodes of feeling like she needs to close her eyes, hot flashes in her chest, and weakness. Spoke with Dr. Elmira and pt advised to hydrate and try to eat. Monitor for worsening symptoms and continuation of symptoms, if continues then pt advised to go to emergency room for evaluation.

## 2024-05-28 ENCOUNTER — Encounter: Payer: Self-pay | Admitting: Emergency Medicine

## 2024-05-28 ENCOUNTER — Emergency Department: Admission: EM | Admit: 2024-05-28 | Discharge: 2024-05-28 | Disposition: A | Discharge status: Home / Self Care

## 2024-05-28 ENCOUNTER — Other Ambulatory Visit: Payer: Self-pay

## 2024-05-28 ENCOUNTER — Emergency Department

## 2024-05-28 DIAGNOSIS — Z853 Personal history of malignant neoplasm of breast: Secondary | ICD-10-CM | POA: Insufficient documentation

## 2024-05-28 DIAGNOSIS — I10 Essential (primary) hypertension: Secondary | ICD-10-CM | POA: Diagnosis not present

## 2024-05-28 DIAGNOSIS — R0789 Other chest pain: Secondary | ICD-10-CM | POA: Insufficient documentation

## 2024-05-28 DIAGNOSIS — Z720 Tobacco use: Secondary | ICD-10-CM | POA: Diagnosis not present

## 2024-05-28 LAB — BASIC METABOLIC PANEL WITH GFR
Anion gap: 10 (ref 5–15)
BUN: 16 mg/dL (ref 6–20)
CO2: 25 mmol/L (ref 22–32)
Calcium: 9.5 mg/dL (ref 8.9–10.3)
Chloride: 105 mmol/L (ref 98–111)
Creatinine, Ser: 1.03 mg/dL — ABNORMAL HIGH (ref 0.44–1.00)
GFR, Estimated: >60 mL/min (ref >60)
Glucose, Bld: 89 mg/dL (ref 70–99)
Potassium: 3.8 mmol/L (ref 3.5–5.1)
Sodium: 140 mmol/L (ref 135–145)

## 2024-05-28 LAB — RESP PANEL BY RT-PCR (RSV, FLU A&B, COVID)  RVPGX2
Influenza A by PCR: NEGATIVE
Influenza B by PCR: NEGATIVE
Resp Syncytial Virus by PCR: NEGATIVE
SARS Coronavirus 2 by RT PCR: NEGATIVE

## 2024-05-28 LAB — CBC
HCT: 39 % (ref 36.0–46.0)
Hemoglobin: 12.9 g/dL (ref 12.0–15.0)
MCH: 29.7 pg (ref 26.0–34.0)
MCHC: 33.1 g/dL (ref 30.0–36.0)
MCV: 89.9 fL (ref 80.0–100.0)
Platelets: 272 K/uL (ref 150–400)
RBC: 4.34 MIL/uL (ref 3.87–5.11)
RDW: 12.7 % (ref 11.5–15.5)
WBC: 5.4 K/uL (ref 4.0–10.5)
nRBC: 0 % (ref 0.0–0.2)

## 2024-05-28 LAB — HEPATIC FUNCTION PANEL
ALT: 19 U/L (ref 0–44)
AST: 32 U/L (ref 15–41)
Albumin: 3.9 g/dL (ref 3.5–5.0)
Alkaline Phosphatase: 39 U/L (ref 38–126)
Bilirubin, Direct: 0.1 mg/dL (ref 0.0–0.2)
Indirect Bilirubin: 0.4 mg/dL (ref 0.3–0.9)
Total Bilirubin: 0.5 mg/dL (ref 0.0–1.2)
Total Protein: 7.6 g/dL (ref 6.5–8.1)

## 2024-05-28 LAB — TROPONIN I (HIGH SENSITIVITY)
Troponin I (High Sensitivity): 4 ng/L (ref <18)
Troponin I (High Sensitivity): 4 ng/L (ref <18)

## 2024-05-28 LAB — LIPASE, BLOOD: Lipase: 23 U/L (ref 11–51)

## 2024-05-28 MED ORDER — ALUM & MAG HYDROXIDE-SIMETH 200-200-20 MG/5ML PO SUSP
30.0000 mL | Freq: Once | ORAL | Status: AC
  Administered 2024-05-28: 30 mL via ORAL
  Filled 2024-05-28: qty 30

## 2024-05-28 MED ORDER — OMEPRAZOLE MAGNESIUM 20 MG PO TBEC: 20.0000 mg | DELAYED_RELEASE_TABLET | Freq: Every day | ORAL | 0 refills | Status: AC

## 2024-05-28 MED ORDER — LIDOCAINE VISCOUS HCL 2 % MT SOLN
15.0000 mL | Freq: Once | OROMUCOSAL | Status: AC
  Administered 2024-05-28: 15 mL via ORAL
  Filled 2024-05-28: qty 15

## 2024-05-28 MED ORDER — PANTOPRAZOLE SODIUM 20 MG PO TBEC
20.0000 mg | DELAYED_RELEASE_TABLET | Freq: Once | ORAL | Status: AC
  Administered 2024-05-28: 20 mg via ORAL
  Filled 2024-05-28: qty 1

## 2024-05-28 MED ORDER — ONDANSETRON 4 MG PO TBDP
4.0000 mg | ORAL_TABLET | Freq: Once | ORAL | Status: AC
  Administered 2024-05-28: 4 mg via ORAL
  Filled 2024-05-28: qty 1

## 2024-05-28 MED ORDER — ALUMINUM-MAGNESIUM-SIMETHICONE 200-200-20 MG/5ML PO SUSP: 30.0000 mL | Freq: Three times a day (TID) | ORAL | 0 refills | Status: AC

## 2024-05-28 NOTE — ED Triage Notes (Signed)
 Patient to ED via POV for centralized CP since Friday. States burning pain. States she had vomiting on Friday and nausea since then. Hx: SVT

## 2024-05-28 NOTE — ED Provider Notes (Signed)
 [facility] Provider Note    Event Date/Time   First MD Initiated Contact with Patient 05/28/24 1354     (approximate)   History   Chest Pain  Patient to ED via POV for centralized CP since Friday. States burning pain. States she had vomiting on Friday and nausea since then. Hx: SVT   HPI [name] is a 58 y.o. female PMH hyperlipidemia, tobacco use, SVT, hypertension, history of breast cancer presents for evaluation of chest discomfort, nausea - Patient notes that she went out to eat on Friday evening, since then has been having ongoing nausea and episodes of a burning sensation in her mid chest.  Has vomited as well, nonbloody/nonbilious.  No abdominal pain.  Denies any frank chest pain.  No shortness of breath.  No radiation of discomfort. - Called her clinic and was told to come to the emergency department for eval - Did have some diarrhea on Friday/Saturday.  No black or bloody stools. - Otherwise just feels generalized fatigue - No significant palpitations, no shortness of breath, no pleuritic discomfort  Per chart review, patient had a cardiac CT in April of this year, results below, minimal nonobstructive CAD.  Recently seen by cardiology for palpitations, cardiac monitor showing brief episodes of SVT.  Has been given diltiazem  as needed.  IMPRESSION: 1.  Coronary calcium  score of 0. 2. Total plaque volume 86mm3 which is 21st percentile for age and sex-matched controls (calcified plaque 61mm3; noncalcified plaque 48mm3). TPV is mild 3.  Normal coronary origin with right dominance. 4. Nonobstructive CAD, with noncalcified plaque in mid LCX causing minimal (0-24%) stenosis   CAD-RADS 1. Minimal non-obstructive CAD (0-24%). Consider non-atherosclerotic causes of chest pain. Consider preventive therapy and risk factor modification.  Appears she also had a normal exercise stress test in 2019.     Physical Exam   Triage Vital Signs: ED Triage  Vitals  Encounter Vitals Group     BP 05/28/24 1204 (!) 127/94     Girls Systolic BP Percentile --      Girls Diastolic BP Percentile --      Boys Systolic BP Percentile --      Boys Diastolic BP Percentile --      Pulse Rate 05/28/24 1204 90     Resp 05/28/24 1204 18     Temp 05/28/24 1204 98.4 F (36.9 C)     Temp Source 05/28/24 1204 Oral     SpO2 05/28/24 1204 98 %     Weight 05/28/24 1205 135 lb (61.2 kg)     Height 05/28/24 1205 5' 7 (1.702 m)     Head Circumference --      Peak Flow --      Pain Score 05/28/24 1205 5     Pain Loc --      Pain Education --      Exclude from Growth Chart --     Most recent vital signs: Vitals:   05/28/24 1353 05/28/24 1500  BP:  121/68  Pulse:  88  Resp:  18  Temp:    SpO2: 99% 100%     General: Awake, no distress.  CV:  Good peripheral perfusion. RRR, RP 2+ Resp:  Normal effort. CTAB Abd:  No distention. Nontender to deep palpation throughout Other:     ED Results / Procedures / Treatments   Labs (all labs ordered are listed, but only abnormal results are displayed) Labs Reviewed  BASIC METABOLIC PANEL WITH GFR -  Abnormal; Notable for the following components:      Result Value   Creatinine, Ser 1.03 (*)    All other components within normal limits  RESP PANEL BY RT-PCR (RSV, FLU A&B, COVID)  RVPGX2  CBC  HEPATIC FUNCTION PANEL  LIPASE, BLOOD  TROPONIN I (HIGH SENSITIVITY)  TROPONIN I (HIGH SENSITIVITY)     EKG  Ecg = sinus rhythm, rate 91, no gross ST elevation or depression, no significant repolarization abnormality (isolated biphasic T waves in V4 is nonspecific), normal axis, normal intervals.  No evidence of ischemia nor arrhythmia on my interpretation.  Appears similar to prior from March 2025.  RADIOLOGY See ED course below. Radiology interpreted by myself and radiology report reviewed.    PROCEDURES:  Critical Care performed: No  Procedures   MEDICATIONS ORDERED IN ED: Medications  alum &  mag hydroxide-simeth (MAALOX/MYLANTA) 200-200-20 MG/5ML suspension 30 mL (30 mLs Oral Given 05/28/24 1431)    And  lidocaine  (XYLOCAINE ) 2 % viscous mouth solution 15 mL (15 mLs Oral Given 05/28/24 1431)  ondansetron  (ZOFRAN -ODT) disintegrating tablet 4 mg (4 mg Oral Given 05/28/24 1431)  pantoprazole  (PROTONIX ) EC tablet 20 mg (20 mg Oral Given 05/28/24 1453)     IMPRESSION / MDM / ASSESSMENT AND PLAN / ED COURSE  I reviewed the triage vital signs and the nursing notes.                              DDX/MDM/AP: Differential diagnosis includes, but is not limited to, viral syndrome including influenza or COVID-19, consider GERD/dyspepsia/gastritis, consider ACS, doubt pneumothorax aortic dissection, very benign abdominal exam here--do not suspect acute intra-abdominal pathology.  Do not clinically suspect PE at this time.  Plan: - Labs - EKG - Chest x-ray - Maalox, viscous lidocaine , pantoprazole  - Reassess  Patient's presentation is most consistent with acute presentation with potential threat to life or bodily function.  The patient is on the cardiac monitor to evaluate for evidence of arrhythmia and/or significant heart rate changes.  ED course below.  Serial troponins stable, workup reassuring.  Patient notes she did have another brief episode of the fluttering/burning sensation while here.  Suspect possible symptoms from known intermittent episodes of SVT versus dyspepsia, highly doubt cardiac ischemia at this time and no ongoing symptoms.  In shared decision making, patient is comfortable with discharge home and plan to follow-up with her cardiologist and PMD.  Will start on omeprazole  and Maalox in the meantime and encouraged her to continue with her already prescribed diltiazem  for SVT.  ED return precautions in place.  Patient agrees with plan.  Clinical Course as of 05/28/24 1629  Wed May 28, 2024  1359 CBC unremarkable BMP unremarkable, creatinine at baseline range [MM]  1359  Troponin normal [MM]  1359 Chest x-ray reviewed, unremarkable my interpretation  IMPRESSION: No acute cardiopulmonary findings.   [MM]    Clinical Course User Index [MM] Clarine Ozell LABOR, MD     FINAL CLINICAL IMPRESSION(S) / ED DIAGNOSES   Final diagnoses:  Chest discomfort     Rx / DC Orders   ED Discharge Orders          Ordered    omeprazole  (PRILOSEC  OTC) 20 MG tablet  Daily        05/28/24 1628    aluminum -magnesium  hydroxide-simethicone  (MAALOX) 200-200-20 MG/5ML SUSP  3 times daily before meals & bedtime        05/28/24 1628  Note:  This document was prepared using Dragon voice recognition software and may include unintentional dictation errors.   Clarine Ozell LABOR, MD 05/28/24 [phone]

## 2024-05-28 NOTE — Discharge Instructions (Addendum)
 Your evaluation in the emergency department was overall reassuring.  I suspect you may be having symptoms from episodes of SVT or possible irritation of the lining of your stomach.  I have prescribed antacid medications to use over the next several days, I recommend you continue with your diltiazem  that was already prescribed by your cardiologist.  Please follow-up with your radiologist and primary care provider for reevaluation, and return to the emergency department with any new or worsening symptoms.

## 2024-05-29 ENCOUNTER — Encounter: Payer: Self-pay | Admitting: Cardiology

## 2024-05-29 ENCOUNTER — Ambulatory Visit: Attending: Cardiology | Admitting: Cardiology

## 2024-05-29 VITALS — BP 122/78 | HR 99 | Ht 66.0 in | Wt 141.8 lb

## 2024-05-29 DIAGNOSIS — E782 Mixed hyperlipidemia: Secondary | ICD-10-CM | POA: Diagnosis not present

## 2024-05-29 DIAGNOSIS — I2510 Atherosclerotic heart disease of native coronary artery without angina pectoris: Secondary | ICD-10-CM | POA: Insufficient documentation

## 2024-05-29 DIAGNOSIS — I471 Supraventricular tachycardia, unspecified: Secondary | ICD-10-CM | POA: Diagnosis not present

## 2024-05-29 NOTE — Patient Instructions (Signed)

## 2024-05-29 NOTE — Progress Notes (Signed)
 Cardiology Office Note:  .   Date:  05/29/2024  ID:  Mercy LITTIE Molt, DOB 07-20-1966, MRN [id number] PCP: Antonio Meth, Jamee SAUNDERS, DO   [facility] Prov[facility] Cardiologist:  Newman Lawrence, MD PCP: Antonio Meth, Jamee SAUNDERS, DO  Chief Complaint  Patient presents with   SVT   CHEST DISCOMFORT      [name] is a 58 y.o. female with hyperlipidemia, former smoker, mild nonobstructive coronary artery disease, PSVT/atrial tachycardia, h/o breast cancer s/p b/l mastectomies  Patient recently experienced palpitations, 2-week Zio patch showed SVT/atrial tachycardia up to 20 seconds.  Patient was started on metoprolol  succinate 50 mg daily and diltiazem  30 mg 3 times daily as needed.  However, it appears that patient was taking diltiazem  as 30 mg 3 times daily every day.  This was associated with lightheadedness symptoms, which have since improved after stopping scheduled use of diltiazem .  Discussed recent coronary CT angiogram results with the patient, details below.  Patient got anxious with all the recent tests and lightheaded symptoms and went to the ER yesterday, acute abnormalities were ruled out  Vitals:   05/29/24 1543  BP: 122/78  Pulse: 99  SpO2: 95%       Review of Systems  Cardiovascular:  Negative for chest pain, dyspnea on exertion, leg swelling, palpitations and syncope.        Studies Reviewed: SABRA        Labs 4-07/2023: Chol 115, TG 80, HDL 33, LDL 65 Hb 12.9 Cr 1.0 TSH 0.5  Zio patch monitor 14 days 04/20/2024 - 05/04/2024: Dominant rhythm: Sinus. HR 57-138 bpm. Avg HR 84 bpm. 13 episodes of SVT/atrial tachycardia, fastest at 167 bpm for 4 beats, longest for 20.2 secs at 116 bpm. 1% isolated SVE, <1% couplet/triplets. 2 episodes of VT, fastest at 164 bpm for 5 beats, longest for 5 beats at 125 bpm. <1% isolated VE, couplet/triplets. No atrial fibrillation/atrial flutter/high grade AV block, sinus pause >3sec noted. 4 patient triggered events,  correlated with SVE.  Physical Exam Vitals and nursing note reviewed.  Constitutional:      General: She is not in acute distress. Neck:     Vascular: No JVD.  Cardiovascular:     Rate and Rhythm: Normal rate and regular rhythm.     Heart sounds: Normal heart sounds. No murmur heard. Pulmonary:     Effort: Pulmonary effort is normal.     Breath sounds: Normal breath sounds. No wheezing or rales.  Musculoskeletal:     Right lower leg: No edema.     Left lower leg: No edema.      VISIT DIAGNOSES:   ICD-10-CM   1. PSVT (paroxysmal supraventricular tachycardia) (HCC)  I47.10     2. Mixed hyperlipidemia  E78.2     3. Nonobstructive atherosclerosis of coronary artery  I25.10         [name] is a 58 y.o. female with hyperlipidemia, former smoker, mild nonobstructive coronary artery disease, PSVT/atrial tachycardia, h/o breast cancer s/p b/l mastectomies  Assessment & Plan  PSVT/atrial tachycardia: Recommend vagal maneuvers, metoprolol  succinate 50 mg daily, diltiazem  30 mg 3 times daily as needed. Avoid triggers such as excess alcohol, caffeine, lack of sleep, stress etc. If symptoms do not improve with above management, could consider referral to EP.  Precordial pain: Only mild nonobstructive CAD on coronary CT angiogram.  She is now on omeprazole  for possible GERD.  Follow-up with PCP. Continue Lipitor 20 mg daily. Given only mild CAD noted,  reasonable to omit aspirin .   F/u in 6 months    Signed, Newman JINNY Lawrence, MD

## 2024-07-02 ENCOUNTER — Ambulatory Visit: Admitting: Physician Assistant

## 2024-07-11 ENCOUNTER — Other Ambulatory Visit: Payer: Self-pay | Admitting: Family Medicine

## 2024-07-11 DIAGNOSIS — E785 Hyperlipidemia, unspecified: Secondary | ICD-10-CM

## 2024-07-11 MED ORDER — FENOFIBRATE 160 MG PO TABS
160.0000 mg | ORAL_TABLET | Freq: Every day | ORAL | 1 refills | Status: AC
Start: 2024-07-11 — End: ?

## 2024-07-11 NOTE — Telephone Encounter (Signed)
 Copied from CRM #[id number]. Topic: Clinical - Medication Refill >> Jul 11, 2024  1:50 PM Burnard DEL wrote: Medication: fenofibrate  160 MG tablet  Has the patient contacted their pharmacy? Yes (Agent: If no, request that the patient contact the pharmacy for the refill. If patient does not wish to contact the pharmacy document the reason why and proceed with request.) (Agent: If yes, when and what did the pharmacy advise?)  This is the patient's preferred pharmacy:  [id number] - [place], [place] - [address] AT [facility] [address] & [address] [place]  [address] Phone: [phone] Fax: [phone]    Is this the correct pharmacy for this prescription? Yes If no, delete pharmacy and type the correct one.   Has the prescription been filled recently? No  Is the patient out of the medication? No  Has the patient been seen for an appointment in the last year OR does the patient have an upcoming appointment? Yes  Can we respond through MyChart? Yes  Agent: Please be advised that Rx refills may take up to 3 business days. We ask that you follow-up with your pharmacy.

## 2024-07-29 ENCOUNTER — Ambulatory Visit: Payer: Self-pay | Admitting: *Deleted

## 2024-07-29 NOTE — Telephone Encounter (Signed)
 Left message on machine for patient to call back to schedule an appointment or head to urgent care now.

## 2024-07-29 NOTE — Telephone Encounter (Signed)
 Patient requesting call back regarding appt. Will be coming to [facility] today but no available appt today with providers. Did not want appt at another location. Recommended to contact cardiology to see if appt available. See NT encounter. Sx comes and goes. No lightheadedness now.     FYI Only or Action Required?: Action required by provider: request for appointment and requesting appt today .  Patient was last seen in primary care on 04/10/2024 by Antonio Meth, Jamee SAUNDERS, DO.  Called Nurse Triage reporting Dizziness.  Symptoms began yesterday.  Interventions attempted: Rest, hydration, or home remedies.  Symptoms are: stable.  Triage Disposition: See Physician Within 24 Hours  Patient/caregiver understands and will follow disposition?: No, wishes to speak with PCP               Copied from CRM #[id number]. Topic: Clinical - Red Word Triage >> Jul 29, 2024  8:11 AM Rosina BIRCH wrote: Red Word that prompted transfer to Nurse Triage: light headed off and on Reason for Disposition  Taking a medicine that could cause dizziness (e.g., blood pressure medications, diuretics)  Answer Assessment - Initial Assessment Questions No sx now.  Requesting appt today due to coming to [facility] and is in [place] now . Patient requesting call back before scheduling appt for tomorrow. Please advise. Recommended if sx worsen go to UC.       1. DESCRIPTION: Describe your dizziness.     lightheaded 2. LIGHTHEADED: Do you feel lightheaded? (e.g., somewhat faint, woozy, weak upon standing)     Standing ok lightheadedness comes and goes 3. VERTIGO: Do you feel like either you or the room is spinning or tilting? (i.e., vertigo)     na 4. SEVERITY: How bad is it?  Do you feel like you are going to faint? Can you stand and walk?     Not bad nothing hurts. Can happen 10 minutes and goes away. 5. ONSET:  When did the dizziness begin?     Sunday  6. AGGRAVATING FACTORS: Does  anything make it worse? (e.g., standing, change in head position)     Nothing  7. HEART RATE: Can you tell me your heart rate? How many beats in 15 seconds?  (Note: Not all patients can do this.)       Na  8. CAUSE: What do you think is causing the dizziness? (e.g., decreased fluids or food, diarrhea, emotional distress, heat exposure, new medicine, sudden standing, vomiting; unknown)     Not sure hx ar 9. RECURRENT SYMPTOM: Have you had dizziness before? If Yes, ask: When was the last time? What happened that time?     Yes  10. OTHER SYMPTOMS: Do you have any other symptoms? (e.g., fever, chest pain, vomiting, diarrhea, bleeding)       No chest pain , nausea feeling, not lightheaded now.  11. PREGNANCY: Is there any chance you are pregnant? When was your last menstrual period?       na  Protocols used: Dizziness - Lightheadedness-A-AH

## 2024-08-21 ENCOUNTER — Encounter: Payer: Self-pay | Admitting: Rehabilitation

## 2024-08-25 ENCOUNTER — Telehealth: Payer: Self-pay | Admitting: Rehabilitation

## 2024-08-25 NOTE — Telephone Encounter (Signed)
 Created an order to enter onto [facility] [employer] for an exostrong sleeve and glove class 1 per pt instruction via Eward Sharps, PT.

## 2024-08-27 ENCOUNTER — Telehealth: Payer: Self-pay | Admitting: Rehabilitation

## 2024-08-27 NOTE — Telephone Encounter (Signed)
 Pt called to let us  know that her insurance will not cover the compression garments through [facility] [employer].  She will either make a payment plan with [employer] or go check in with [facility] again.

## 2024-09-08 ENCOUNTER — Other Ambulatory Visit: Payer: Self-pay

## 2024-09-08 MED ORDER — METOPROLOL SUCCINATE ER 50 MG PO TB24: 50.0000 mg | ORAL_TABLET | Freq: Every day | ORAL | 0 refills | Status: AC

## 2024-10-10 ENCOUNTER — Ambulatory Visit: Admitting: Family Medicine

## 2024-10-10 ENCOUNTER — Encounter: Payer: Self-pay | Admitting: Family Medicine

## 2024-10-10 VITALS — BP 102/80 | HR 89 | Temp 98.4°F | Resp 16 | Ht 66.0 in | Wt 146.4 lb

## 2024-10-10 DIAGNOSIS — R0683 Snoring: Secondary | ICD-10-CM

## 2024-10-10 DIAGNOSIS — I10 Essential (primary) hypertension: Secondary | ICD-10-CM | POA: Diagnosis not present

## 2024-10-10 DIAGNOSIS — E785 Hyperlipidemia, unspecified: Secondary | ICD-10-CM | POA: Diagnosis not present

## 2024-10-10 LAB — COMPREHENSIVE METABOLIC PANEL WITH GFR
ALT: 18 U/L (ref 3–35)
AST: 21 U/L (ref 5–37)
Albumin: 4.2 g/dL (ref 3.5–5.2)
Alkaline Phosphatase: 45 U/L (ref 39–117)
BUN: 17 mg/dL (ref 6–23)
CO2: 30 meq/L (ref 19–32)
Calcium: 9.8 mg/dL (ref 8.4–10.5)
Chloride: 105 meq/L (ref 96–112)
Creatinine, Ser: 1.26 mg/dL — ABNORMAL HIGH (ref 0.40–1.20)
GFR: 47.13 mL/min — ABNORMAL LOW
Glucose, Bld: 74 mg/dL (ref 70–99)
Potassium: 5.2 meq/L — ABNORMAL HIGH (ref 3.5–5.1)
Sodium: 140 meq/L (ref 135–145)
Total Bilirubin: 0.4 mg/dL (ref 0.2–1.2)
Total Protein: 7.2 g/dL (ref 6.0–8.3)

## 2024-10-10 LAB — LIPID PANEL
Cholesterol: 110 mg/dL (ref 28–200)
HDL: 29.1 mg/dL — ABNORMAL LOW
LDL Cholesterol: 57 mg/dL (ref 10–99)
NonHDL: 80.57
Total CHOL/HDL Ratio: 4
Triglycerides: 116 mg/dL (ref 10.0–149.0)
VLDL: 23.2 mg/dL (ref 0.0–40.0)

## 2024-10-10 LAB — CBC WITH DIFFERENTIAL/PLATELET
Basophils Absolute: 0 K/uL (ref 0.0–0.1)
Basophils Relative: 0.9 % (ref 0.0–3.0)
Eosinophils Absolute: 0.2 K/uL (ref 0.0–0.7)
Eosinophils Relative: 4.3 % (ref 0.0–5.0)
HCT: 36.8 % (ref 36.0–46.0)
Hemoglobin: 12.3 g/dL (ref 12.0–15.0)
Lymphocytes Relative: 33.2 % (ref 12.0–46.0)
Lymphs Abs: 1.4 K/uL (ref 0.7–4.0)
MCHC: 33.3 g/dL (ref 30.0–36.0)
MCV: 89.2 fl (ref 78.0–100.0)
Monocytes Absolute: 0.4 K/uL (ref 0.1–1.0)
Monocytes Relative: 11 % (ref 3.0–12.0)
Neutro Abs: 2.1 K/uL (ref 1.4–7.7)
Neutrophils Relative %: 50.6 % (ref 43.0–77.0)
Platelets: 250 K/uL (ref 150.0–400.0)
RBC: 4.13 Mil/uL (ref 3.87–5.11)
RDW: 13.9 % (ref 11.5–15.5)
WBC: 4.1 K/uL (ref 4.0–10.5)

## 2024-10-10 LAB — TSH: TSH: 0.69 u[IU]/mL (ref 0.35–5.50)

## 2024-10-10 NOTE — Progress Notes (Signed)
 "  Subjective:    Patient ID: [name], female    DOB: Apr 13, 1966, 59 y.o.   MRN: [id number]  Chief Complaint  Patient presents with   Referral    HPI Patient is in today for snoring.   Discussed the use of AI scribe software for clinical note transcription with the patient, who gave verbal consent to proceed.  History of Present Illness [name] is a 59 year old female who presents with concerns about snoring and possible sleep apnea.  Her daughter has noted episodes of snoring and has reported that it sounds like she stops breathing during sleep.  She experiences fragmented sleep with frequent nocturnal awakenings, typically waking around 11 PM to use the bathroom or get a drink, and does not achieve a full eight hours of continuous sleep.  She is currently taking metoprolol , atorvastatin , and fenofibrate . She does not take escitalopram  or other medications for anxiety, managing her worries through other means.  There is a significant family history of breast cancer. She had breast cancer at age 89, and her two sisters were diagnosed in their fifties. Her younger sister was recently diagnosed with stage one breast cancer and has undergone a biopsy and removal of the tumor.  She has not had a flu shot this year and prefers not to get one. She has been cautious about exposure to illnesses, wearing a mask when going out, especially since she plans to return to work soon.  No pain or other bothersome symptoms beyond the snoring and sleep disturbances.    Past Medical History:  Diagnosis Date   Anxiety    Breast cancer [facility]) 1999   2004   Fatigue    Headache    History of exercise stress test    GXT 9/19:  Exercised 5'48; METs: 7; Normal BP response; No ischemic EKG changes   Hyperlipidemia    Lightheadedness    Numbness    Tobacco abuse     Past Surgical History:  Procedure Laterality Date   ABDOMINAL HYSTERECTOMY  2006   TAH,BSO secondary bleeding and breast  cancer--   BREAST SURGERY     CORNEAL TRANSPLANT Bilateral    6x, started in 1999, last was 2006.   EYE SURGERY     MASTECTOMY     bilateral---1999/2004    Family History  Problem Relation Age of Onset   Aortic aneurysm Mother    Stroke Sister 39       after starting OCP   Diabetes Sister    Breast cancer Sister    Hyperlipidemia Sister    Breast cancer Sister    Diabetes Mellitus I Sister    Multiple sclerosis Nephew    Aortic aneurysm Other    Hypertension Other    Seizures Neg Hx    Migraines Neg Hx     Social History   Socioeconomic History   Marital status: Single    Spouse name: Not on file   Number of children: 1   Years of education: 16   Highest education level: Not on file  Occupational History   Occupation: works for [employer]; office specialist  Tobacco Use   Smoking status: Some Days    Current packs/day: 0.25    Average packs/day: 0.3 packs/day for 27.0 years (6.8 ttl pk-yrs)    Types: Cigarettes   Smokeless tobacco: Never   Tobacco comments:    Trying to quit  Vaping Use   Vaping status: Never Used  Substance  and Sexual Activity   Alcohol use: Yes    Alcohol/week: 0.0 standard drinks of alcohol    Comment: Rare   Drug use: No   Sexual activity: Not Currently  Other Topics Concern   Not on file  Social History Narrative   Marital status: single      Children: one daughter      Lives: with sister      Employment: works for [employer]; office specialist      Education: Pharmacologist      Tobacco: trying to quit      Alcohol: none; rare      Drugs: none      Exercise: walking daily 2 miles      Caffeine use: Tea/pepsi- 1 pepsi/morning      Tea- 3 cups per day   Right-handed   Social Drivers of Health   Tobacco Use: High Risk (10/10/2024)   Patient History    Smoking Tobacco Use: Some Days    Smokeless Tobacco Use: Never    Passive Exposure: Not on file  Financial Resource Strain: Not on file   Food Insecurity: Not on file  Transportation Needs: Not on file  Physical Activity: Not on file  Stress: Not on file  Social Connections: Not on file  Intimate Partner Violence: Not on file  Depression (PHQ2-9): Low Risk (02/05/2024)   Depression (PHQ2-9)    PHQ-2 Score: 0  Alcohol Screen: Not on file  Housing: Not on file  Utilities: Not on file  Health Literacy: Not on file    Outpatient Medications Prior to Visit  Medication Sig Dispense Refill   aluminum -magnesium  hydroxide-simethicone  (MAALOX) 200-200-20 MG/5ML SUSP Take 30 mLs by mouth 4 (four) times daily -  before meals and at bedtime. 1680 mL 0   atorvastatin  (LIPITOR) 20 MG tablet Take 1 tablet (20 mg total) by mouth daily.     diltiazem  (CARDIZEM ) 30 MG tablet Take 1 tablet (30 mg total) by mouth 3 (three) times daily as needed. 30 tablet 3   fenofibrate  160 MG tablet Take 1 tablet (160 mg total) by mouth daily. 90 tablet 1   metoprolol  succinate (TOPROL -XL) 50 MG 24 hr tablet Take 1 tablet (50 mg total) by mouth daily. Take with or immediately following a meal. 90 tablet 0   omeprazole  (PRILOSEC  OTC) 20 MG tablet Take 1 tablet (20 mg total) by mouth daily for 14 days. 14 tablet 0   ondansetron  (ZOFRAN -ODT) 4 MG disintegrating tablet Take 1 tablet (4 mg total) by mouth every 8 (eight) hours as needed for nausea or vomiting. 20 tablet 0   escitalopram  (LEXAPRO ) 10 MG tablet Take 1 tablet (10 mg total) by mouth daily. (Patient not taking: Reported on 10/10/2024) 30 tablet 2   pantoprazole  (PROTONIX ) 20 MG tablet Take 1 tablet (20 mg total) by mouth daily. (Patient not taking: Reported on 10/10/2024) 30 tablet 6   No facility-administered medications prior to visit.    Allergies[1]  Review of Systems  Constitutional:  Negative for fever and malaise/fatigue.  HENT:  Negative for congestion.   Eyes:  Negative for blurred vision.  Respiratory:  Negative for shortness of breath.   Cardiovascular:  Negative for chest pain,  palpitations and leg swelling.  Gastrointestinal:  Negative for abdominal pain, blood in stool and nausea.  Genitourinary:  Negative for dysuria and frequency.  Musculoskeletal:  Negative for falls.  Skin:  Negative for rash.  Neurological:  Negative for dizziness, loss of consciousness  and headaches.  Endo/Heme/Allergies:  Negative for environmental allergies.  Psychiatric/Behavioral:  Negative for depression. The patient has insomnia. The patient is not nervous/anxious.        Objective:    Physical Exam Vitals and nursing note reviewed.  Constitutional:      General: She is not in acute distress.    Appearance: Normal appearance. She is well-developed.  HENT:     Head: Normocephalic and atraumatic.  Eyes:     General: No scleral icterus.       Right eye: No discharge.        Left eye: No discharge.  Cardiovascular:     Rate and Rhythm: Normal rate and regular rhythm.     Heart sounds: No murmur heard. Pulmonary:     Effort: Pulmonary effort is normal. No respiratory distress.     Breath sounds: Normal breath sounds.  Musculoskeletal:        General: Normal range of motion.     Cervical back: Normal range of motion and neck supple.     Right lower leg: No edema.     Left lower leg: No edema.  Skin:    General: Skin is warm and dry.  Neurological:     Mental Status: She is alert and oriented to person, place, and time.  Psychiatric:        Mood and Affect: Mood normal.        Behavior: Behavior normal.        Thought Content: Thought content normal.        Judgment: Judgment normal.     BP 102/80 (BP Location: Right Arm, Patient Position: Sitting, Cuff Size: Normal)   Pulse 89   Temp 98.4 F (36.9 C) (Oral)   Resp 16   Ht 5' 6 (1.676 m)   Wt 146 lb 6.4 oz (66.4 kg)   SpO2 96%   BMI 23.63 kg/m  Wt Readings from Last 3 Encounters:  10/10/24 146 lb 6.4 oz (66.4 kg)  05/29/24 141 lb 12.8 oz (64.3 kg)  05/28/24 135 lb (61.2 kg)    Diabetic Foot Exam -  Simple   No data filed    Lab Results  Component Value Date   WBC 5.4 05/28/2024   HGB 12.9 05/28/2024   HCT 39.0 05/28/2024   PLT 272 05/28/2024   GLUCOSE 89 05/28/2024   CHOL 117 02/05/2024   TRIG 72.0 02/05/2024   HDL 32.70 (L) 02/05/2024   LDLDIRECT 90.0 03/17/2020   LDLCALC 70 02/05/2024   ALT 19 05/28/2024   AST 32 05/28/2024   NA 140 05/28/2024   K 3.8 05/28/2024   CL 105 05/28/2024   CREATININE 1.03 (H) 05/28/2024   BUN 16 05/28/2024   CO2 25 05/28/2024   TSH 0.49 02/05/2024   HGBA1C 5.6 12/02/2013   MICROALBUR 0.50 07/18/2013    Lab Results  Component Value Date   TSH 0.49 02/05/2024   Lab Results  Component Value Date   WBC 5.4 05/28/2024   HGB 12.9 05/28/2024   HCT 39.0 05/28/2024   MCV 89.9 05/28/2024   PLT 272 05/28/2024   Lab Results  Component Value Date   NA 140 05/28/2024   K 3.8 05/28/2024   CO2 25 05/28/2024   GLUCOSE 89 05/28/2024   BUN 16 05/28/2024   CREATININE 1.03 (H) 05/28/2024   BILITOT 0.5 05/28/2024   ALKPHOS 39 05/28/2024   AST 32 05/28/2024   ALT 19 05/28/2024   PROT 7.6 05/28/2024  ALBUMIN 3.9 05/28/2024   CALCIUM  9.5 05/28/2024   ANIONGAP 10 05/28/2024   GFR 51.17 (L) 04/10/2024   Lab Results  Component Value Date   CHOL 117 02/05/2024   Lab Results  Component Value Date   HDL 32.70 (L) 02/05/2024   Lab Results  Component Value Date   LDLCALC 70 02/05/2024   Lab Results  Component Value Date   TRIG 72.0 02/05/2024   Lab Results  Component Value Date   CHOLHDL 4 02/05/2024   Lab Results  Component Value Date   HGBA1C 5.6 12/02/2013       Assessment & Plan:  Snoring -     Pulmonary Visit  Hyperlipidemia, unspecified hyperlipidemia type -     Comprehensive metabolic panel with GFR -     Lipid panel -     TSH  Primary hypertension -     CBC with Differential/Platelet -     TSH  Assessment and Plan Assessment & Plan Suspected sleep apnea   She reports snoring and episodes of apnea during  sleep, as observed by her daughter, along with fragmented sleep and frequent awakenings. These symptoms suggest sleep apnea. Referred to a pulmonary specialist in [place] for evaluation and potential home sleep study.  Primary hypertension   Her blood pressure is well-controlled on the current medication regimen.  Hyperlipidemia   She is on atorvastatin  and fenofibrate . Cholesterol levels have not been checked recently. Ordered a blood test to assess cholesterol levels.  General health maintenance   She declined the flu vaccination this year.    Mary JONELLE Shanks Chase, DO     [1]  Allergies Allergen Reactions   Penicillins Other (See Comments)    Childhood reaction   "

## 2024-10-13 ENCOUNTER — Ambulatory Visit: Payer: Self-pay | Admitting: Family Medicine

## 2024-10-13 DIAGNOSIS — E875 Hyperkalemia: Secondary | ICD-10-CM

## 2024-10-17 ENCOUNTER — Other Ambulatory Visit

## 2024-10-17 DIAGNOSIS — E875 Hyperkalemia: Secondary | ICD-10-CM | POA: Diagnosis not present

## 2024-10-17 DIAGNOSIS — E785 Hyperlipidemia, unspecified: Secondary | ICD-10-CM

## 2024-10-17 LAB — BASIC METABOLIC PANEL WITH GFR
BUN: 22 mg/dL (ref 6–23)
CO2: 30 meq/L (ref 19–32)
Calcium: 10 mg/dL (ref 8.4–10.5)
Chloride: 103 meq/L (ref 96–112)
Creatinine, Ser: 1.04 mg/dL (ref 0.40–1.20)
GFR: 59.33 mL/min — ABNORMAL LOW
Glucose, Bld: 79 mg/dL (ref 70–99)
Potassium: 5.2 meq/L — ABNORMAL HIGH (ref 3.5–5.1)
Sodium: 139 meq/L (ref 135–145)

## 2024-10-18 ENCOUNTER — Ambulatory Visit: Payer: Self-pay | Admitting: Family Medicine

## 2024-10-20 ENCOUNTER — Telehealth: Payer: Self-pay

## 2024-10-20 NOTE — Telephone Encounter (Signed)
 Patient has an appointment for 10/24/2024.

## 2024-10-20 NOTE — Telephone Encounter (Signed)
 Copied from CRM [id number]. Topic: Clinical - Lab/Test Results >> Oct 20, 2024 10:03 AM Aleatha C wrote: Reason for CRM: Patient has gotten her results back for her potassium and the Dr. Ask if she is taking supplements which she stated she is not, she would like a nurse to call her to further discuss how to handle this issue because she is very concerned with it being high, call back # [phone]

## 2024-10-22 ENCOUNTER — Telehealth: Payer: Self-pay | Admitting: Cardiology

## 2024-10-22 NOTE — Telephone Encounter (Signed)
 Spoke with pt. Pt wanted to make sure she could take her PRN Cardizem  with a high potassium level. Per PCP report potassium is 5.2. Told patient she could take that medication. Pt states she is having anxiety about lab results from PCP and Advised pt to make sure to keep her appointment for a EKG on Friday.

## 2024-10-22 NOTE — Telephone Encounter (Signed)
 Patient c/o Palpitations:  STAT if patient reporting lightheadedness, shortness of breath, or chest pain  How long have you had palpitations/irregular HR/ Afib? Are you having the symptoms now? No   Are you currently experiencing lightheadedness, SOB or CP? No   Do you have a history of afib (atrial fibrillation) or irregular heart rhythm? Yes   Have you checked your BP or HR? (document readings if available): no   Are you experiencing any other symptoms? Pt has been having palpitations on and off, no active symptoms at time of call. Saw her pcp on 1/5 and had elevated potassium. Pt asking if it is still okay to take her medicine when having palpitations. Please advise

## 2024-10-24 ENCOUNTER — Ambulatory Visit: Admitting: Family Medicine

## 2024-10-24 ENCOUNTER — Encounter: Payer: Self-pay | Admitting: Family Medicine

## 2024-10-24 VITALS — BP 118/88 | HR 93 | Temp 98.4°F | Resp 18 | Ht 66.0 in | Wt 143.6 lb

## 2024-10-24 DIAGNOSIS — E875 Hyperkalemia: Secondary | ICD-10-CM

## 2024-10-24 LAB — COMPREHENSIVE METABOLIC PANEL WITH GFR
ALT: 19 U/L (ref 3–35)
AST: 22 U/L (ref 5–37)
Albumin: 4.4 g/dL (ref 3.5–5.2)
Alkaline Phosphatase: 48 U/L (ref 39–117)
BUN: 18 mg/dL (ref 6–23)
CO2: 31 meq/L (ref 19–32)
Calcium: 10 mg/dL (ref 8.4–10.5)
Chloride: 107 meq/L (ref 96–112)
Creatinine, Ser: 1.05 mg/dL (ref 0.40–1.20)
GFR: 58.64 mL/min — ABNORMAL LOW
Glucose, Bld: 82 mg/dL (ref 70–99)
Potassium: 4.6 meq/L (ref 3.5–5.1)
Sodium: 144 meq/L (ref 135–145)
Total Bilirubin: 0.4 mg/dL (ref 0.2–1.2)
Total Protein: 7.6 g/dL (ref 6.0–8.3)

## 2024-10-24 NOTE — Patient Instructions (Signed)
Hyperkalemia Hyperkalemia occurs when the level of potassium in the blood is too high. Potassium is an important mineral (electrolyte) that helps the muscles and nerves function normally. It affects how the heart works, and it helps keep fluids and minerals balanced in the body. If there is too much potassium in your blood, it can affect your heart's ability to function normally. Potassium is normally removed (excreted) from the body by the kidneys. Hyperkalemia can result from various conditions. It can range from mild to severe. What are the causes? This condition may be caused by: Taking in too much potassium. This can happen if: You use salt substitutes. They contain large amounts of potassium. You take potassium supplements. You eat too many foods that are high in potassium if you have kidney disease. Excreting too little potassium. This can happen if: Your kidneys are not working properly. Kidney (renal) disease, including short-term or long-term renal failure, is a common cause of hyperkalemia. You are taking medicines that lower your excretion of potassium, such as ACE inhibitors, angiotensin II receptor blockers (ARBs), or potassium-sparing diuretics, such as spironolactone. You have Addison's disease. You have a urinary tract blockage, such as kidney stones. You are on treatment to mechanically clean your blood (dialysis) and you skip a treatment. Your cells releasing a high amount of potassium into the blood. This can happen with: Injury to muscles (rhabdomyolysis) or other tissues. Most potassium is stored in your muscles. Severe burns, injuries, or infections. Acidic blood plasma (acidosis). Acidosis can result from many diseases, such as uncontrolled diabetes. What increases the risk? You are more likely to develop this condition if you have alcoholism or if you use drugs heavily. What are the signs or symptoms? In many cases, there are no symptoms. However, when your potassium  level becomes high enough, you may have symptoms such as: An irregular or very slow heartbeat. Nausea. Tiredness (fatigue). Confusion. Tingling of your skin or numbness of your hands or feet. Muscle cramps. Muscle weakness. Not being able to move (paralysis). How is this diagnosed? This condition may be diagnosed based on: Your symptoms and medical history. Your health care provider will ask about your use of over-the-counter and prescription medicines. A physical exam. Blood tests. An electrocardiogram (ECG). How is this treated? Treatment depends on the cause and severity of your condition. Treatment may need to be done in the hospital setting. Treatment may include: Receiving a sugar solution (glucose) through an IV along with insulin to shift potassium out of your blood and into your cells. Taking a medicine called albuterol to shift potassium out of your blood and into your cells. Taking medicines to remove the potassium from your body. Having dialysis to remove the potassium from your body. Taking calcium to protect your heart from the effects of high potassium, such as irregular rhythms (arrhythmias). Follow these instructions at home:  Take over-the-counter and prescription medicines only as told by your health care provider. Do not take any supplements, natural food products, herbs, or vitamins without reviewing them with your health care provider. Certain supplements and natural food products contain high amounts of potassium. If you drink alcohol, limit how much you have as told by your health care provider. Do not use illegal drugs. If you need help quitting, ask your health care provider. If you have kidney disease, you may need to follow a low-potassium diet. A dietitian can help you learn which foods have high or low amounts of potassium. Keep all follow-up visits. This is important.  Contact a health care provider if: You have an irregular or very slow heartbeat. You  feel light-headed. You feel weak. You are nauseous. You have tingling or numbness in your hands or feet. Get help right away if: You have shortness of breath. You have chest pain or discomfort. You faint. You have muscle paralysis. These symptoms may be an emergency. Get help right away. Call 911. Do not wait to see if the symptoms will go away. Do not drive yourself to the hospital. Summary Hyperkalemia occurs when the level of potassium in your blood is too high. This condition may be caused by taking in too much potassium, excreting too little potassium, or releasing a high amount of potassium from your cells into your blood. Hyperkalemia can result from many underlying conditions, especially chronic kidney disease, or from taking certain medicines. Treatment of hyperkalemia may include medicine to shift potassium out of your blood and into your cells or to remove the potassium from your body. If you have kidney disease, you may need to follow a low-potassium diet. A dietitian can help you learn which foods have high or low amounts of potassium. This information is not intended to replace advice given to you by your health care provider. Make sure you discuss any questions you have with your health care provider. Document Revised: 06/09/2021 Document Reviewed: 06/09/2021 Elsevier Patient Education  2024 [employer].

## 2024-10-24 NOTE — Progress Notes (Signed)
 "  Subjective:    Patient ID: [name], female    DOB: Mar 03, 1966, 59 y.o.   MRN: [id number]  Chief Complaint  Patient presents with   Discuss Labs   Follow-up    Labs and potassium    HPI Patient is in today for f/u potassium.   Discussed the use of AI scribe software for clinical note transcription with the patient, who gave verbal consent to proceed.  History of Present Illness [name] is a 59 year old female who presents with stress-induced nausea and concerns about elevated potassium levels.  She has been experiencing nausea since Tuesday, which she attributes to stress over her potassium levels. She believes the stress is causing her stomach upset and nausea.  She mentions that her potassium level was slightly elevated, 'one point over,' which has caused her significant anxiety. She has been experiencing palpitations and dizziness, which she associates with her stress about the potassium levels. She has not been eating potatoes or bread, and she is drinking a lot of water, avoiding soda.  She recalls a fall that occurred while she was sitting in a chair, which resulted in back pain lasting about two weeks. She is concerned that this fall might have affected her kidneys, as she read online about potential kidney injury.  She is unsure about what to eat due to her stress and is concerned about potential diabetes or kidney disease, as she read about these conditions online.  No recent consumption of potatoes and bread.    Past Medical History:  Diagnosis Date   Anxiety    Breast cancer [facility]) 1999   2004   Fatigue    Headache    History of exercise stress test    GXT 9/19:  Exercised 5'48; METs: 7; Normal BP response; No ischemic EKG changes   Hyperlipidemia    Lightheadedness    Numbness    Tobacco abuse     Past Surgical History:  Procedure Laterality Date   ABDOMINAL HYSTERECTOMY  2006   TAH,BSO secondary bleeding and breast cancer--   BREAST SURGERY      CORNEAL TRANSPLANT Bilateral    6x, started in 1999, last was 2006.   EYE SURGERY     MASTECTOMY     bilateral---1999/2004    Family History  Problem Relation Age of Onset   Aortic aneurysm Mother    Stroke Sister 14       after starting OCP   Diabetes Sister    Breast cancer Sister    Hyperlipidemia Sister    Breast cancer Sister    Diabetes Mellitus I Sister    Multiple sclerosis Nephew    Aortic aneurysm Other    Hypertension Other    Seizures Neg Hx    Migraines Neg Hx     Social History   Socioeconomic History   Marital status: Single    Spouse name: Not on file   Number of children: 1   Years of education: 16   Highest education level: Not on file  Occupational History   Occupation: works for [employer]; office specialist  Tobacco Use   Smoking status: Some Days    Current packs/day: 0.25    Average packs/day: 0.3 packs/day for 27.0 years (6.8 ttl pk-yrs)    Types: Cigarettes   Smokeless tobacco: Never   Tobacco comments:    Trying to quit  Vaping Use   Vaping status: Never Used  Substance and Sexual Activity  Alcohol use: Yes    Alcohol/week: 0.0 standard drinks of alcohol    Comment: Rare   Drug use: No   Sexual activity: Not Currently  Other Topics Concern   Not on file  Social History Narrative   Marital status: single      Children: one daughter      Lives: with sister      Employment: works for [employer]; office specialist      Education: Pharmacologist      Tobacco: trying to quit      Alcohol: none; rare      Drugs: none      Exercise: walking daily 2 miles      Caffeine use: Tea/pepsi- 1 pepsi/morning      Tea- 3 cups per day   Right-handed   Social Drivers of Health   Tobacco Use: High Risk (10/24/2024)   Patient History    Smoking Tobacco Use: Some Days    Smokeless Tobacco Use: Never    Passive Exposure: Not on file  Financial Resource Strain: Not on file  Food Insecurity: Not on file   Transportation Needs: Not on file  Physical Activity: Not on file  Stress: Not on file  Social Connections: Not on file  Intimate Partner Violence: Not on file  Depression (PHQ2-9): Low Risk (02/05/2024)   Depression (PHQ2-9)    PHQ-2 Score: 0  Alcohol Screen: Not on file  Housing: Not on file  Utilities: Not on file  Health Literacy: Not on file    Outpatient Medications Prior to Visit  Medication Sig Dispense Refill   aluminum -magnesium  hydroxide-simethicone  (MAALOX) 200-200-20 MG/5ML SUSP Take 30 mLs by mouth 4 (four) times daily -  before meals and at bedtime. 1680 mL 0   atorvastatin  (LIPITOR) 20 MG tablet Take 1 tablet (20 mg total) by mouth daily.     diltiazem  (CARDIZEM ) 30 MG tablet Take 1 tablet (30 mg total) by mouth 3 (three) times daily as needed. 30 tablet 3   fenofibrate  160 MG tablet Take 1 tablet (160 mg total) by mouth daily. 90 tablet 1   metoprolol  succinate (TOPROL -XL) 50 MG 24 hr tablet Take 1 tablet (50 mg total) by mouth daily. Take with or immediately following a meal. 90 tablet 0   omeprazole  (PRILOSEC  OTC) 20 MG tablet Take 1 tablet (20 mg total) by mouth daily for 14 days. 14 tablet 0   ondansetron  (ZOFRAN -ODT) 4 MG disintegrating tablet Take 1 tablet (4 mg total) by mouth every 8 (eight) hours as needed for nausea or vomiting. 20 tablet 0   escitalopram  (LEXAPRO ) 10 MG tablet Take 1 tablet (10 mg total) by mouth daily. (Patient not taking: Reported on 10/24/2024) 30 tablet 2   pantoprazole  (PROTONIX ) 20 MG tablet Take 1 tablet (20 mg total) by mouth daily. (Patient not taking: Reported on 10/24/2024) 30 tablet 6   No facility-administered medications prior to visit.    Allergies[1]  Review of Systems  Constitutional:  Negative for fever and malaise/fatigue.  HENT:  Negative for congestion.   Eyes:  Negative for blurred vision.  Respiratory:  Negative for shortness of breath.   Cardiovascular:  Negative for chest pain, palpitations and leg swelling.   Gastrointestinal:  Negative for abdominal pain, blood in stool and nausea.  Genitourinary:  Negative for dysuria and frequency.  Musculoskeletal:  Negative for falls.  Skin:  Negative for rash.  Neurological:  Negative for dizziness, loss of consciousness and headaches.  Endo/Heme/Allergies:  Negative for environmental allergies.  Psychiatric/Behavioral:  Negative for depression. The patient is not nervous/anxious.        Objective:    Physical Exam Vitals and nursing note reviewed.  Constitutional:      General: She is not in acute distress.    Appearance: Normal appearance. She is well-developed.  HENT:     Head: Normocephalic and atraumatic.  Eyes:     General: No scleral icterus.       Right eye: No discharge.        Left eye: No discharge.  Cardiovascular:     Rate and Rhythm: Normal rate and regular rhythm.     Heart sounds: No murmur heard. Pulmonary:     Effort: Pulmonary effort is normal. No respiratory distress.     Breath sounds: Normal breath sounds.  Musculoskeletal:        General: Normal range of motion.     Cervical back: Normal range of motion and neck supple.     Right lower leg: No edema.     Left lower leg: No edema.  Skin:    General: Skin is warm and dry.  Neurological:     Mental Status: She is alert and oriented to person, place, and time.  Psychiatric:        Mood and Affect: Mood normal.        Behavior: Behavior normal.        Thought Content: Thought content normal.        Judgment: Judgment normal.     BP 118/88 (BP Location: Right Arm, Patient Position: Sitting, Cuff Size: Normal)   Pulse 93   Temp 98.4 F (36.9 C) (Oral)   Resp 18   Ht 5' 6 (1.676 m)   Wt 143 lb 9.6 oz (65.1 kg)   SpO2 97%   BMI 23.18 kg/m  Wt Readings from Last 3 Encounters:  10/24/24 143 lb 9.6 oz (65.1 kg)  10/10/24 146 lb 6.4 oz (66.4 kg)  05/29/24 141 lb 12.8 oz (64.3 kg)    Diabetic Foot Exam - Simple   No data filed    Lab Results  Component  Value Date   WBC 4.1 10/10/2024   HGB 12.3 10/10/2024   HCT 36.8 10/10/2024   PLT 250.0 10/10/2024   GLUCOSE 79 10/17/2024   CHOL 110 10/10/2024   TRIG 116.0 10/10/2024   HDL 29.10 (L) 10/10/2024   LDLDIRECT 90.0 03/17/2020   LDLCALC 57 10/10/2024   ALT 18 10/10/2024   AST 21 10/10/2024   NA 139 10/17/2024   K 5.2 No hemolysis seen (H) 10/17/2024   CL 103 10/17/2024   CREATININE 1.04 10/17/2024   BUN 22 10/17/2024   CO2 30 10/17/2024   TSH 0.69 10/10/2024   HGBA1C 5.6 12/02/2013   MICROALBUR 0.50 07/18/2013    Lab Results  Component Value Date   TSH 0.69 10/10/2024   Lab Results  Component Value Date   WBC 4.1 10/10/2024   HGB 12.3 10/10/2024   HCT 36.8 10/10/2024   MCV 89.2 10/10/2024   PLT 250.0 10/10/2024   Lab Results  Component Value Date   NA 139 10/17/2024   K 5.2 No hemolysis seen (H) 10/17/2024   CO2 30 10/17/2024   GLUCOSE 79 10/17/2024   BUN 22 10/17/2024   CREATININE 1.04 10/17/2024   BILITOT 0.4 10/10/2024   ALKPHOS 45 10/10/2024   AST 21 10/10/2024   ALT 18 10/10/2024   PROT 7.2 10/10/2024   ALBUMIN  4.2 10/10/2024   CALCIUM  10.0 10/17/2024   ANIONGAP 10 05/28/2024   GFR 59.33 (L) 10/17/2024   Lab Results  Component Value Date   CHOL 110 10/10/2024   Lab Results  Component Value Date   HDL 29.10 (L) 10/10/2024   Lab Results  Component Value Date   LDLCALC 57 10/10/2024   Lab Results  Component Value Date   TRIG 116.0 10/10/2024   Lab Results  Component Value Date   CHOLHDL 4 10/10/2024   Lab Results  Component Value Date   HGBA1C 5.6 12/02/2013       Assessment & Plan:  Serum potassium elevated -     EKG 12-Lead -     Comprehensive metabolic panel with GFR   Assessment and Plan Assessment & Plan Hyperkalemia   Mild hyperkalemia with slightly elevated potassium levels. Symptoms of nausea, dizziness, and palpitations are likely stress-related. EKG is normal, showing no acute cardiac effects. She reports no dietary  intake of high-potassium foods. Rechecked potassium levels today. Advised to avoid high-potassium foods like potatoes and bread.   Jamee JONELLE Shanks Chase, DO     [1]  Allergies Allergen Reactions   Penicillins Other (See Comments)    Childhood reaction   "

## 2024-10-27 ENCOUNTER — Ambulatory Visit: Payer: Self-pay | Admitting: Family Medicine

## 2024-10-28 ENCOUNTER — Telehealth: Payer: Self-pay

## 2024-10-28 NOTE — Telephone Encounter (Unsigned)
 Copied from CRM [phone] [phone]. Topic: General - Other >> Oct 21, 2024 12:56 PM [name] wrote: Reason for CRM: patient calling because she is lightheaded and has an upset stomach. She thinks it may have something to do with her high potassium and is asking for a call.

## 2024-12-04 ENCOUNTER — Ambulatory Visit: Admitting: Cardiology
